# Patient Record
Sex: Female | Born: 1940 | Race: Black or African American | Hispanic: No | Smoking: Never smoker
Health system: Southern US, Community
[De-identification: ages and names within clinical notes are randomized; demographics above are authoritative.]

## PROBLEM LIST (undated history)

## (undated) DIAGNOSIS — E785 Hyperlipidemia, unspecified: Secondary | ICD-10-CM

## (undated) DIAGNOSIS — E049 Nontoxic goiter, unspecified: Secondary | ICD-10-CM

## (undated) DIAGNOSIS — M25562 Pain in left knee: Secondary | ICD-10-CM

## (undated) DIAGNOSIS — G7100 Muscular dystrophy, unspecified: Secondary | ICD-10-CM

## (undated) DIAGNOSIS — G35 Multiple sclerosis: Secondary | ICD-10-CM

## (undated) DIAGNOSIS — F419 Anxiety disorder, unspecified: Secondary | ICD-10-CM

## (undated) DIAGNOSIS — M25561 Pain in right knee: Secondary | ICD-10-CM

## (undated) DIAGNOSIS — I639 Cerebral infarction, unspecified: Secondary | ICD-10-CM

## (undated) DIAGNOSIS — G8929 Other chronic pain: Secondary | ICD-10-CM

## (undated) DIAGNOSIS — I10 Essential (primary) hypertension: Secondary | ICD-10-CM

## (undated) DIAGNOSIS — R55 Syncope and collapse: Secondary | ICD-10-CM

## (undated) DIAGNOSIS — I219 Acute myocardial infarction, unspecified: Secondary | ICD-10-CM

## (undated) HISTORY — DX: Syncope and collapse: R55

## (undated) HISTORY — PX: ABDOMINAL HYSTERECTOMY: SHX81

---

## 2001-07-10 ENCOUNTER — Emergency Department (HOSPITAL_COMMUNITY): Admission: EM | Admit: 2001-07-10 | Discharge: 2001-07-10 | Payer: Self-pay | Admitting: Emergency Medicine

## 2001-07-10 ENCOUNTER — Encounter: Payer: Self-pay | Admitting: Emergency Medicine

## 2002-07-12 ENCOUNTER — Emergency Department (HOSPITAL_COMMUNITY): Admission: EM | Admit: 2002-07-12 | Discharge: 2002-07-12 | Payer: Self-pay | Admitting: Emergency Medicine

## 2002-12-25 ENCOUNTER — Emergency Department (HOSPITAL_COMMUNITY): Admission: EM | Admit: 2002-12-25 | Discharge: 2002-12-26 | Payer: Self-pay | Admitting: Emergency Medicine

## 2002-12-26 ENCOUNTER — Encounter: Payer: Self-pay | Admitting: Emergency Medicine

## 2003-02-26 ENCOUNTER — Encounter: Admission: RE | Admit: 2003-02-26 | Discharge: 2003-05-27 | Payer: Self-pay | Admitting: Internal Medicine

## 2003-03-21 ENCOUNTER — Emergency Department (HOSPITAL_COMMUNITY): Admission: EM | Admit: 2003-03-21 | Discharge: 2003-03-21 | Payer: Self-pay | Admitting: Emergency Medicine

## 2003-07-24 ENCOUNTER — Other Ambulatory Visit: Admission: RE | Admit: 2003-07-24 | Discharge: 2003-07-24 | Payer: Self-pay | Admitting: Family Medicine

## 2003-07-30 ENCOUNTER — Encounter: Admission: RE | Admit: 2003-07-30 | Discharge: 2003-07-30 | Payer: Self-pay | Admitting: Internal Medicine

## 2003-09-18 ENCOUNTER — Emergency Department (HOSPITAL_COMMUNITY): Admission: AD | Admit: 2003-09-18 | Discharge: 2003-09-18 | Payer: Self-pay | Admitting: Family Medicine

## 2003-09-30 ENCOUNTER — Encounter: Admission: RE | Admit: 2003-09-30 | Discharge: 2003-09-30 | Payer: Self-pay | Admitting: Family Medicine

## 2003-10-22 ENCOUNTER — Encounter: Admission: RE | Admit: 2003-10-22 | Discharge: 2003-10-22 | Payer: Self-pay | Admitting: Family Medicine

## 2003-11-06 ENCOUNTER — Emergency Department (HOSPITAL_COMMUNITY): Admission: EM | Admit: 2003-11-06 | Discharge: 2003-11-06 | Payer: Self-pay | Admitting: Family Medicine

## 2003-11-24 ENCOUNTER — Emergency Department (HOSPITAL_COMMUNITY): Admission: EM | Admit: 2003-11-24 | Discharge: 2003-11-25 | Payer: Self-pay | Admitting: Emergency Medicine

## 2003-11-26 ENCOUNTER — Ambulatory Visit (HOSPITAL_COMMUNITY): Admission: RE | Admit: 2003-11-26 | Discharge: 2003-11-26 | Payer: Self-pay | Admitting: Family Medicine

## 2003-11-26 ENCOUNTER — Encounter: Admission: RE | Admit: 2003-11-26 | Discharge: 2003-11-26 | Payer: Self-pay | Admitting: Family Medicine

## 2003-12-02 ENCOUNTER — Ambulatory Visit (HOSPITAL_BASED_OUTPATIENT_CLINIC_OR_DEPARTMENT_OTHER): Admission: RE | Admit: 2003-12-02 | Discharge: 2003-12-02 | Payer: Self-pay | Admitting: General Surgery

## 2003-12-12 ENCOUNTER — Ambulatory Visit (HOSPITAL_COMMUNITY): Admission: RE | Admit: 2003-12-12 | Discharge: 2003-12-12 | Payer: Self-pay | Admitting: Cardiology

## 2003-12-24 ENCOUNTER — Encounter: Admission: RE | Admit: 2003-12-24 | Discharge: 2003-12-24 | Payer: Self-pay | Admitting: Family Medicine

## 2004-01-29 ENCOUNTER — Encounter: Admission: RE | Admit: 2004-01-29 | Discharge: 2004-01-29 | Payer: Self-pay | Admitting: Sports Medicine

## 2004-02-05 ENCOUNTER — Ambulatory Visit (HOSPITAL_COMMUNITY): Admission: RE | Admit: 2004-02-05 | Discharge: 2004-02-05 | Payer: Self-pay | Admitting: Sports Medicine

## 2004-02-05 ENCOUNTER — Encounter: Admission: RE | Admit: 2004-02-05 | Discharge: 2004-05-05 | Payer: Self-pay | Admitting: Family Medicine

## 2004-02-11 ENCOUNTER — Encounter: Admission: RE | Admit: 2004-02-11 | Discharge: 2004-02-11 | Payer: Self-pay | Admitting: Family Medicine

## 2004-07-15 ENCOUNTER — Emergency Department (HOSPITAL_COMMUNITY): Admission: EM | Admit: 2004-07-15 | Discharge: 2004-07-16 | Payer: Self-pay | Admitting: Emergency Medicine

## 2004-10-15 ENCOUNTER — Encounter: Admission: RE | Admit: 2004-10-15 | Discharge: 2004-10-15 | Payer: Self-pay | Admitting: General Surgery

## 2004-10-18 ENCOUNTER — Ambulatory Visit (HOSPITAL_COMMUNITY): Admission: RE | Admit: 2004-10-18 | Discharge: 2004-10-18 | Payer: Self-pay | Admitting: General Surgery

## 2004-10-18 ENCOUNTER — Ambulatory Visit (HOSPITAL_BASED_OUTPATIENT_CLINIC_OR_DEPARTMENT_OTHER): Admission: RE | Admit: 2004-10-18 | Discharge: 2004-10-18 | Payer: Self-pay | Admitting: General Surgery

## 2005-04-14 ENCOUNTER — Emergency Department (HOSPITAL_COMMUNITY): Admission: EM | Admit: 2005-04-14 | Discharge: 2005-04-14 | Payer: Self-pay | Admitting: Family Medicine

## 2005-05-05 ENCOUNTER — Emergency Department (HOSPITAL_COMMUNITY): Admission: EM | Admit: 2005-05-05 | Discharge: 2005-05-05 | Payer: Self-pay | Admitting: Family Medicine

## 2005-05-06 ENCOUNTER — Encounter: Admission: RE | Admit: 2005-05-06 | Discharge: 2005-05-06 | Payer: Self-pay | Admitting: Family Medicine

## 2006-03-13 ENCOUNTER — Emergency Department (HOSPITAL_COMMUNITY): Admission: EM | Admit: 2006-03-13 | Discharge: 2006-03-13 | Payer: Self-pay | Admitting: Emergency Medicine

## 2006-04-10 ENCOUNTER — Emergency Department (HOSPITAL_COMMUNITY): Admission: EM | Admit: 2006-04-10 | Discharge: 2006-04-11 | Payer: Self-pay | Admitting: Emergency Medicine

## 2006-09-19 ENCOUNTER — Emergency Department (HOSPITAL_COMMUNITY): Admission: EM | Admit: 2006-09-19 | Discharge: 2006-09-19 | Payer: Self-pay | Admitting: Emergency Medicine

## 2006-11-09 DIAGNOSIS — E7800 Pure hypercholesterolemia, unspecified: Secondary | ICD-10-CM | POA: Insufficient documentation

## 2006-11-09 DIAGNOSIS — I10 Essential (primary) hypertension: Secondary | ICD-10-CM

## 2006-11-09 DIAGNOSIS — M129 Arthropathy, unspecified: Secondary | ICD-10-CM | POA: Insufficient documentation

## 2007-05-27 ENCOUNTER — Emergency Department (HOSPITAL_COMMUNITY): Admission: EM | Admit: 2007-05-27 | Discharge: 2007-05-27 | Payer: Self-pay | Admitting: Emergency Medicine

## 2011-06-24 LAB — URINALYSIS, ROUTINE W REFLEX MICROSCOPIC
Hgb urine dipstick: NEGATIVE
Nitrite: NEGATIVE
Protein, ur: NEGATIVE
Specific Gravity, Urine: 1.009
Urobilinogen, UA: 0.2

## 2011-06-24 LAB — CBC
HCT: 38.3
Platelets: 159
RDW: 15.3 — ABNORMAL HIGH

## 2011-06-24 LAB — I-STAT 8, (EC8 V) (CONVERTED LAB)
BUN: 9
Bicarbonate: 29.5 — ABNORMAL HIGH
Chloride: 104
Glucose, Bld: 227 — ABNORMAL HIGH
HCT: 42
Hemoglobin: 14.3
Operator id: 257131
Potassium: 3 — ABNORMAL LOW
Sodium: 140

## 2011-06-24 LAB — DIFFERENTIAL
Basophils Absolute: 0
Lymphocytes Relative: 40
Lymphs Abs: 2.4
Monocytes Absolute: 0.3
Neutro Abs: 3.2

## 2011-06-24 LAB — POCT I-STAT CREATININE: Creatinine, Ser: 0.7

## 2012-02-15 ENCOUNTER — Encounter (HOSPITAL_COMMUNITY): Payer: Self-pay | Admitting: *Deleted

## 2012-02-15 ENCOUNTER — Emergency Department (HOSPITAL_COMMUNITY): Payer: Medicare Other

## 2012-02-15 ENCOUNTER — Inpatient Hospital Stay (HOSPITAL_COMMUNITY)
Admission: EM | Admit: 2012-02-15 | Discharge: 2012-02-20 | DRG: 065 | Disposition: A | Discharge status: Skilled Nursing Facility | Payer: Medicare Other | Attending: Internal Medicine | Admitting: Internal Medicine

## 2012-02-15 DIAGNOSIS — R9431 Abnormal electrocardiogram [ECG] [EKG]: Secondary | ICD-10-CM | POA: Diagnosis present

## 2012-02-15 DIAGNOSIS — E079 Disorder of thyroid, unspecified: Secondary | ICD-10-CM | POA: Diagnosis present

## 2012-02-15 DIAGNOSIS — R531 Weakness: Secondary | ICD-10-CM | POA: Diagnosis present

## 2012-02-15 DIAGNOSIS — R5381 Other malaise: Secondary | ICD-10-CM

## 2012-02-15 DIAGNOSIS — I10 Essential (primary) hypertension: Secondary | ICD-10-CM

## 2012-02-15 DIAGNOSIS — I5032 Chronic diastolic (congestive) heart failure: Secondary | ICD-10-CM | POA: Diagnosis present

## 2012-02-15 DIAGNOSIS — I6350 Cerebral infarction due to unspecified occlusion or stenosis of unspecified cerebral artery: Principal | ICD-10-CM | POA: Diagnosis present

## 2012-02-15 DIAGNOSIS — E119 Type 2 diabetes mellitus without complications: Secondary | ICD-10-CM | POA: Diagnosis present

## 2012-02-15 DIAGNOSIS — G35 Multiple sclerosis: Secondary | ICD-10-CM | POA: Diagnosis present

## 2012-02-15 DIAGNOSIS — I509 Heart failure, unspecified: Secondary | ICD-10-CM | POA: Diagnosis present

## 2012-02-15 DIAGNOSIS — E7800 Pure hypercholesterolemia, unspecified: Secondary | ICD-10-CM | POA: Diagnosis present

## 2012-02-15 DIAGNOSIS — I161 Hypertensive emergency: Secondary | ICD-10-CM | POA: Diagnosis present

## 2012-02-15 DIAGNOSIS — E785 Hyperlipidemia, unspecified: Secondary | ICD-10-CM | POA: Diagnosis present

## 2012-02-15 DIAGNOSIS — E1165 Type 2 diabetes mellitus with hyperglycemia: Secondary | ICD-10-CM

## 2012-02-15 DIAGNOSIS — E875 Hyperkalemia: Secondary | ICD-10-CM | POA: Diagnosis present

## 2012-02-15 DIAGNOSIS — R5383 Other fatigue: Secondary | ICD-10-CM

## 2012-02-15 DIAGNOSIS — M129 Arthropathy, unspecified: Secondary | ICD-10-CM

## 2012-02-15 DIAGNOSIS — R600 Localized edema: Secondary | ICD-10-CM

## 2012-02-15 HISTORY — DX: Nontoxic goiter, unspecified: E04.9

## 2012-02-15 HISTORY — DX: Multiple sclerosis: G35

## 2012-02-15 HISTORY — DX: Essential (primary) hypertension: I10

## 2012-02-15 HISTORY — DX: Hyperlipidemia, unspecified: E78.5

## 2012-02-15 LAB — URINALYSIS, ROUTINE W REFLEX MICROSCOPIC
Bilirubin Urine: NEGATIVE
Glucose, UA: 500 mg/dL — AB
Ketones, ur: NEGATIVE mg/dL
Leukocytes, UA: NEGATIVE
Specific Gravity, Urine: 1.019 (ref 1.005–1.030)
pH: 8 (ref 5.0–8.0)

## 2012-02-15 LAB — POCT I-STAT TROPONIN I: Troponin i, poc: 0 ng/mL (ref 0.00–0.08)

## 2012-02-15 LAB — COMPREHENSIVE METABOLIC PANEL
ALT: 9 U/L (ref 0–35)
Alkaline Phosphatase: 60 U/L (ref 39–117)
CO2: 32 mEq/L (ref 19–32)
Calcium: 9.5 mg/dL (ref 8.4–10.5)
GFR calc Af Amer: >90 mL/min (ref >90)
GFR calc non Af Amer: 89 mL/min — ABNORMAL LOW (ref >90)
Glucose, Bld: 184 mg/dL — ABNORMAL HIGH (ref 70–99)
Potassium: 3.3 mEq/L — ABNORMAL LOW (ref 3.5–5.1)
Sodium: 140 mEq/L (ref 135–145)
Total Protein: 7.5 g/dL (ref 6.0–8.3)

## 2012-02-15 LAB — CBC
HCT: 40.1 % (ref 36.0–46.0)
Hemoglobin: 13.7 g/dL (ref 12.0–15.0)
MCHC: 34.2 g/dL (ref 30.0–36.0)
MCV: 84.4 fL (ref 78.0–100.0)
RBC: 4.75 MIL/uL (ref 3.87–5.11)
RDW: 13.6 % (ref 11.5–15.5)

## 2012-02-15 LAB — DIFFERENTIAL
Eosinophils Relative: 1 % (ref 0–5)
Lymphocytes Relative: 33 % (ref 12–46)
Lymphs Abs: 2.1 10*3/uL (ref 0.7–4.0)
Monocytes Absolute: 0.3 10*3/uL (ref 0.1–1.0)

## 2012-02-15 MED ORDER — SODIUM CHLORIDE 0.9 % IV BOLUS (SEPSIS)
500.0000 mL | Freq: Once | INTRAVENOUS | Status: AC
  Administered 2012-02-15: 500 mL via INTRAVENOUS

## 2012-02-15 MED ORDER — FUROSEMIDE 10 MG/ML IJ SOLN
40.0000 mg | Freq: Once | INTRAMUSCULAR | Status: AC
  Administered 2012-02-15: 40 mg via INTRAVENOUS
  Filled 2012-02-15: qty 4

## 2012-02-15 MED ORDER — HYDRALAZINE HCL 20 MG/ML IJ SOLN
10.0000 mg | Freq: Once | INTRAMUSCULAR | Status: AC
  Administered 2012-02-15: 10 mg via INTRAVENOUS
  Filled 2012-02-15: qty 0.5

## 2012-02-15 MED ORDER — NITROGLYCERIN 2 % TD OINT
0.5000 [in_us] | TOPICAL_OINTMENT | Freq: Once | TRANSDERMAL | Status: AC
  Administered 2012-02-15: 0.5 [in_us] via TOPICAL
  Filled 2012-02-15: qty 30

## 2012-02-15 NOTE — ED Notes (Signed)
Per ems: pt called from home. Pt is having right shoulder. ems states pt's right shoulder pain has been ongoing for 3 years. Pt states she was using her right arm and started to increase with pain. Pt does ambulate with walker. ems states pt was in a motorize wheelchair when they arrive. Alert and oriented x4

## 2012-02-15 NOTE — H&P (Addendum)
Chief Complaint:  Generalized weakness 1 day  HPI: 71 year old elderly female with a history multiple sclerosis and hypertension and diabetes who presents emergency Department with one-day history of generalized weakness. She normally walks pretty well but started getting weak last night and woke up this morning and didn't have the strength to walk. She checked her blood pressure yesterday and was over 190 which she says is high for her. She denies any headache. She denies any slurred speech. She denies any difficulty in moving her arms or legs. She denies any numbness or tingling anywhere. She denies any fevers, cough, chest pain, dysuria or any recent illnesses including nausea vomiting or diarrhea. She denies any vision changes. She denies any lower extremity edema.  Review of Systems:  Otherwise negative  Past Medical History: Past Medical History  Diagnosis Date  . Hypertension   . MS (multiple sclerosis)   . Diabetes mellitus    History reviewed. No pertinent past surgical history.  Medications: Prior to Admission medications   Medication Sig Start Date End Date Taking? Authorizing Provider  nebivolol (BYSTOLIC) 10 MG tablet Take 10 mg by mouth daily.   Yes Historical Provider, MD  potassium chloride (MICRO-K) 10 MEQ CR capsule Take 10 mEq by mouth 2 (two) times daily.   Yes Historical Provider, MD    Allergies:   Allergies  Allergen Reactions  . Sulfa Drugs Cross Reactors     dizziness    Social History:  reports that she has never smoked. She does not have any smokeless tobacco history on file. She reports that she does not drink alcohol or use illicit drugs.  Family History: History reviewed. No pertinent family history.  Physical Exam: Filed Vitals:   02/15/12 2033 02/15/12 2229 02/15/12 2231 02/15/12 2251  BP: 162/74 227/92 223/102 195/126  Pulse: 60 60 62 84  Temp:      TempSrc:      Resp: 16     SpO2: 97%      General appearance: alert, cooperative and  no distress Lungs: clear to auscultation bilaterally Heart: regular rate and rhythm, S1, S2 normal, no murmur, click, rub or gallop Abdomen: soft, non-tender; bowel sounds normal; no masses,  no organomegaly Extremities: extremities normal, atraumatic, no cyanosis or edema Pulses: 2+ and symmetric Skin: Skin color, texture, turgor normal. No rashes or lesions Neurologic: Grossly normal    Labs on Admission:   [facility] 02/15/12 2145  NA 140  K 3.3*  CL 100  CO2 32  GLUCOSE 184*  BUN 8  CREATININE 0.61  CALCIUM 9.5  MG --  PHOS --    Basename 02/15/12 2145  AST 16  ALT 9  ALKPHOS 60  BILITOT 0.3  PROT 7.5  ALBUMIN 3.5    Basename 02/15/12 2145  WBC 6.3  NEUTROABS 3.8  HGB 13.7  HCT 40.1  MCV 84.4  PLT 185    Radiological Exams on Admission: Dg Chest 2 View  02/15/2012  *RADIOLOGY REPORT*  Clinical Data: Weakness.  CHEST - 2 VIEW  Comparison: 04/10/2006.  Findings: The heart is mildly enlarged but stable.  There is tortuosity of the thoracic aorta.  The lungs are clear of acute process.  Mild chronic bronchitic changes.  No pleural effusion or pneumothorax.  The bony thorax is intact.  IMPRESSION: Mild cardiac enlargement, stable. Chronic bronchitic type changes but no acute pulmonary findings.  Original Report Authenticated By: P. Loralie Champagne, M.D.   Dg Shoulder Right  02/15/2012  *RADIOLOGY REPORT*  Clinical Data:  Right shoulder pain.  RIGHT SHOULDER - 2+ VIEW  Comparison: None  Findings: The joint spaces are maintained.  Mild glenohumeral and AC joint degenerative changes.  No acute fracture or abnormal soft tissue calcifications.  The right lung apex is clear.  The right upper ribs are intact.  IMPRESSION: Mild degenerative changes but no fracture or dislocation.  Original Report Authenticated By: P. Loralie Champagne, M.D.    Assessment/Plan Present on Admission:  71 year old female with generalized weakness with systolic blood pressures in the 240 range    .DIABETES MELLITUS II, UNCOMPLICATED .HYPERTENSION, BENIGN SYSTEMIC .Weakness generalized .Hypertensive emergency .HYPERCHOLESTEROLEMIA .Multiple sclerosis  She is being given a dose of Lasix, nitroglycerin paste, and hydralazine in emergency department in her systolic blood pressures are still over 220. Avoid increasing nitroglycerin paste 1 inch and also increase her when necessary hydralazine. I'm in a peripheral metoprolol 50 mg twice a day which can be titrated up. Will check a 2-D echo of her heart and serial her cardiac enzymes. Place on telemetry. EKG shows no acute changes. CT of her head is pending however she has no focal neurological deficits. We'll also obtain a physical therapy consultation and evaluation. She likely has some diastolic dysfunction and has been given a dose of Lasix we'll not continue Lasix as patient clinically is not in congestive heart failure at this time.   Lejla Moeser A [phone] 02/15/2012, 10:58 PM   Pt in ED after receiving 1/2# ntg paste, hydralazine 10 mg iv and lasix had a brief sudden drop in BP to 60-70 range with brief vasovagal response syncopal episode with quick increase of BP to over 150 range per ED RN update.  My plan initially was going to increase bp meds due to persistent sbp over 200 and dbp over 115.  Now with this sudden drop in bp will stop all BP meds ntg paste has been taken off chest wall in ED.  Apparently labile BP, will have to institute meds at lower dose and slow step wise fashion.  Ct head is still pending.

## 2012-02-15 NOTE — ED Notes (Signed)
Bed:WA17<BR> Expected date:<BR> Expected time:<BR> Means of arrival:<BR> Comments:<BR> EMS

## 2012-02-15 NOTE — ED Provider Notes (Addendum)
History     CSN: [id number]  Arrival date & time 02/15/12  Hannah Lamb   First MD Initiated Contact with Patient 02/15/12 1957      Chief Complaint  Patient presents with  . Shoulder Pain    (Consider location/radiation/quality/duration/timing/severity/associated sxs/prior treatment) HPI Pt presents with 1 day history of generalized weakness. States she began feeling unwell last night and this morning was weak. No fever, chills, CP, SOB, cough, abd pain, numbness, focal weakness, visual changes. + urinary frequency. + R shoulder pain which is unchanged for several years.  Past Medical History  Diagnosis Date  . Hypertension   . MS (multiple sclerosis)   . Diabetes mellitus     History reviewed. No pertinent past surgical history.  History reviewed. No pertinent family history.  History  Substance Use Topics  . Smoking status: Never Smoker   . Smokeless tobacco: Not on file  . Alcohol Use: No    OB History    Grav Para Term Preterm Abortions TAB SAB Ect Mult Living                  Review of Systems  Constitutional: Positive for fatigue. Negative for fever, chills and diaphoresis.  HENT: Negative for sore throat, neck pain and neck stiffness.   Eyes: Negative for pain and visual disturbance.  Respiratory: Negative for cough, shortness of breath and wheezing.   Cardiovascular: Positive for leg swelling. Negative for chest pain and palpitations.  Gastrointestinal: Negative for nausea, vomiting, abdominal pain, diarrhea and constipation.  Genitourinary: Positive for frequency. Negative for dysuria and flank pain.  Neurological: Positive for weakness. Negative for dizziness, light-headedness, numbness and headaches.    Allergies  Sulfa drugs cross reactors  Home Medications   Current Outpatient Rx  Name Route Sig Dispense Refill  . NEBIVOLOL HCL 10 MG PO TABS Oral Take 10 mg by mouth daily.    Marland Kitchen POTASSIUM CHLORIDE ER 10 MEQ PO CPCR Oral Take 10 mEq by mouth 2 (two)  times daily.      BP 195/126  Pulse 84  Temp(Src) 99.4 F (37.4 C) (Oral)  Resp 16  SpO2 97%  Physical Exam  Nursing note and vitals reviewed. Constitutional: She is oriented to person, place, and time. She appears well-developed and well-nourished. No distress.  HENT:  Head: Normocephalic and atraumatic.  Mouth/Throat: Oropharynx is clear and moist.  Eyes: EOM are normal. Pupils are equal, round, and reactive to light.  Neck: Normal range of motion. Neck supple.  Cardiovascular: Normal rate and regular rhythm.   Pulmonary/Chest: Effort normal and breath sounds normal. No respiratory distress. She has no wheezes. She has no rales. She exhibits no tenderness.  Abdominal: Soft. Bowel sounds are normal. There is no tenderness. There is no rebound and no guarding.  Musculoskeletal: Normal range of motion. She exhibits edema (1+ bl pitting edema).       Tenderness over shoulder joint and with ROM. Normal strength. No deformity or injury  Neurological: She is alert and oriented to person, place, and time.       5/5 motor in all ext, sensation intact.   Skin: Skin is warm and dry. No rash noted. No erythema.  Psychiatric: She has a normal mood and affect. Her behavior is normal.    ED Course  Procedures (including critical care time)  Labs Reviewed  COMPREHENSIVE METABOLIC PANEL - Abnormal; Notable for the following:    Potassium 3.3 (*)    Glucose, Bld 184 (*)  GFR calc non Af Amer 89 (*)    All other components within normal limits  URINALYSIS, ROUTINE W REFLEX MICROSCOPIC - Abnormal; Notable for the following:    Glucose, UA 500 (*)    All other components within normal limits  PRO B NATRIURETIC PEPTIDE - Abnormal; Notable for the following:    Pro B Natriuretic peptide (BNP) 1818.0 (*)    All other components within normal limits  CBC  DIFFERENTIAL  POCT I-STAT TROPONIN I   Dg Chest 2 View  02/15/2012  *RADIOLOGY REPORT*  Clinical Data: Weakness.  CHEST - 2 VIEW   Comparison: 04/10/2006.  Findings: The heart is mildly enlarged but stable.  There is tortuosity of the thoracic aorta.  The lungs are clear of acute process.  Mild chronic bronchitic changes.  No pleural effusion or pneumothorax.  The bony thorax is intact.  IMPRESSION: Mild cardiac enlargement, stable. Chronic bronchitic type changes but no acute pulmonary findings.  Original Report Authenticated By: P. Loralie Champagne, M.D.   Dg Shoulder Right  02/15/2012  *RADIOLOGY REPORT*  Clinical Data: Right shoulder pain.  RIGHT SHOULDER - 2+ VIEW  Comparison: None  Findings: The joint spaces are maintained.  Mild glenohumeral and AC joint degenerative changes.  No acute fracture or abnormal soft tissue calcifications.  The right lung apex is clear.  The right upper ribs are intact.  IMPRESSION: Mild degenerative changes but no fracture or dislocation.  Original Report Authenticated By: P. Loralie Champagne, M.D.     1. Uncontrolled hypertension   2. Pedal edema   3. Generalized weakness      Date: 02/15/2012  Rate: 61  Rhythm: normal sinus rhythm  QRS Axis: normal  Intervals: normal  ST/T Wave abnormalities: nonspecific T wave changes  Conduction Disutrbances:none  Narrative Interpretation:   Old EKG Reviewed: changes noted Poor quality EKG. +artifact   MDM    Discussed with [facility]. Would like to get stat CT head in ED and will admit to telemetry bed   Loren Racer, MD 02/15/12 2257   11:49 PM Pt with vagal episode in ED. BP dropped into 60's and HR into 50's. Pt has diuresed significant amount > 1 liter which may be the cause of the vagal episode. SBp back to 110 and pt mentating well. NTG removed from chest  Loren Racer, MD 02/15/12 [phone]

## 2012-02-16 ENCOUNTER — Inpatient Hospital Stay (HOSPITAL_COMMUNITY): Payer: Medicare Other

## 2012-02-16 ENCOUNTER — Encounter (HOSPITAL_COMMUNITY): Payer: Self-pay | Admitting: Cardiology

## 2012-02-16 DIAGNOSIS — R5383 Other fatigue: Secondary | ICD-10-CM

## 2012-02-16 DIAGNOSIS — R9431 Abnormal electrocardiogram [ECG] [EKG]: Secondary | ICD-10-CM

## 2012-02-16 DIAGNOSIS — E1165 Type 2 diabetes mellitus with hyperglycemia: Secondary | ICD-10-CM

## 2012-02-16 DIAGNOSIS — I10 Essential (primary) hypertension: Secondary | ICD-10-CM

## 2012-02-16 DIAGNOSIS — G35 Multiple sclerosis: Secondary | ICD-10-CM

## 2012-02-16 DIAGNOSIS — R55 Syncope and collapse: Secondary | ICD-10-CM

## 2012-02-16 DIAGNOSIS — R5381 Other malaise: Secondary | ICD-10-CM

## 2012-02-16 LAB — CBC
HCT: 42 % (ref 36.0–46.0)
MCV: 83.7 fL (ref 78.0–100.0)
RBC: 5.02 MIL/uL (ref 3.87–5.11)
WBC: 8.5 10*3/uL (ref 4.0–10.5)

## 2012-02-16 LAB — CARDIAC PANEL(CRET KIN+CKTOT+MB+TROPI)
CK, MB: 2.7 ng/mL (ref 0.3–4.0)
CK, MB: 2.4 ng/mL (ref 0.3–4.0)
Relative Index: 2.1 (ref 0.0–2.5)
Relative Index: 1.5 (ref 0.0–2.5)
Relative Index: 1.1 (ref 0.0–2.5)
Total CK: 130 U/L (ref 7–177)
Total CK: 165 U/L (ref 7–177)
Troponin I: <0.3 ng/mL
Troponin I: <0.3 ng/mL

## 2012-02-16 LAB — GLUCOSE, CAPILLARY
Glucose-Capillary: 254 mg/dL — ABNORMAL HIGH (ref 70–99)
Glucose-Capillary: 67 mg/dL — ABNORMAL LOW (ref 70–99)
Glucose-Capillary: 243 mg/dL — ABNORMAL HIGH (ref 70–99)
Glucose-Capillary: 251 mg/dL — ABNORMAL HIGH (ref 70–99)

## 2012-02-16 LAB — BASIC METABOLIC PANEL
CO2: 30 mEq/L (ref 19–32)
Chloride: 98 mEq/L (ref 96–112)
Sodium: 139 mEq/L (ref 135–145)

## 2012-02-16 LAB — POTASSIUM: Potassium: 3.5 mEq/L (ref 3.5–5.1)

## 2012-02-16 MED ORDER — INSULIN ASPART 100 UNIT/ML ~~LOC~~ SOLN
0.0000 [IU] | Freq: Three times a day (TID) | SUBCUTANEOUS | Status: DC
  Administered 2012-02-16: 7 [IU] via SUBCUTANEOUS
  Administered 2012-02-16: 5 [IU] via SUBCUTANEOUS

## 2012-02-16 MED ORDER — ASPIRIN 81 MG PO CHEW
81.0000 mg | CHEWABLE_TABLET | Freq: Once | ORAL | Status: DC
  Filled 2012-02-16: qty 1

## 2012-02-16 MED ORDER — GADOBENATE DIMEGLUMINE 529 MG/ML IV SOLN
17.0000 mL | Freq: Once | INTRAVENOUS | Status: AC | PRN
  Administered 2012-02-16: 17 mL via INTRAVENOUS

## 2012-02-16 MED ORDER — POTASSIUM CHLORIDE CRYS ER 20 MEQ PO TBCR
40.0000 meq | EXTENDED_RELEASE_TABLET | Freq: Once | ORAL | Status: AC
  Administered 2012-02-16: 40 meq via ORAL
  Filled 2012-02-16: qty 2

## 2012-02-16 MED ORDER — SODIUM CHLORIDE 0.9 % IJ SOLN: 3.0000 mL | Freq: Two times a day (BID) | INTRAMUSCULAR | Status: DC

## 2012-02-16 MED ORDER — SODIUM CHLORIDE 0.9 % IJ SOLN: 3.0000 mL | INTRAMUSCULAR | Status: DC | PRN

## 2012-02-16 MED ORDER — SODIUM CHLORIDE 0.9 % IV SOLN
250.0000 mL | INTRAVENOUS | Status: DC | PRN
  Administered 2012-02-16: 250 mL via INTRAVENOUS

## 2012-02-16 MED ORDER — NEBIVOLOL HCL 10 MG PO TABS
10.0000 mg | ORAL_TABLET | Freq: Every day | ORAL | Status: DC
  Administered 2012-02-16 – 2012-02-20 (×5): 10 mg via ORAL
  Filled 2012-02-16 (×5): qty 1

## 2012-02-16 MED ORDER — INSULIN ASPART PROT & ASPART (70-30 MIX) 100 UNIT/ML ~~LOC~~ SUSP
30.0000 [IU] | Freq: Two times a day (BID) | SUBCUTANEOUS | Status: DC
  Administered 2012-02-16: 30 [IU] via SUBCUTANEOUS
  Filled 2012-02-16: qty 10

## 2012-02-16 MED ORDER — INSULIN ASPART 100 UNIT/ML ~~LOC~~ SOLN
4.0000 [IU] | Freq: Once | SUBCUTANEOUS | Status: AC
  Administered 2012-02-17: 4 [IU] via SUBCUTANEOUS

## 2012-02-16 MED ORDER — POTASSIUM CHLORIDE CRYS ER 20 MEQ PO TBCR
20.0000 meq | EXTENDED_RELEASE_TABLET | Freq: Every day | ORAL | Status: DC
  Administered 2012-02-17 – 2012-02-20 (×4): 20 meq via ORAL
  Filled 2012-02-16 (×4): qty 1

## 2012-02-16 MED ORDER — ASPIRIN 81 MG PO CHEW
243.0000 mg | CHEWABLE_TABLET | Freq: Once | ORAL | Status: AC
  Administered 2012-02-16: 243 mg via ORAL
  Filled 2012-02-16: qty 3

## 2012-02-16 MED ORDER — SODIUM CHLORIDE 0.9 % IJ SOLN
3.0000 mL | Freq: Two times a day (BID) | INTRAMUSCULAR | Status: DC
  Administered 2012-02-16 – 2012-02-19 (×8): 3 mL via INTRAVENOUS

## 2012-02-16 MED ORDER — ASPIRIN EC 81 MG PO TBEC
81.0000 mg | DELAYED_RELEASE_TABLET | Freq: Every day | ORAL | Status: DC
  Administered 2012-02-16 – 2012-02-17 (×2): 81 mg via ORAL
  Filled 2012-02-16 (×2): qty 1

## 2012-02-16 MED ORDER — POTASSIUM CHLORIDE 10 MEQ/100ML IV SOLN
10.0000 meq | INTRAVENOUS | Status: AC
  Administered 2012-02-16 (×4): 10 meq via INTRAVENOUS
  Filled 2012-02-16 (×4): qty 100

## 2012-02-16 NOTE — Progress Notes (Signed)
Nutrition Note:    Pt triggered on the nutrition risk report due to difficulty chewing and swallowing.    Spoke with pt and daughters.  Pt usually ate healthy prior to admit.  Eats a lot of fruits and veges, usually does not use much salt, limits sugar/sweets.  Chewing problems per pt due to poor dentition.  Swallowing problems at times where food gets "hung up".    Eating well here.  Enjoying meals here with good po per pt.  Diet:  Heart Healthy  Chart reviewed.  Pt with hx MS, HTN, DM.  BMI 29.8  Daughter reported noted recent fluid retention and had questions regarding this.  Educated pt and daughters on heart healthy diabetic healthy eating.  Written info with Rd name and number provided.  No further questions at this time.    Continue current diet.  Encouraged ordering soft foods.  Monitor as needed throughout stay.  Oran Rein, RD [phone]

## 2012-02-16 NOTE — Consult Note (Signed)
Reason for Consult:Progressive generalized weakness Referring Physician: Thedore Mins  CC: Unable to do for herself  HPI: Hannah Lamb is an 71 y.o. female who reports that even as a youngster has always had some functional limitations.  Was unable to jump rope and stand for long periods of time.  Has had long standing difficulties with her right arm.  In 2008 was hospitalized and a work done at [facility] that included an LP led to a diagnosis of MS.  She was doing fairly well and due to age was not started on any disease modifying therapy.  In December had a syncopal episode that she describes as a prolonged apneic spell requiring CPR by her daughter.  After that event had worsening trouble with her RUE but did not seek medical attention.  She slowly improved and was able to ambulate wit ha walker and handle all of her ADL's including some light cooking.  In the 24 hours prior to presentation the patient became progressively weaker to the point that she was unable to ambulate or attend to her own self care.  Patient was brought in for evaluation at that time.  BP was elevated on initial presentation but has improved since that time.    Past Medical History  Diagnosis Date  . Hypertension   . MS (multiple sclerosis)   . Diabetes mellitus     History reviewed. No pertinent past surgical history.  History reviewed. No pertinent family history.  Social History:  reports that she has never smoked. She does not have any smokeless tobacco history on file. She reports that she does not drink alcohol or use illicit drugs.  Allergies  Allergen Reactions  . Sulfa Drugs Cross Reactors     dizziness    Medications:  I have reviewed the patient's current medications. Prior to Admission:  Prescriptions prior to admission  Medication Sig Dispense Refill  . insulin NPH-insulin regular (NOVOLIN 70/30) (70-30) 100 UNIT/ML injection Inject 45 Units into the skin daily with breakfast.      . insulin NPH-insulin  regular (NOVOLIN 70/30) (70-30) 100 UNIT/ML injection Inject 30 Units into the skin daily with supper.      . nebivolol (BYSTOLIC) 10 MG tablet Take 10 mg by mouth daily.      . potassium chloride (MICRO-K) 10 MEQ CR capsule Take 10 mEq by mouth 2 (two) times daily.       Scheduled:   . aspirin EC  81 mg Oral Daily  . furosemide  40 mg Intravenous Once  . hydrALAZINE  10 mg Intravenous Once  . insulin aspart  0-9 Units Subcutaneous TID WC  . insulin aspart protamine-insulin aspart  30 Units Subcutaneous BID WC  . nebivolol  10 mg Oral Daily  . nitroGLYCERIN  0.5 inch Topical Once  . potassium chloride  10 mEq Intravenous Q1 Hr x 4  . potassium chloride  20 mEq Oral Daily  . potassium chloride  40 mEq Oral Once  . sodium chloride  500 mL Intravenous Once  . sodium chloride  3 mL Intravenous Q12H  . DISCONTD: sodium chloride  3 mL Intravenous Q12H    ROS: History obtained from the patient  General ROS: negative for - chills, fatigue, fever, night sweats, weight gain or weight loss Psychological ROS: negative for - behavioral disorder, hallucinations, memory difficulties, mood swings or suicidal ideation Ophthalmic ROS: negative for - blurry vision, double vision, eye pain or loss of vision ENT ROS: negative for - epistaxis, nasal discharge,  oral lesions, sore throat, tinnitus or vertigo Allergy and Immunology ROS: negative for - hives or itchy/watery eyes Hematological and Lymphatic ROS: negative for - bleeding problems, bruising or swollen lymph nodes Endocrine ROS: negative for - galactorrhea, hair pattern changes, polydipsia/polyuria or temperature intolerance Respiratory ROS: negative for - cough, hemoptysis, shortness of breath or wheezing Cardiovascular ROS: negative for - chest pain, dyspnea on exertion, edema or irregular heartbeat Gastrointestinal ROS: negative for - abdominal pain, diarrhea, hematemesis, nausea/vomiting or stool incontinence Genito-Urinary ROS: negative for  - dysuria, hematuria, incontinence or urinary frequency/urgency Musculoskeletal ROS: negative for - joint swelling or muscular weakness Neurological ROS: as noted in HPI Dermatological ROS: negative for rash and skin lesion changes  Physical Examination: Blood pressure 145/75, pulse 71, temperature 98.5 F (36.9 C), temperature source Oral, resp. rate 20, height 5\' 7"  (1.702 m), weight 86.1 kg (189 lb 13.1 oz), SpO2 98.00%.  Neurologic Examination Mental Status: Alert, oriented, tangential.  Speech fluent without evidence of aphasia.  Able to follow 3 step commands without difficulty. Cranial Nerves: II: visual fields grossly normal, pupils equal, round, reactive to light and accommodation III,IV, VI: ptosis not present, extra-ocular motions intact bilaterally V,VII: smile symmetric, facial light touch sensation normal bilaterally VIII: hearing normal bilaterally IX,X: gag reflex present XI: trapezius strength/neck flexion strength normal bilaterally XII: tongue strength normal  Motor: Right : Upper extremity  Decreased ROM at shoulder w/3-4/5 hand grip   Left:     Upper extremity   5/5  Lower extremity   5-/5          Lower extremity   5-/5 Tone and bulk:normal tone throughout; no atrophy noted Sensory: Pinprick and light touch intact throughout, bilaterally Deep Tendon Reflexes: 2+ in the upper extremities, trace at the knees and absent at the ankles Plantars: Right: equivocal   Left: downgoing Cerebellar: normal finger-to-nose and normal heel-to-shin test  Results for orders placed during the hospital encounter of 02/15/12 (from the past 48 hour(s))  CBC     Status: Normal   Collection Time   02/15/12  9:45 PM      Component Value Range Comment   WBC 6.3  4.0 - 10.5 (K/uL)    RBC 4.75  3.87 - 5.11 (MIL/uL)    Hemoglobin 13.7  12.0 - 15.0 (g/dL)    HCT 36.6  44.0 - 34.7 (%)    MCV 84.4  78.0 - 100.0 (fL)    MCH 28.8  26.0 - 34.0 (pg)    MCHC 34.2  30.0 - 36.0 (g/dL)    RDW  42.5  95.6 - 38.7 (%)    Platelets 185  150 - 400 (K/uL)   DIFFERENTIAL     Status: Normal   Collection Time   02/15/12  9:45 PM      Component Value Range Comment   Neutrophils Relative 61  43 - 77 (%)    Neutro Abs 3.8  1.7 - 7.7 (K/uL)    Lymphocytes Relative 33  12 - 46 (%)    Lymphs Abs 2.1  0.7 - 4.0 (K/uL)    Monocytes Relative 5  3 - 12 (%)    Monocytes Absolute 0.3  0.1 - 1.0 (K/uL)    Eosinophils Relative 1  0 - 5 (%)    Eosinophils Absolute 0.1  0.0 - 0.7 (K/uL)    Basophils Relative 0  0 - 1 (%)    Basophils Absolute 0.0  0.0 - 0.1 (K/uL)   COMPREHENSIVE METABOLIC PANEL  Status: Abnormal   Collection Time   02/15/12  9:45 PM      Component Value Range Comment   Sodium 140  135 - 145 (mEq/L)    Potassium 3.3 (*) 3.5 - 5.1 (mEq/L)    Chloride 100  96 - 112 (mEq/L)    CO2 32  19 - 32 (mEq/L)    Glucose, Bld 184 (*) 70 - 99 (mg/dL)    BUN 8  6 - 23 (mg/dL)    Creatinine, Ser 0.86  0.50 - 1.10 (mg/dL)    Calcium 9.5  8.4 - 10.5 (mg/dL)    Total Protein 7.5  6.0 - 8.3 (g/dL)    Albumin 3.5  3.5 - 5.2 (g/dL)    AST 16  0 - 37 (U/L)    ALT 9  0 - 35 (U/L)    Alkaline Phosphatase 60  39 - 117 (U/L)    Total Bilirubin 0.3  0.3 - 1.2 (mg/dL)    GFR calc non Af Amer 89 (*) >90 (mL/min)    GFR calc Af Amer >90  >90 (mL/min)   PRO B NATRIURETIC PEPTIDE     Status: Abnormal   Collection Time   02/15/12  9:45 PM      Component Value Range Comment   Pro B Natriuretic peptide (BNP) 1818.0 (*) 0 - 125 (pg/mL)   URINALYSIS, ROUTINE W REFLEX MICROSCOPIC     Status: Abnormal   Collection Time   02/15/12  9:54 PM      Component Value Range Comment   Color, Urine YELLOW  YELLOW     APPearance CLEAR  CLEAR     Specific Gravity, Urine 1.019  1.005 - 1.030     pH 8.0  5.0 - 8.0     Glucose, UA 500 (*) NEGATIVE (mg/dL)    Hgb urine dipstick NEGATIVE  NEGATIVE     Bilirubin Urine NEGATIVE  NEGATIVE     Ketones, ur NEGATIVE  NEGATIVE (mg/dL)    Protein, ur NEGATIVE  NEGATIVE (mg/dL)     Urobilinogen, UA 0.2  0.0 - 1.0 (mg/dL)    Nitrite NEGATIVE  NEGATIVE     Leukocytes, UA NEGATIVE  NEGATIVE  MICROSCOPIC NOT DONE ON URINES WITH NEGATIVE PROTEIN, BLOOD, LEUKOCYTES, NITRITE, OR GLUCOSE <1000 mg/dL.  POCT I-STAT TROPONIN I     Status: Normal   Collection Time   02/15/12  9:55 PM      Component Value Range Comment   Troponin i, poc 0.00  0.00 - 0.08 (ng/mL)    Comment 3            CARDIAC PANEL(CRET KIN+CKTOT+MB+TROPI)     Status: Normal   Collection Time   02/16/12  2:00 AM      Component Value Range Comment   Total CK 130  7 - 177 (U/L)    CK, MB 2.7  0.3 - 4.0 (ng/mL)    Troponin I <0.30  <0.30 (ng/mL)    Relative Index 2.1  0.0 - 2.5    BASIC METABOLIC PANEL     Status: Abnormal   Collection Time   02/16/12  2:00 AM      Component Value Range Comment   Sodium 139  135 - 145 (mEq/L)    Potassium 2.6 (*) 3.5 - 5.1 (mEq/L)    Chloride 98  96 - 112 (mEq/L)    CO2 30  19 - 32 (mEq/L)    Glucose, Bld 215 (*) 70 - 99 (mg/dL)  BUN 7  6 - 23 (mg/dL)    Creatinine, Ser 6.96  0.50 - 1.10 (mg/dL)    Calcium 9.0  8.4 - 10.5 (mg/dL)    GFR calc non Af Amer 84 (*) >90 (mL/min)    GFR calc Af Amer >90  >90 (mL/min)   CBC     Status: Normal   Collection Time   02/16/12  2:00 AM      Component Value Range Comment   WBC 8.5  4.0 - 10.5 (K/uL)    RBC 5.02  3.87 - 5.11 (MIL/uL)    Hemoglobin 14.4  12.0 - 15.0 (g/dL)    HCT 29.5  28.4 - 13.2 (%)    MCV 83.7  78.0 - 100.0 (fL)    MCH 28.7  26.0 - 34.0 (pg)    MCHC 34.3  30.0 - 36.0 (g/dL)    RDW 44.0  10.2 - 72.5 (%)    Platelets 174  150 - 400 (K/uL)   MAGNESIUM     Status: Normal   Collection Time   02/16/12  2:00 AM      Component Value Range Comment   Magnesium 1.5  1.5 - 2.5 (mg/dL)   GLUCOSE, CAPILLARY     Status: Abnormal   Collection Time   02/16/12  7:48 AM      Component Value Range Comment   Glucose-Capillary 341 (*) 70 - 99 (mg/dL)   CARDIAC PANEL(CRET KIN+CKTOT+MB+TROPI)     Status: Normal   Collection Time    02/16/12  9:59 AM      Component Value Range Comment   Total CK 165  7 - 177 (U/L)    CK, MB 2.4  0.3 - 4.0 (ng/mL)    Troponin I <0.30  <0.30 (ng/mL)    Relative Index 1.5  0.0 - 2.5      No results found for this or any previous visit (from the past 240 hour(s)).  Dg Chest 2 View  02/15/2012  *RADIOLOGY REPORT*  Clinical Data: Weakness.  CHEST - 2 VIEW  Comparison: 04/10/2006.  Findings: The heart is mildly enlarged but stable.  There is tortuosity of the thoracic aorta.  The lungs are clear of acute process.  Mild chronic bronchitic changes.  No pleural effusion or pneumothorax.  The bony thorax is intact.  IMPRESSION: Mild cardiac enlargement, stable. Chronic bronchitic type changes but no acute pulmonary findings.  Original Report Authenticated By: P. Loralie Champagne, M.D.   Dg Shoulder Right  02/15/2012  *RADIOLOGY REPORT*  Clinical Data: Right shoulder pain.  RIGHT SHOULDER - 2+ VIEW  Comparison: None  Findings: The joint spaces are maintained.  Mild glenohumeral and AC joint degenerative changes.  No acute fracture or abnormal soft tissue calcifications.  The right lung apex is clear.  The right upper ribs are intact.  IMPRESSION: Mild degenerative changes but no fracture or dislocation.  Original Report Authenticated By: P. Loralie Champagne, M.D.   Ct Head Wo Contrast  02/16/2012  *RADIOLOGY REPORT*  Clinical Data: Weakness.  CT HEAD WITHOUT CONTRAST  Technique:  Contiguous axial images were obtained from the base of the skull through the vertex without contrast.  Comparison: Head CT 05/27/2007.  Brain MRI 05/27/2007.  Findings: The there are multiple well defined foci of low attenuation throughout the basal ganglia bilaterally, similar to the prior study from 05/27/2007, compatible with old lacunar infarctions.  Mild cerebral and cerebellar atrophy is noted.  Old cerebellar infarctions in the right cerebellar hemisphere are also noted.  There are extensive patchy and confluent areas of decreased  attenuation throughout the deep and periventricular white matter of the cerebral hemispheres bilaterally, consistent with chronic microvascular ischemic disease.  No definite acute intracranial abnormalities.  Specifically, no overt signs of acute/subacute cerebral ischemia, no acute intracranial hemorrhage, no focal mass, mass effect or hydrocephalous.  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  No acute displaced skull fractures are identified.  Visualized paranasal sinuses and mastoids are well pneumatized.  IMPRESSION: 1.  No acute intracranial abnormalities. 2.  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  In addition, there is extensive chronic microvascular ischemic disease of the white matter. 3.  Mild cerebral and cerebellar atrophy.  Original Report Authenticated By: Florencia Reasons, M.D.     Assessment/Plan:  Patient Active Hospital Problem List:  Weakness generalized (02/15/2012)   Assessment: Unclear etiology for weakness.  Patient feels it is more pronounced on the right although this is not particularly apparent on examination.  Further work up indicated.     Plan:  1.  MRI of the brain and cervical spine with and without contrast. Multiple sclerosis (02/15/2012)   Assessment: Unclear if progression at this time.  Based on results of above imaging will determine need for steroids.     Plan: F/u results   Thana Farr, MD [facility] [phone] 02/16/2012, 11:49 AM

## 2012-02-16 NOTE — Progress Notes (Signed)
Inpatient Diabetes Program Recommendations  AACE/ADA: New Consensus Statement on Inpatient Glycemic Control (2009)  Target Ranges:  Prepandial:   less than 140 mg/dL      Peak postprandial:   less than 180 mg/dL (1-2 hours)      Critically ill patients:  140 - 180 mg/dL   Reason for Visit: Elevated CBG's and referral.  Saw patient and daughters.  According to RN they were concerned that patient was on too much insulin prior to admit.  Patient's A1C=10.5% indicating sub-optimal control prior to admission. Shared this information with patient and family.  Radiology came to take patient to MRI.  Daughters would like coordinator to come by tomorrow.  Briefly was able to discuss 70/30 insulin regimen and the importance of eating consistently.  RN also stated that patient is weary of bing on Novolog insulin b/c of "tingling in fingers" she experienced in past.  She is not having these effects today.  Will follow.  CBG down to 254 mg/dL at lunch.

## 2012-02-16 NOTE — Progress Notes (Signed)
I have directly reviewed the clinical findings, lab, imaging studies and management of this patient in detail. I have interviewed and examined the patient and agree with the documentation,  as recorded by the Physician extender.  Acute on chronic generalized weakness worse in lower extremities, could be MS flareup, PT OT and neurology requested to see the patient.  Hypokalemia replace aggressively recheck potassium and magnesium later today.  EKG changes upon admission, ruled out for MI, and a new beta blocker and aspirin obtain echo and cardiology input.  Diabetes mellitus resume home dose insulin along with sliding scale.  Episode of syncope in the ER due to rapid reduction of blood pressure tended to multiple medications, PT symptom-free now blood pressure medications have been adjusted blood pressure is stable.   Leroy Sea M.D on 02/16/2012 at 1:16 PM  [facility] Office  [phone]

## 2012-02-16 NOTE — Progress Notes (Signed)
CBG: 67 at 1811  Treatment: Dinner tray given to pt at this time  Symptoms: none  Follow-up CBG: Time:1856 CBG Result 183  Possible Reasons for Event: Insulin Regimen; pt on SSI TID with meals and Novolog 70/30 BID  Comments/MD notified: Dr. Thedore Mins notified and orders given to d/c SSI and to hold evening dose of 70/30 and to check CBG after pt eats dinner and to check CBG Q2hour x4.      Arta [facility]

## 2012-02-16 NOTE — Evaluation (Signed)
Physical Therapy Evaluation Patient Details Name: Hannah Lamb MRN: [id number] DOB: [birth year] Today's Date: 02/16/2012 Time: 3086-5784 PT Time Calculation (min): 49 min  PT Assessment / Plan / Recommendation Clinical Impression  71 yo female admitted with Hypertensive crisis. Pt also has hx of MS with possibiltiy of acute MS exacerbation. Pt demonstrates general weakness in addition to balance, perceptual awareness deficits. Pt demonstrates high risk for falls due to weakness and inability to self-correct posture, righting. Discussed D/c options with family. Feel pt could benefit greatly from CIR consult and continued therapy. Family does not want to place pt in skilled nursing facility. If CIR not an option, then family wishes to take pt home.     PT Assessment  Patient needs continued PT services    Follow Up Recommendations  Inpatient Rehab    Barriers to Discharge        lEquipment Recommendations   (to be determined)    Recommendations for Other Services OT consult   Frequency Min 3X/week    Precautions / Restrictions Precautions Precautions: Fall Precaution Comments: R shoulder deficits PTA. Family present and requested limited pulling up on R UE when mobilizing pt Restrictions Weight Bearing Restrictions: No   Pertinent Vitals/Pain       Mobility  Bed Mobility Bed Mobility: Supine to Sit Supine to Sit: 1: +2 Total assist;HOB flat Supine to Sit: Patient Percentage: 40% Details for Bed Mobility Assistance: Assist for bil LEs off bed and trunk to upright. Increased time. Repeated cueing for initiation and completion of task. Pt easily distracted at times.  Transfers Transfers: Sit to Stand;Stand to Sit Sit to Stand: 1: +2 Total assist;From bed;From elevated surface Sit to Stand: Patient Percentage: 50% Stand to Sit: 1: +2 Total assist;To chair/3-in-1;With upper extremity assist Stand to Sit: Patient Percentage: 50% Details for Transfer Assistance: VCs safety,  technique, hand placement. Assist to rise, stabilize, control dsecent. Sat EOB for 2-3 minutes before able to attempt transfer due to pt leaning moderately posteriorly and requiring assist and Max cues for correct posture Ambulation/Gait Ambulation/Gait Assistance: 1: +2 Total assist Ambulation/Gait: Patient Percentage: 50% Ambulation Distance (Feet): 6 Feet Assistive device: Rolling walker Ambulation/Gait Assistance Details: Multimodal cues for safety, technique, posture, sequencing. Assist to stabilize and keep pt in correct posture/alignment. Pt leaning moderately to L side with head tilted to L as well. Pt with difficulty advancing L LE. Fatgiues easily. Towards end of walk, when advancing R LE, pt appeared to overshoot step. Gait Pattern: Step-to pattern;Lateral trunk lean to left;Ataxic;Decreased stride length    Exercises     PT Diagnosis: Difficulty walking;Abnormality of gait;Generalized weakness  PT Problem List: Decreased strength;Decreased activity tolerance;Decreased balance;Decreased mobility;Decreased knowledge of use of DME;Decreased coordination PT Treatment Interventions: DME instruction;Gait training;Functional mobility training;Therapeutic activities;Therapeutic exercise;Balance training;Patient/family education   PT Goals Acute Rehab PT Goals PT Goal Formulation: With patient/family Time For Goal Achievement: 03/01/12 Potential to Achieve Goals: Good Pt will go Supine/Side to Sit: with min assist PT Goal: Supine/Side to Sit - Progress: Goal set today Pt will go Sit to Supine/Side: with min assist PT Goal: Sit to Supine/Side - Progress: Goal set today Pt will go Sit to Stand: with min assist PT Goal: Sit to Stand - Progress: Goal set today Pt will Transfer Bed to Chair/Chair to Bed: with min assist PT Transfer Goal: Bed to Chair/Chair to Bed - Progress: Goal set today Pt will Ambulate: 51 - 150 feet;with min assist;with least restrictive assistive device PT Goal:  Ambulate - Progress:  Goal set today  Visit Information  Last PT Received On: 02/16/12 Assistance Needed: +2    Subjective Data  Subjective: "i was doing everything for myself" Patient Stated Goal: Get better. Home   Prior Functioning  Home Living Lives With: Daughter Available Help at Discharge: Family Home Adaptive Equipment: Walker - rolling;Shower chair with back Academic librarian) Prior Function Level of Independence: Independent with assistive device(s) Comments: using RW at baseline without assistance Communication Communication: No difficulties    Cognition  Overall Cognitive Status: Impaired Area of Impairment: Attention;Problem solving;Awareness of errors Arousal/Alertness: Awake/alert Behavior During Session: [facility] for tasks performed Current Attention Level: Alternating Awareness of Errors: Assistance required to correct errors made;Assistance required to identify errors made Cognition - Other Comments: Increased time for processing    Extremity/Trunk Assessment Right Lower Extremity Assessment RLE ROM/Strength/Tone: Deficits RLE ROM/Strength/Tone Deficits: Strength at least 4/5 throughout RLE Sensation: WFL - Light Touch RLE Coordination: Deficits Left Lower Extremity Assessment LLE ROM/Strength/Tone: Deficits LLE ROM/Strength/Tone Deficits: Strength at least 3+/5 throughout LLE Sensation: WFL - Light Touch LLE Coordination: Deficits Trunk Assessment Trunk Assessment: Other exceptions Trunk Exceptions: Head tilted laterally in sitting/standing. Multimodal cues for correction-pt did not appear to be able to correct on her own.    Balance Balance Balance Assessed: Yes Static Sitting Balance Static Sitting - Balance Support: Bilateral upper extremity supported Static Sitting - Level of Assistance: 3: Mod assist;4: Min assist Static Sitting - Comment/# of Minutes: Sat EOB 2-3 minutes working on correction of head/trunk posturing. Pt leaning posteriorly and laterally  to L. Had pt work on anterior weightshifting by sliding bil hands down legs then returning to full upright. Pt unable to gauge correct posture-retuned to posterior leaning each time. Therapist positioned her body in front of pt to give visual cue for right alignment.  Static Standing Balance Static Standing - Balance Support: Bilateral upper extremity supported Static Standing - Level of Assistance: 1: +2 Total assist Static Standing - Comment/# of Minutes: Pt=50%. Stood statically for 1-2 minutes, again working on alignment. Pt stated she felt she was falling backwards, even through she was not. Poor perceptual awareness of body in space.   End of Session PT - End of Session Equipment Utilized During Treatment: Gait belt Activity Tolerance: Patient tolerated treatment well Patient left: in chair;with call bell/phone within reach;with family/visitor present   Rebeca Alert [facility] 02/16/2012, 4:01 PM [phone]

## 2012-02-16 NOTE — Progress Notes (Signed)
Subjective: Awake alert. Reports feeling better than yesterday. Denied pain/discomfort. Family at bedside.   Objective: Vital signs Filed Vitals:   02/15/12 2330 02/15/12 2345 02/16/12 0102 02/16/12 0501  BP: 199/97 103/58 184/82 145/75  Pulse:  66 64 71  Temp:   98.2 F (36.8 C) 98.5 F (36.9 C)  TempSrc:   Oral Oral  Resp:  21 20 20   Height:   5\' 7"  (1.702 m)   Weight:   86.1 kg (189 lb 13.1 oz)   SpO2:  96% 98% 98%   Weight change:  Last BM Date: 02/16/12  Intake/Output from previous day: 06/05 0701 - 06/06 0700 In: 344 [P.O.:240; IV Piggyback:104] Out: 2276 [Urine:2275; Stool:1] Total I/O In: 360 [P.O.:360] Out: -    Physical Exam: General: Alert, awake, oriented x3, in no acute distress. Well nourished HEENT: No bruits, no goiter. Normocephalic, PERRL, mucus membranes mouth moist/pink.  Heart: Regular rate and rhythm, without murmurs, rubs, gallops. Trace LEE PPP Lungs:  Normal effort. Breath sounds clear to auscultation bilaterally. No wheeze, rhonchi Abdomen: Soft, nontender, nondistended, positive bowel sounds. Extremities: No clubbing cyanosis with positive pedal pulses. Neuro: Grossly intact, nonfocal. Speech slow but clear. Cranial nerve II-XII intact.     Lab Results: Basic Metabolic Panel:  Basename 02/16/12 0200 02/15/12 2145  NA 139 140  K 2.6* 3.3*  CL 98 100  CO2 30 32  GLUCOSE 215* 184*  BUN 7 8  CREATININE 0.73 0.61  CALCIUM 9.0 9.5  MG 1.5 --  PHOS -- --   Liver Function Tests:  [facility] 02/15/12 2145  AST 16  ALT 9  ALKPHOS 60  BILITOT 0.3  PROT 7.5  ALBUMIN 3.5   No results found for this basename: LIPASE:2,AMYLASE:2 in the last 72 hours No results found for this basename: AMMONIA:2 in the last 72 hours CBC:  Basename 02/16/12 0200 02/15/12 2145  WBC 8.5 6.3  NEUTROABS -- 3.8  HGB 14.4 13.7  HCT 42.0 40.1  MCV 83.7 84.4  PLT 174 185   Cardiac Enzymes:  Basename 02/16/12 0959 02/16/12 0200  CKTOTAL 165 130  CKMB  2.4 2.7  CKMBINDEX -- --  TROPONINI <0.30 <0.30   BNP:  Basename 02/15/12 2145  PROBNP 1818.0*   D-Dimer: No results found for this basename: DDIMER:2 in the last 72 hours CBG:  Basename 02/16/12 1126 02/16/12 0748  GLUCAP 254* 341*   Hemoglobin A1C: No results found for this basename: HGBA1C in the last 72 hours Fasting Lipid Panel: No results found for this basename: CHOL,HDL,LDLCALC,TRIG,CHOLHDL,LDLDIRECT in the last 72 hours Thyroid Function Tests: No results found for this basename: TSH,T4TOTAL,FREET4,T3FREE,THYROIDAB in the last 72 hours Anemia Panel: No results found for this basename: VITAMINB12,FOLATE,FERRITIN,TIBC,IRON,RETICCTPCT in the last 72 hours Coagulation: No results found for this basename: LABPROT:2,INR:2 in the last 72 hours Urine Drug Screen: Drugs of Abuse  No results found for this basename: labopia, cocainscrnur, labbenz, amphetmu, thcu, labbarb    Alcohol Level: No results found for this basename: ETH:2 in the last 72 hours Urinalysis:  Basename 02/15/12 2154  COLORURINE YELLOW  LABSPEC 1.019  PHURINE 8.0  GLUCOSEU 500*  HGBUR NEGATIVE  BILIRUBINUR NEGATIVE  KETONESUR NEGATIVE  PROTEINUR NEGATIVE  UROBILINOGEN 0.2  NITRITE NEGATIVE  LEUKOCYTESUR NEGATIVE   Misc. Labs:  No results found for this or any previous visit (from the past 240 hour(s)).  Studies/Results: Dg Chest 2 View  02/15/2012  *RADIOLOGY REPORT*  Clinical Data: Weakness.  CHEST - 2 VIEW  Comparison: 04/10/2006.  Findings:  The heart is mildly enlarged but stable.  There is tortuosity of the thoracic aorta.  The lungs are clear of acute process.  Mild chronic bronchitic changes.  No pleural effusion or pneumothorax.  The bony thorax is intact.  IMPRESSION: Mild cardiac enlargement, stable. Chronic bronchitic type changes but no acute pulmonary findings.  Original Report Authenticated By: P. Loralie Champagne, M.D.   Dg Shoulder Right  02/15/2012  *RADIOLOGY REPORT*  Clinical  Data: Right shoulder pain.  RIGHT SHOULDER - 2+ VIEW  Comparison: None  Findings: The joint spaces are maintained.  Mild glenohumeral and AC joint degenerative changes.  No acute fracture or abnormal soft tissue calcifications.  The right lung apex is clear.  The right upper ribs are intact.  IMPRESSION: Mild degenerative changes but no fracture or dislocation.  Original Report Authenticated By: P. Loralie Champagne, M.D.   Ct Head Wo Contrast  02/16/2012  *RADIOLOGY REPORT*  Clinical Data: Weakness.  CT HEAD WITHOUT CONTRAST  Technique:  Contiguous axial images were obtained from the base of the skull through the vertex without contrast.  Comparison: Head CT 05/27/2007.  Brain MRI 05/27/2007.  Findings: The there are multiple well defined foci of low attenuation throughout the basal ganglia bilaterally, similar to the prior study from 05/27/2007, compatible with old lacunar infarctions.  Mild cerebral and cerebellar atrophy is noted.  Old cerebellar infarctions in the right cerebellar hemisphere are also noted.  There are extensive patchy and confluent areas of decreased attenuation throughout the deep and periventricular white matter of the cerebral hemispheres bilaterally, consistent with chronic microvascular ischemic disease.  No definite acute intracranial abnormalities.  Specifically, no overt signs of acute/subacute cerebral ischemia, no acute intracranial hemorrhage, no focal mass, mass effect or hydrocephalous.  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  No acute displaced skull fractures are identified.  Visualized paranasal sinuses and mastoids are well pneumatized.  IMPRESSION: 1.  No acute intracranial abnormalities. 2.  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  In addition, there is extensive chronic microvascular ischemic disease of the white matter. 3.  Mild cerebral and cerebellar atrophy.  Original Report  Authenticated By: Florencia Reasons, M.D.    Medications: Scheduled Meds:   . aspirin EC  81 mg Oral Daily  . furosemide  40 mg Intravenous Once  . hydrALAZINE  10 mg Intravenous Once  . insulin aspart  0-9 Units Subcutaneous TID WC  . insulin aspart protamine-insulin aspart  30 Units Subcutaneous BID WC  . nebivolol  10 mg Oral Daily  . nitroGLYCERIN  0.5 inch Topical Once  . potassium chloride  10 mEq Intravenous Q1 Hr x 4  . potassium chloride  20 mEq Oral Daily  . potassium chloride  40 mEq Oral Once  . sodium chloride  500 mL Intravenous Once  . sodium chloride  3 mL Intravenous Q12H  . DISCONTD: sodium chloride  3 mL Intravenous Q12H   Continuous Infusions:  PRN Meds:.sodium chloride, sodium chloride  Assessment/Plan:  Principal Problem:  *Hypertensive emergency: etiology unclear but likely related to MS exacerbation. Much improved since lasix and NTG and hydralazine given in ED. Home bystolic resumed. 2 decho results pending. CE negative. Volume status neg 1.5L. Will monitor closely.  Active Problems:  DIABETES MELLITUS II, UNCOMPLICATED: will check HgA1c. CBG 341-254. Continue home 70/30 at lower dose. Request diabetic coordinator consult as pt verbalizes confusion regarding management.  Hypokalemia: likely related to lasix received in ED. Will  replete and recheck. Will check mag level as well.   HYPERCHOLESTEROLEMIA  HYPERTENSION, BENIGN SYSTEMIC: see #1  Weakness generalized: likely related to #1 and decreased po intake prior to admission. Will request PT/OT eval.   Multiple sclerosis: stable.  Dispo: lives at home with daughter. Hopefully home 24-48 hours.     LOS: 1 day   [facility] M 02/16/2012, 12:46 PM

## 2012-02-16 NOTE — Consult Note (Signed)
CARDIOLOGY CONSULT NOTE  Patient ID: Hannah Lamb MRN: [id number] DOB/AGE: [birth year] 72 y.o.  Admit date: 02/15/2012 Primary Physician  Dr. August Luz Primary Cardiologist None Chief Complaint  Abnormal EKG  HPI: The patient has a history of hypertension.  She presented to the ER with weakness.  At the time her BP was recorded as 195/126.  She was treated with IV lasix and NTG paste and hydralazine. Also, he had a significant diuresis followed by an episode of hypotension (SBP 60) and bradycardia (rate 50s).  Because of her weakness neurology was consulted.  She does have a history of multiple sclerosis.  We are called because of syncope and an abnormal EKG.    The she has had a progressive weakness.  She usually gets around with a walker or scooter but she has had more difficulty doing this recently.  She did not realize there her BP was elevated.  She denies chest pain, neck or arm pain.  She does have some right shoulder pain but this has been chronic for months.  She denies acute SOB, PND or orthopnea.  She has had no fevers or chills.  She has had no weight gain or edema.  There was a reported episode of apnea and LOC last last year.  Her daughter said that she wasn't breathing and the daughter called 911.  She started CPR but her mother had a spontaneous return of respiration and EMS did not suggest transport.  She has had no similar events before or since.   Past Medical History  Diagnosis Date  . Hypertension   . MS (multiple sclerosis)   . Diabetes mellitus   . Goiter   . Hyperlipidemia     Past Surgical History  Procedure Date  . Abdominal hysterectomy     Allergies  Allergen Reactions  . Sulfa Drugs Cross Reactors     dizziness   Prescriptions prior to admission  Medication Sig Dispense Refill  . insulin NPH-insulin regular (NOVOLIN 70/30) (70-30) 100 UNIT/ML injection Inject 45 Units into the skin daily with breakfast.      . insulin NPH-insulin regular (NOVOLIN 70/30)  (70-30) 100 UNIT/ML injection Inject 30 Units into the skin daily with supper.      . nebivolol (BYSTOLIC) 10 MG tablet Take 10 mg by mouth daily.      . potassium chloride (MICRO-K) 10 MEQ CR capsule Take 10 mEq by mouth 2 (two) times daily.       Family History  Problem Relation Age of Onset  . Diabetes Father     History   Social History  . Marital Status: Widowed    Spouse Name: N/A    Number of Children: N/A  . Years of Education: N/A   Occupational History  . Not on file.   Social History Main Topics  . Smoking status: Never Smoker   . Smokeless tobacco: Not on file  . Alcohol Use: No  . Drug Use: No  . Sexually Active: No   Other Topics Concern  . Not on file   Social History Narrative   Lives with daughter.     ROS:  As stated in the HPI and negative for all other systems.  Physical Exam: Blood pressure 155/78, pulse 70, temperature 98.2 F (36.8 C), temperature source Oral, resp. rate 20, height 5\' 7"  (1.702 m), weight 189 lb 13.1 oz (86.1 kg), SpO2 98.00%.  GENERAL:  Well appearing HEENT:  Pupils equal round and reactive, fundi not visualized, oral mucosa  unremarkable, edentulous NECK:  No jugular venous distention, waveform within normal limits, carotid upstroke brisk and symmetric, no bruits, no thyromegaly LYMPHATICS:  No cervical, inguinal adenopathy LUNGS:  Clear to auscultation bilaterally BACK:  No CVA tenderness CHEST:  Unremarkable HEART:  PMI not displaced or sustained,S1 and S2 within normal limits, no S3, no S4, no clicks, no rubs, apical and axillary mid to early systolic murmur ABD:  Flat, positive bowel sounds normal in frequency in pitch, no bruits, no rebound, no guarding, no midline pulsatile mass, no hepatomegaly, no splenomegaly EXT:  2 plus pulses upper, diminished DP/PT, no edema, no cyanosis no clubbing SKIN:  No rashes no nodules NEURO:  Cranial nerves II through XII grossly intact, motor weak lower greater than upper PSYCH:   Cognitively intact, oriented to person place and time  Labs: Lab Results  Component Value Date   BUN 7 02/16/2012   Lab Results  Component Value Date   CREATININE 0.73 02/16/2012   Lab Results  Component Value Date   NA 139 02/16/2012   K 3.5 02/16/2012   CL 98 02/16/2012   CO2 30 02/16/2012   Lab Results  Component Value Date   CKTOTAL 165 02/16/2012   CKMB 2.4 02/16/2012   TROPONINI <0.30 02/16/2012   Lab Results  Component Value Date   WBC 8.5 02/16/2012   HGB 14.4 02/16/2012   HCT 42.0 02/16/2012   MCV 83.7 02/16/2012   PLT 174 02/16/2012    Lab Results  Component Value Date   ALT 9 02/15/2012   AST 16 02/15/2012   ALKPHOS 60 02/15/2012   BILITOT 0.3 02/15/2012     Radiology:      CXR:  Mild cardiac enlargement, stable.  Chronic bronchitic type changes but no acute pulmonary findings.  Head CT:  1. No acute intracranial abnormalities.  2. 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 In addition, there is  extensive chronic microvascular ischemic disease of the white  matter.  3. Mild cerebral and cerebellar atrophy.    EKG:  NSR rate 51 QTc prolonged, T wave inversion inferolateral leads. LAD.  Slight worsening of T wave inversion and prolongation of QT from 02/15/12 to 02/16/12.  No distant EKG.    ASSESSMENT AND PLAN:   1)  Abnormal EKG:  The patient has no history consistent with ischemia.  These EKG findings certainly can indicate ischemia but also can be nonspecific and related to electrolyte or even acute neurologic disease.  I agree with checking an echocardiogram and correcting electrolytes as you are doing.  It would be helpful to get an old EKG.  I doubt that an ischemia work up will be indicated.  2)  Elevated proBNP:  Below 10,000 this lab is nonspecific for heart failure.  This remains a clinical diagnosis.  Check echo.  By exam she appears to be euvolemic.    3)  HTN:  Per primary team.    4)  Syncope:  AMS in the ER  was related to medications.  She did have an episode of apparent apnea with question syncope late last year.  However, the description of this is vague and no acute treatment was sought.  No further work up is indicated other than above.  5) Murmur:  Suspect mild AS.  This will be evaluated with the echocardiogram.  Signed: Rollene Rotunda 02/16/2012, 3:43 PM

## 2012-02-16 NOTE — Progress Notes (Signed)
UR complete 

## 2012-02-16 NOTE — ED Notes (Signed)
Pt stated to family that she had a headache, felt nauseated, and her right leg felt numb. Pt's family then came out of the room requesting staff to help and stated "something is wrong she's not responding to [place]". Dr. Ranae Palms was notified immediately and came to bedside to assess pt. At this time pt had eyes open but speech was slow. Pt's BP was reading 68/48. Nitro was removed from chest at this time. Pt's BP began to rise and Pt returned to baseline without any interventions. Pt is now alert and oriented x4. Admitting MD notified of incident.

## 2012-02-16 NOTE — Progress Notes (Signed)
CRITICAL VALUE ALERT  Critical value received:  Potassium =2.6  Date of notification:  02/16/12  Time of notification:  0248  Critical value read back:yes  Nurse who received alert:  Michaelle Copas  MD notified (1st page):  Donnamarie Poag  Time of first page:  0249  MD notified (2nd page):  Time of second page:  Responding MD:  Orders entered  Time MD responded:

## 2012-02-17 ENCOUNTER — Inpatient Hospital Stay (HOSPITAL_COMMUNITY): Payer: Medicare Other

## 2012-02-17 DIAGNOSIS — G35 Multiple sclerosis: Secondary | ICD-10-CM

## 2012-02-17 DIAGNOSIS — I10 Essential (primary) hypertension: Secondary | ICD-10-CM

## 2012-02-17 DIAGNOSIS — I6340 Cerebral infarction due to embolism of unspecified cerebral artery: Secondary | ICD-10-CM

## 2012-02-17 DIAGNOSIS — E1165 Type 2 diabetes mellitus with hyperglycemia: Secondary | ICD-10-CM

## 2012-02-17 DIAGNOSIS — I369 Nonrheumatic tricuspid valve disorder, unspecified: Secondary | ICD-10-CM

## 2012-02-17 DIAGNOSIS — R5383 Other fatigue: Secondary | ICD-10-CM

## 2012-02-17 DIAGNOSIS — R5381 Other malaise: Secondary | ICD-10-CM

## 2012-02-17 LAB — GLUCOSE, CAPILLARY
Glucose-Capillary: 195 mg/dL — ABNORMAL HIGH (ref 70–99)
Glucose-Capillary: 183 mg/dL — ABNORMAL HIGH (ref 70–99)
Glucose-Capillary: 254 mg/dL — ABNORMAL HIGH (ref 70–99)

## 2012-02-17 LAB — LIPID PANEL
HDL: 47 mg/dL (ref >39)
LDL Cholesterol: 132 mg/dL — ABNORMAL HIGH (ref 0–99)

## 2012-02-17 LAB — BASIC METABOLIC PANEL
Chloride: 99 mEq/L (ref 96–112)
Creatinine, Ser: 0.98 mg/dL (ref 0.50–1.10)
GFR calc Af Amer: 66 mL/min — ABNORMAL LOW (ref >90)
Potassium: 3.9 mEq/L (ref 3.5–5.1)

## 2012-02-17 LAB — CBC
Platelets: 150 10*3/uL (ref 150–400)
RDW: 14.3 % (ref 11.5–15.5)
WBC: 6.5 10*3/uL (ref 4.0–10.5)

## 2012-02-17 LAB — MAGNESIUM: Magnesium: 1.5 mg/dL (ref 1.5–2.5)

## 2012-02-17 LAB — TSH: TSH: 0.538 u[IU]/mL (ref 0.350–4.500)

## 2012-02-17 MED ORDER — HYDRALAZINE HCL 20 MG/ML IJ SOLN
5.0000 mg | Freq: Four times a day (QID) | INTRAMUSCULAR | Status: DC | PRN
  Administered 2012-02-17: 5 mg via INTRAVENOUS

## 2012-02-17 MED ORDER — INSULIN ASPART 100 UNIT/ML ~~LOC~~ SOLN
0.0000 [IU] | Freq: Three times a day (TID) | SUBCUTANEOUS | Status: DC
  Administered 2012-02-17: 1 [IU] via SUBCUTANEOUS
  Administered 2012-02-17: 2 [IU] via SUBCUTANEOUS
  Administered 2012-02-18: 6 [IU] via SUBCUTANEOUS
  Administered 2012-02-18: 1 [IU] via SUBCUTANEOUS
  Administered 2012-02-18 – 2012-02-19 (×3): 2 [IU] via SUBCUTANEOUS

## 2012-02-17 MED ORDER — INSULIN ASPART PROT & ASPART (70-30 MIX) 100 UNIT/ML ~~LOC~~ SUSP
20.0000 [IU] | Freq: Every day | SUBCUTANEOUS | Status: DC
  Administered 2012-02-17: 20 [IU] via SUBCUTANEOUS
  Filled 2012-02-17: qty 10

## 2012-02-17 MED ORDER — CLOPIDOGREL BISULFATE 75 MG PO TABS
75.0000 mg | ORAL_TABLET | Freq: Every day | ORAL | Status: DC
  Administered 2012-02-18 – 2012-02-20 (×3): 75 mg via ORAL
  Filled 2012-02-17 (×4): qty 1

## 2012-02-17 MED ORDER — ATORVASTATIN CALCIUM 10 MG PO TABS
10.0000 mg | ORAL_TABLET | Freq: Every day | ORAL | Status: DC
  Administered 2012-02-17: 10 mg via ORAL
  Filled 2012-02-17 (×2): qty 1

## 2012-02-17 MED ORDER — LISINOPRIL 5 MG PO TABS
5.0000 mg | ORAL_TABLET | Freq: Every day | ORAL | Status: DC
  Administered 2012-02-17: 5 mg via ORAL
  Filled 2012-02-17: qty 1

## 2012-02-17 MED ORDER — INSULIN ASPART 100 UNIT/ML ~~LOC~~ SOLN: 4.0000 [IU] | Freq: Once | SUBCUTANEOUS | Status: DC

## 2012-02-17 MED ORDER — HYDRALAZINE HCL 20 MG/ML IJ SOLN
INTRAMUSCULAR | Status: AC
  Filled 2012-02-17: qty 1

## 2012-02-17 NOTE — Progress Notes (Signed)
Occupational Therapy Note Chart reviewed. Attempted to see pt earlier. Pt was getting ready to go for a test and nsg requests OT try back this afternoon. Will try back as schedule allows. Judithann Sauger OTR/L [phone] 02/17/2012

## 2012-02-17 NOTE — Progress Notes (Signed)
  Echocardiogram 2D Echocardiogram has been performed.  Hannah Lamb A 02/17/2012, 10:38 AM

## 2012-02-17 NOTE — Consult Note (Signed)
Physical Medicine and Rehabilitation Consult Reason for Consult: Multiple sclerosis exacerbation Referring Physician: [facility]   HPI: Hannah Lamb is a 71 y.o. right-handed female with history of multiple sclerosis diagnosed 2008 and hypertension admitted 02/15/2012 with generalized weakness to the point she was unable to ambulate. MRI of the brain revealed acute infarct involving the right caudate head and anterior aspect of the right lenticular nucleus with smaller scattered infarcts throughout the right frontal lobe. There are also remote small infarcts of the basal ganglia, thalami and cerebellum bilaterally with prominent white matter type changes fairly confluent in the periventricular region that have progressed since prior examination. MRI cervical spine with no focal cord signal abnormalities. Neurology services consulted with question plan to begin steroid therapy as workup continues. Patient currently has been placed on aspirin therapy. Cardiology services has been consulted with noted history of syncope with carotid Dopplers and echocardiogram pending. Physical therapy evaluation completed 02/16/2012 occupational therapy pending. Recommendations have been made for physical medicine rehabilitation consult to consider inpatient rehabilitation services   Review of Systems  Gastrointestinal: Positive for constipation.  Musculoskeletal: Positive for myalgias and falls.  Neurological: Positive for weakness.  All other systems reviewed and are negative.   Past Medical History  Diagnosis Date  . Hypertension   . MS (multiple sclerosis)   . Diabetes mellitus   . Goiter   . Hyperlipidemia    Past Surgical History  Procedure Date  . Abdominal hysterectomy    Family History  Problem Relation Age of Onset  . Diabetes Father    Social History:  reports that she has never smoked. She does not have any smokeless tobacco history on file. She reports that she does not drink alcohol or use  illicit drugs. Allergies:  Allergies  Allergen Reactions  . Sulfa Drugs Cross Reactors     dizziness   Medications Prior to Admission  Medication Sig Dispense Refill  . insulin NPH-insulin regular (NOVOLIN 70/30) (70-30) 100 UNIT/ML injection Inject 45 Units into the skin daily with breakfast.      . insulin NPH-insulin regular (NOVOLIN 70/30) (70-30) 100 UNIT/ML injection Inject 30 Units into the skin daily with supper.      . nebivolol (BYSTOLIC) 10 MG tablet Take 10 mg by mouth daily.      . potassium chloride (MICRO-K) 10 MEQ CR capsule Take 10 mEq by mouth 2 (two) times daily.        Home: Home Living Lives With: Daughter Available Help at Discharge: Family Home Adaptive Equipment: Walker - rolling;Shower chair with back Academic librarian)  Functional History: Prior Function Comments: using RW at baseline without assistance Functional Status:  Mobility: Bed Mobility Bed Mobility: Supine to Sit Supine to Sit: 1: +2 Total assist;HOB flat Supine to Sit: Patient Percentage: 40% Transfers Transfers: Sit to Stand;Stand to Sit Sit to Stand: 1: +2 Total assist;From bed;From elevated surface Sit to Stand: Patient Percentage: 50% Stand to Sit: 1: +2 Total assist;To chair/3-in-1;With upper extremity assist Stand to Sit: Patient Percentage: 50% Ambulation/Gait Ambulation/Gait Assistance: 1: +2 Total assist Ambulation/Gait: Patient Percentage: 50% Ambulation Distance (Feet): 6 Feet Assistive device: Rolling walker Ambulation/Gait Assistance Details: Multimodal cues for safety, technique, posture, sequencing. Assist to stabilize and keep pt in correct posture/alignment. Pt leaning moderately to L side with head tilted to L as well. Pt with difficulty advancing L LE. Fatgiues easily. Towards end of walk, when advancing R LE, pt appeared to overshoot step. Gait Pattern: Step-to pattern;Lateral trunk lean to left;Ataxic;Decreased stride length  ADL:     Cognition: Cognition Arousal/Alertness: Awake/alert Orientation Level: Oriented X4 Cognition Overall Cognitive Status: Impaired Area of Impairment: Attention;Problem solving;Awareness of errors Arousal/Alertness: Awake/alert Behavior During Session: [facility] for tasks performed Current Attention Level: Alternating Awareness of Errors: Assistance required to correct errors made;Assistance required to identify errors made Cognition - Other Comments: Increased time for processing  Blood pressure 158/77, pulse 49, temperature 97.9 F (36.6 C), temperature source Axillary, resp. rate 20, height 5\' 7"  (1.702 m), weight 86.1 kg (189 lb 13.1 oz), SpO2 98.00%. Physical Exam  Constitutional: She is oriented to person, place, and time. She appears well-developed and well-nourished.  HENT:  Head: Normocephalic and atraumatic.  Right Ear: External ear normal.  Left Ear: External ear normal.  Nose: Nose normal.  Mouth/Throat: Oropharyngeal exudate present.  Eyes: Conjunctivae are normal. Pupils are equal, round, and reactive to light. Right eye exhibits no discharge.  Neck: Neck supple. No thyromegaly present.  Cardiovascular: Normal rate and regular rhythm.   Pulmonary/Chest: Effort normal and breath sounds normal. She has no wheezes.  Abdominal: Bowel sounds are normal. She exhibits no distension. There is no tenderness.  Musculoskeletal: She exhibits no edema.  Neurological: She is alert and oriented to person, place, and time.       Follows three-step commands and appropriate during exam. Slower with left upper extremity movement as well as left leg. Perhaps a little apraxic. Strength grossly 2-3 proximally to 4/5 distally. No gross sensory findings. Smile asymmetrical but not consistent with stroke location. No focal CN otherwise.  Skin: Skin is warm and dry.  Psychiatric: She has a normal mood and affect. Her speech is normal and behavior is normal. Cognition and memory are not impaired. She  exhibits normal recent memory and normal remote memory.    Results for orders placed during the hospital encounter of 02/15/12 (from the past 24 hour(s))  CARDIAC PANEL(CRET KIN+CKTOT+MB+TROPI)     Status: Normal   Collection Time   02/16/12  9:59 AM      Component Value Range   Total CK 165  7 - 177 (U/L)   CK, MB 2.4  0.3 - 4.0 (ng/mL)   Troponin I <0.30  <0.30 (ng/mL)   Relative Index 1.5  0.0 - 2.5   GLUCOSE, CAPILLARY     Status: Abnormal   Collection Time   02/16/12 11:26 AM      Component Value Range   Glucose-Capillary 254 (*) 70 - 99 (mg/dL)  POTASSIUM     Status: Normal   Collection Time   02/16/12  1:29 PM      Component Value Range   Potassium 3.5  3.5 - 5.1 (mEq/L)  GLUCOSE, CAPILLARY     Status: Abnormal   Collection Time   02/16/12  6:11 PM      Component Value Range   Glucose-Capillary 67 (*) 70 - 99 (mg/dL)  CARDIAC PANEL(CRET KIN+CKTOT+MB+TROPI)     Status: Abnormal   Collection Time   02/16/12  6:25 PM      Component Value Range   Total CK 217 (*) 7 - 177 (U/L)   CK, MB 2.4  0.3 - 4.0 (ng/mL)   Troponin I <0.30  <0.30 (ng/mL)   Relative Index 1.1  0.0 - 2.5   GLUCOSE, CAPILLARY     Status: Abnormal   Collection Time   02/16/12  6:57 PM      Component Value Range   Glucose-Capillary 183 (*) 70 - 99 (mg/dL)  GLUCOSE, CAPILLARY  Status: Abnormal   Collection Time   02/16/12  9:11 PM      Component Value Range   Glucose-Capillary 243 (*) 70 - 99 (mg/dL)   Comment 1 Documented in Chart     Comment 2 Notify RN    GLUCOSE, CAPILLARY     Status: Abnormal   Collection Time   02/16/12 11:03 PM      Component Value Range   Glucose-Capillary 251 (*) 70 - 99 (mg/dL)   Comment 1 Documented in Chart     Comment 2 Notify RN    GLUCOSE, CAPILLARY     Status: Abnormal   Collection Time   02/17/12  1:10 AM      Component Value Range   Glucose-Capillary 209 (*) 70 - 99 (mg/dL)  GLUCOSE, CAPILLARY     Status: Abnormal   Collection Time   02/17/12  3:01 AM      Component  Value Range   Glucose-Capillary 203 (*) 70 - 99 (mg/dL)  CBC     Status: Abnormal   Collection Time   02/17/12  4:54 AM      Component Value Range   WBC 6.5  4.0 - 10.5 (K/uL)   RBC 4.22  3.87 - 5.11 (MIL/uL)   Hemoglobin 11.9 (*) 12.0 - 15.0 (g/dL)   HCT 16.1 (*) 09.6 - 46.0 (%)   MCV 85.1  78.0 - 100.0 (fL)   MCH 28.2  26.0 - 34.0 (pg)   MCHC 33.1  30.0 - 36.0 (g/dL)   RDW 04.5  40.9 - 81.1 (%)   Platelets 150  150 - 400 (K/uL)  BASIC METABOLIC PANEL     Status: Abnormal   Collection Time   02/17/12  4:54 AM      Component Value Range   Sodium 137  135 - 145 (mEq/L)   Potassium 3.9  3.5 - 5.1 (mEq/L)   Chloride 99  96 - 112 (mEq/L)   CO2 28  19 - 32 (mEq/L)   Glucose, Bld 207 (*) 70 - 99 (mg/dL)   BUN 16  6 - 23 (mg/dL)   Creatinine, Ser 9.14  0.50 - 1.10 (mg/dL)   Calcium 9.6  8.4 - 78.2 (mg/dL)   GFR calc non Af Amer 57 (*) >90 (mL/min)   GFR calc Af Amer 66 (*) >90 (mL/min)  LIPID PANEL     Status: Abnormal   Collection Time   02/17/12  4:54 AM      Component Value Range   Cholesterol 194  0 - 200 (mg/dL)   Triglycerides 76  <956 (mg/dL)   HDL 47  >21 (mg/dL)   Total CHOL/HDL Ratio 4.1     VLDL 15  0 - 40 (mg/dL)   LDL Cholesterol 308 (*) 0 - 99 (mg/dL)  GLUCOSE, CAPILLARY     Status: Abnormal   Collection Time   02/17/12  5:31 AM      Component Value Range   Glucose-Capillary 195 (*) 70 - 99 (mg/dL)  GLUCOSE, CAPILLARY     Status: Abnormal   Collection Time   02/17/12  7:45 AM      Component Value Range   Glucose-Capillary 183 (*) 70 - 99 (mg/dL)   Comment 1 Notify RN     Dg Chest 2 View  02/15/2012  *RADIOLOGY REPORT*  Clinical Data: Weakness.  CHEST - 2 VIEW  Comparison: 04/10/2006.  Findings: The heart is mildly enlarged but stable.  There is tortuosity of the thoracic aorta.  The  lungs are clear of acute process.  Mild chronic bronchitic changes.  No pleural effusion or pneumothorax.  The bony thorax is intact.  IMPRESSION: Mild cardiac enlargement, stable. Chronic  bronchitic type changes but no acute pulmonary findings.  Original Report Authenticated By: P. Loralie Champagne, M.D.   Dg Shoulder Right  02/15/2012  *RADIOLOGY REPORT*  Clinical Data: Right shoulder pain.  RIGHT SHOULDER - 2+ VIEW  Comparison: None  Findings: The joint spaces are maintained.  Mild glenohumeral and AC joint degenerative changes.  No acute fracture or abnormal soft tissue calcifications.  The right lung apex is clear.  The right upper ribs are intact.  IMPRESSION: Mild degenerative changes but no fracture or dislocation.  Original Report Authenticated By: P. Loralie Champagne, M.D.   Ct Head Wo Contrast  02/16/2012  *RADIOLOGY REPORT*  Clinical Data: Weakness.  CT HEAD WITHOUT CONTRAST  Technique:  Contiguous axial images were obtained from the base of the skull through the vertex without contrast.  Comparison: Head CT 05/27/2007.  Brain MRI 05/27/2007.  Findings: The there are multiple well defined foci of low attenuation throughout the basal ganglia bilaterally, similar to the prior study from 05/27/2007, compatible with old lacunar infarctions.  Mild cerebral and cerebellar atrophy is noted.  Old cerebellar infarctions in the right cerebellar hemisphere are also noted.  There are extensive patchy and confluent areas of decreased attenuation throughout the deep and periventricular white matter of the cerebral hemispheres bilaterally, consistent with chronic microvascular ischemic disease.  No definite acute intracranial abnormalities.  Specifically, no overt signs of acute/subacute cerebral ischemia, no acute intracranial hemorrhage, no focal mass, mass effect or hydrocephalous.  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  No acute displaced skull fractures are identified.  Visualized paranasal sinuses and mastoids are well pneumatized.  IMPRESSION: 1.  No acute intracranial abnormalities. 2.  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  In addition, there is extensive chronic microvascular ischemic disease of the white matter. 3.  Mild cerebral and cerebellar atrophy.  Original Report Authenticated By: Florencia Reasons, M.D.   Mr Laqueta Jean Wo Contrast  02/16/2012  *RADIOLOGY REPORT*  Clinical Data: Diabetic hypertensive with a history of multiple sclerosis presenting with weakness and difficulty ambulating.  MRI HEAD WITHOUT AND WITH CONTRAST  Technique:  Multiplanar, multiecho pulse sequences of the brain and surrounding structures were obtained according to standard protocol without and with intravenous contrast  Contrast: 17mL MULTIHANCE GADOBENATE DIMEGLUMINE 529 MG/ML IV SOLN  Comparison: 02/16/2012 CT. 05/27/2007 MR.  Findings: Areas of restricted motion within the right frontal lobe more consistent with acute infarcts than restricted motion related to active MS plaques.  This includes infarct involving the right caudate head and the anterior aspect of the right lenticular nucleus with smaller scattered infarcts throughout the right frontal lobe.  Remote small infarcts of the basal ganglia, thalami and cerebellum bilaterally.  Prominent white matter type changes fairly confluent in the periventricular region having progressed since prior examination. This may represent combination of result of multiple sclerosis and / or small vessel disease.  Global atrophy.  Ventricular prominence felt to be related to atrophy rather hydrocephalus.  No intracranial mass.  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  Ectatic vertebral arteries and basilar artery. Basilar artery causes impression upon the undersurface of the hypothalamus. Ectatic patent carotid arteries.  Mild exophthalmos.  Minimal paranasal sinus mucosal thickening.  IMPRESSION:  Acute infarct involving the right caudate head and the anterior aspect of the right lenticular nucleus with smaller scattered infarcts  throughout the right frontal lobe.  Remote small infarcts of the basal ganglia, thalami and cerebellum bilaterally.  Prominent white matter type changes fairly confluent in the periventricular region having progressed since prior examination. This may represent combination of result of multiple sclerosis and / or small vessel disease.  Global atrophy.  This has been made a PRA call report utilizing dashboard call feature.  Original Report Authenticated By: Fuller Canada, M.D.   Mr Cervical Spine W Wo Contrast  02/16/2012  *RADIOLOGY REPORT*  Clinical Data: Diabetic hypertensive with multiple sclerosis and difficulty walking.  MRI CERVICAL SPINE WITHOUT AND WITH CONTRAST  Technique:  Multiplanar and multiecho pulse sequences of the cervical spine, to include the craniocervical junction and cervicothoracic junction, were obtained according to standard protocol without and with intravenous contrast.  Contrast:  17 ml MultiHance.  Comparison: MR brain performed the same date.  No comparison cervical spine MR.  Findings: Markedly enlarged heterogeneous thyroid gland with dominant mass on the left displacing surrounding airway and vascular structures.  Thyroid ultrasound recommended for further delineation.  No focal cervical cord signal abnormality or enhancement detected on this motion degraded exam.  C2-3:  Minimal bulge.  C3-4:  Mild bulge.  Minimal left uncinate bony overgrowth and foraminal narrowing.  C4-5:  Moderate broad-based protrusion.  Moderate spinal stenosis and mild cord flattening.  Uncinate bony overgrowth.  Moderate foraminal narrowing greater on the right.  C5-6:  Small to moderate broad-based right posterior lateral disc protrusion and osteophyte with mild compression of the right aspect of the cord.  Uncinate bony overgrowth.  Moderate to marked right- sided and mild left-sided foraminal narrowing.  C6-7:  Minimal bulge.  C7-T1:  Mild facet joint degenerative changes.  Minimal bulge.  T1-2:  Mild  facet joint degenerative changes.  Minimal bulge.  T2-3:  Suggestion of posterior lateral tiny protrusion greater on the right.  Axial images not obtained.  IMPRESSION: No focal cord signal abnormality or abnormal enhancement noted on this motion degraded exam.  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  Markedly enlarged thyroid gland with mass greater on the left compressing surrounding air column and vascular structures. Thyroid ultrasound recommended for further delineation.  This has been made a PRA call report utilizing dashboard call feature.  Original Report Authenticated By: Fuller Canada, M.D.    Assessment/Plan: Diagnosis:  ight caudate head and anterior aspect of the right lenticular nucleus with smaller scattered infarcts throughout the right frontal lobe./ MS 1. Does the need for close, 24 hr/day medical supervision in concert with the patient's rehab needs make it unreasonable for this patient to be served in a less intensive setting? Yes and Potentially 2. Co-Morbidities requiring supervision/potential complications: htn, dm 3. Due to bladder management, bowel management, safety, skin/wound care, disease management, medication administration, pain management and patient education, does the patient require 24 hr/day rehab nursing? Yes 4. Does the patient require coordinated care of a physician, rehab nurse, PT (1-2 hrs/day, 5 days/week), OT (1-2 hrs/day, 5 days/week) and SLP (1-2 hrs/day, 5 days/week) to address physical and functional deficits in the context of the above medical diagnosis(es)? Yes Addressing deficits in the following areas: balance, endurance, locomotion, strength, transferring, bowel/bladder control, bathing, dressing, feeding, grooming, toileting, cognition, speech, language and swallowing 5. Can the patient actively participate in an intensive therapy program of at least 3  hrs of therapy per day at least 5 days per week? Yes 6. The  potential for patient to make measurable gains while on inpatient rehab is good 7. Anticipated functional outcomes upon discharge from inpatient rehab are min assist to supervision with PT, min assist with OT, min assist + with SLP. 8. Estimated rehab length of stay to reach the above functional goals is: 2-3 weeks 9. Does the patient have adequate social supports to accommodate these discharge functional goals? Yes 10. Anticipated D/C setting: Home 11. Anticipated post D/C treatments: HH therapy 12. Overall Rehab/Functional Prognosis: excellent  RECOMMENDATIONS: This patient's condition is appropriate for continued rehabilitative care in the following setting: CIR Patient has agreed to participate in recommended program. Yes Note that insurance prior authorization may be required for reimbursement for recommended care.  Comment: I spoke at length with the patient and her family. They are willing to provide the projected care needed after a CIR admit. Rehab RN to follow up.    Ivory Broad, MD 02/17/2012
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
## 2012-02-17 NOTE — Evaluation (Signed)
Occupational Therapy Evaluation Patient Details Name: Hannah Lamb MRN: [id number] DOB: 07-07-1941 Today's Date: 02/17/2012 Time: 7829-5621 OT Time Calculation (min): 30 min  OT Assessment / Plan / Recommendation Clinical Impression  This 71 year old female was admitted with hypertensive crisis.  She has a h/o MS and MRI reveals acute infarct.  She was mod I with ADLs prior to admission and now needs A x 2 for LB ADLs and mobility.  She is appropriate for skilled OT in acute to address strength, activity tolerance and balance to improve function.      OT Assessment  Patient needs continued OT Services    Follow Up Recommendations  Inpatient Rehab    Barriers to Discharge      Equipment Recommendations  Defer to next venue    Recommendations for Other Services Rehab consult  Frequency  Min 2X/week    Precautions / Restrictions Precautions Precautions: Fall Precaution Comments: R shoulder deficits PTA. Family present and requested limited pulling up on R UE when mobilizing pt Restrictions Weight Bearing Restrictions: No       ADL  Eating/Feeding: Simulated;Set up Where Assessed - Eating/Feeding: Chair Grooming: Simulated;Set up Where Assessed - Grooming: Supported sitting Upper Body Bathing: Simulated;Minimal assistance Where Assessed - Upper Body Bathing: Supported sitting Lower Body Bathing: Simulated;+2 Total assistance Lower Body Bathing: Patient Percentage: 60% Where Assessed - Lower Body Bathing: Supported sit to stand Upper Body Dressing: Simulated;Moderate assistance Where Assessed - Upper Body Dressing: Supported sitting Lower Body Dressing: Simulated;+2 Total assistance Lower Body Dressing: Patient Percentage: 10% Where Assessed - Lower Body Dressing: Sopported sit to stand Toilet Transfer: Simulated;+2 Total assistance Toilet Transfer: Patient Percentage: 50% Toilet Transfer Method: Stand pivot (pt with strong posterior lean; did better on 2nd try) Toileting  - Clothing Manipulation and Hygiene: Simulated;+2 Total assistance Toileting - Clothing Manipulation and Hygiene: Patient Percentage: 20% Equipment Used: Gait belt ADL Comments: Pt able to reach to straighten socks.  Sitting EOB, pt leaning posteriorly.  When in chair, able to work on forward weight shifts.  Fearful of falling but very motivated    OT Diagnosis: Generalized weakness  OT Problem List: Decreased strength;Decreased activity tolerance;Impaired balance (sitting and/or standing);Decreased safety awareness;Decreased knowledge of use of DME or AE OT Treatment Interventions: Self-care/ADL training;Therapeutic activities;DME and/or AE instruction;Balance training;Patient/family education;Energy conservation;Neuromuscular education   OT Goals Acute Rehab OT Goals OT Goal Formulation: With patient Time For Goal Achievement: 03/02/12 Potential to Achieve Goals: Good ADL Goals Pt Will Transfer to Toilet: with 2+ total assist;3-in-1;Stand pivot transfer (Pt 75%) ADL Goal: Toilet Transfer - Progress: Goal set today Miscellaneous OT Goals Miscellaneous OT Goal #1: Pt will go from sit to stand with A X 2, pt 75% and maintain for 2 minutes with min A for ADLS OT Goal: Miscellaneous Goal #1 - Progress: Goal set today Miscellaneous OT Goal #2: Pt will use AE for adls with set up/supervision, in supported sitting OT Goal: Miscellaneous Goal #2 - Progress: Goal set today Miscellaneous OT Goal #3: Pt will maintain unsupported sitting at EOB x 5 minutes with occasional min A for ADLs OT Goal: Miscellaneous Goal #3 - Progress: Goal set today  Visit Information  Last OT Received On: 02/17/12 Assistance Needed: +2    Subjective Data  Subjective: "i'd like to get OOB and sit in the chair" Patient Stated Goal: Get back to where she was; be less fearful of standing   Prior Functioning  Home Living Lives With: Daughter Available Help at Discharge: Family  Bathroom Shower/Tub: Architectural technologist: Standard (3:1) Home Adaptive Equipment: Walker - rolling;Shower chair with back;Bedside commode/3-in-1 Prior Function Level of Independence: Independent with assistive device(s) Communication Communication: No difficulties Dominant Hand: Left    Cognition  Overall Cognitive Status: Appears within functional limits for tasks assessed/performed Arousal/Alertness: Awake/alert Behavior During Session: [facility] for tasks performed    Extremity/Trunk Assessment Right Upper Extremity Assessment RUE ROM/Strength/Tone: Deficits RUE ROM/Strength/Tone Deficits: scapula upwardly rotated.  Lifts shoulder approximately 70 degrees with compensation of trunk; AAROM 110 with scapula supported.  Movement ataxic, decreased intrinsics.  Able to grasp Left Upper Extremity Assessment LUE ROM/Strength/Tone: [facility] for tasks assessed (Able to reach to 90, reach behind head and back) Trunk Assessment Trunk Assessment: Other exceptions (strong posterior lean)   Mobility Bed Mobility Bed Mobility: Supine to Sit Supine to Sit: 1: +2 Total assist;HOB flat Supine to Sit: Patient Percentage: 40% Transfers Sit to Stand: 1: +2 Total assist;From bed;From elevated surface Sit to Stand: Patient Percentage: 50% (40% first trial; 50% second) Details for Transfer Assistance: VCs safety, technique, hand placement. Assist to rise, stabilize, control dsecent. Sat EOB for 2-3 minutes before able to attempt transfer due to pt leaning moderately posteriorly and requiring assist and Max cues for correct posture   Exercise    Balance Static Sitting Balance Static Sitting - Balance Support: Bilateral upper extremity supported Static Sitting - Level of Assistance: 3: Mod assist  End of Session OT - End of Session Equipment Utilized During Treatment: Gait belt;Other (comment) (rolling walker) Activity Tolerance: Patient tolerated treatment well Patient left: in chair;with chair alarm set;with call bell/phone  within reach [facility] pad underneath)   Nike Southers 02/17/2012, 3:45 PM Marica Otter, OTR/L [phone] 02/17/2012

## 2012-02-17 NOTE — Progress Notes (Signed)
Per rehab MD consult today, patient met criteria for CIR. Patient having multiple tests today. Will f/u on Monday regarding CIR and medical readiness. Thanks-Herald Vallin,RN adm coordinator. [phone]

## 2012-02-17 NOTE — Progress Notes (Signed)
    Subjective:  No chest pain, dyspnea, or palps.  Objective:  Vital Signs in the last 24 hours: Temp:  [97.6 F (36.4 C)-98.4 F (36.9 C)] 97.9 F (36.6 C) (06/07 0621) Pulse Rate:  [49-70] 49  (06/07 0621) Resp:  [19-20] 20  (06/07 0621) BP: (149-158)/(77-82) 158/77 mmHg (06/07 0621) SpO2:  [97 %-98 %] 98 % (06/07 0621)  Intake/Output from previous day: 06/06 0701 - 06/07 0700 In: 720 [P.O.:720] Out: 600 [Urine:600]  Physical Exam: Pt is alert and oriented, pleasant elderly woman in NAD HEENT: normal Neck: JVP - normal, carotids 2+= with soft bilateral bruits Lungs: CTA bilaterally CV: RRR with 2/6 systolic ejection murmur at the LUSB Abd: soft, NT, Positive BS, no hepatomegaly Ext: no C/C/E Skin: warm/dry no rash  Lab Results:  Basename 02/17/12 0454 02/16/12 0200  WBC 6.5 8.5  HGB 11.9* 14.4  PLT 150 174    Basename 02/17/12 0454 02/16/12 1329 02/16/12 0200  NA 137 -- 139  K 3.9 3.5 --  CL 99 -- 98  CO2 28 -- 30  GLUCOSE 207* -- 215*  BUN 16 -- 7  CREATININE 0.98 -- 0.73    Basename 02/16/12 1825 02/16/12 0959  TROPONINI <0.30 <0.30    Cardiac Studies: 2D Echo pending  Tele: sinus rhythm, no significant arrhythmia  Assessment/Plan:  1. Syncope - vague by history and complicated by marked generalized weakness. Not sure she had frank syncope. 2D Echo pending. Carotid doppler pending. Will follow-up after echo done.  2. Abnormal EKG - if echo shows normal LV function, I would not pursue an ischemic workup.  3. Increase pBNP - agree with Dr Antoine Poche pt is not clinically volume overloaded. Continue current management.  4. Dispo - await 2D echo and carotid results.   Tonny Bollman, M.D. 02/17/2012, 8:33 AM

## 2012-02-17 NOTE — Progress Notes (Signed)
Received referral on this patient.  Patient having questions regarding her diabetes management in hospital.  Spoke with patient at length about her insulin regimen at home.  Patient told me she was diagnosed with diabetes when she was in her late 65's.  Has taken insulin ever since her initial diagnosis.  From our conversation, it sounds as if patient has been taking 70/30 insulin for many years now.    Patient told me she checks her CBGs 3-4 times per day but has not been recording these readings as of late.  She takes her 70/30 bid with breakfast and supper.  Reminded patient about the importance of eating three meals day while she is taking 70/30 insulin.  Explained the 2 different types of insulin in 70/30 and how they both work.  Patient told me she does try to eat consistently 3 times per day.  I also encouraged patient to record her CBGs on a notepad so she can take them to her primary MD, Dr. Lonia Blood.  Patient told me she had tried to see a diabetes specialist (not sure who) here in [place] about a year ago, however she missed her appt and did not reschedule.  I encouraged this patient to please try and reschedule with that diabetes specialist after she goes home.  Patient asked me if I thought she could stop taking insulin and try "the pills".  I explained to patient that I don't think she makes any insulin anymore, and that she has to have insulin injections to keep her healthy.  I explained that "the pills" were not insulin, but did different jobs for patients with Type 2 diabetes.  Patient said she understood and said she appreciated all my questions and help.  Noted patient may go to Inpatient Rehab as early as next week.  Noted patient had one mild episode of hypoglycemia last evening at supper (CBG 67 mg/dl).  70/30 insulin held as a result.  Patient woke up this morning with elevated CBG of 183 mg/dl.  Patient likely needs a supper time dose of 70/30 to better control CBGs.  Recommend  adding 70/30 15 units Qsupper with assessment of her CBG tomorrow AM.  Noted SSI modified to start coverage at 151 mg/dl.  Agree.  Will follow and assist as needed.  Ambrose Finland RN, MSN, CDE Diabetes Coordinator Inpatient Diabetes Program [phone]

## 2012-02-17 NOTE — Consult Note (Signed)
Reason for Consult: Thyroid mass  Referring Physician: Gwenyth Bender, NP  HPI:  Hannah Lamb is an 71 y.o. female who was admitted on 02/15/12 for evaluation of her generalized weakness.  She has a h/o HTN, MS, and DM.  She was noted to have acute cerebral infarction.  In addition, her cervical MRI shows markedly enlarged thyroid gland with mass greater on the left compressing surrounding air column and vascular structures. Her neck US shows enlarged thyroid with multiple nodules and masses. The radiologist suspects the left lobe is completely replaced by a single mass.  The pt c/o that she has been experiencing compressive symptoms for many years.  She complains of dysphagia with food and pills frequently get stuck in her throat.  She denies any dyspnea or significant hoarseness. No previous h/o thyroid surgery or biopsy.   Past Medical History  Diagnosis Date  . Hypertension   . MS (multiple sclerosis)   . Diabetes mellitus   . Goiter   . Hyperlipidemia     Past Surgical History  Procedure Date  . Abdominal hysterectomy     Family History  Problem Relation Age of Onset  . Diabetes Father     Social History:  reports that she has never smoked. She does not have any smokeless tobacco history on file. She reports that she does not drink alcohol or use illicit drugs.  Allergies:  Allergies  Allergen Reactions  . Sulfa Drugs Cross Reactors     dizziness    Medications:  I have reviewed the patient's current medications. Scheduled:   . aspirin  243 mg Oral Once  . atorvastatin  10 mg Oral q1800  . clopidogrel  75 mg Oral Q breakfast  . insulin aspart  0-8 Units Subcutaneous TID WC  . insulin aspart  4 Units Subcutaneous Once  . insulin aspart protamine-insulin aspart  20 Units Subcutaneous Q breakfast  . nebivolol  10 mg Oral Daily  . potassium chloride  20 mEq Oral Daily  . sodium chloride  3 mL Intravenous Q12H  . DISCONTD: aspirin  81 mg Oral Once  . DISCONTD: aspirin  EC  81 mg Oral Daily  . DISCONTD: insulin aspart  0-9 Units Subcutaneous TID WC  . DISCONTD: insulin aspart protamine-insulin aspart  30 Units Subcutaneous BID WC  . DISCONTD: lisinopril  5 mg Oral Daily    Results for orders placed during the hospital encounter of 02/15/12 (from the past 48 hour(s))  CBC     Status: Normal   Collection Time   02/15/12  9:45 PM      Component Value Range Comment   WBC 6.3  4.0 - 10.5 (K/uL)    RBC 4.75  3.87 - 5.11 (MIL/uL)    Hemoglobin 13.7  12.0 - 15.0 (g/dL)    HCT 16.1  09.6 - 04.5 (%)    MCV 84.4  78.0 - 100.0 (fL)    MCH 28.8  26.0 - 34.0 (pg)    MCHC 34.2  30.0 - 36.0 (g/dL)    RDW 40.9  81.1 - 91.4 (%)    Platelets 185  150 - 400 (K/uL)   DIFFERENTIAL     Status: Normal   Collection Time   02/15/12  9:45 PM      Component Value Range Comment   Neutrophils Relative 61  43 - 77 (%)    Neutro Abs 3.8  1.7 - 7.7 (K/uL)    Lymphocytes Relative 33  12 - 46 (%)  Lymphs Abs 2.1  0.7 - 4.0 (K/uL)    Monocytes Relative 5  3 - 12 (%)    Monocytes Absolute 0.3  0.1 - 1.0 (K/uL)    Eosinophils Relative 1  0 - 5 (%)    Eosinophils Absolute 0.1  0.0 - 0.7 (K/uL)    Basophils Relative 0  0 - 1 (%)    Basophils Absolute 0.0  0.0 - 0.1 (K/uL)   COMPREHENSIVE METABOLIC PANEL     Status: Abnormal   Collection Time   02/15/12  9:45 PM      Component Value Range Comment   Sodium 140  135 - 145 (mEq/L)    Potassium 3.3 (*) 3.5 - 5.1 (mEq/L)    Chloride 100  96 - 112 (mEq/L)    CO2 32  19 - 32 (mEq/L)    Glucose, Bld 184 (*) 70 - 99 (mg/dL)    BUN 8  6 - 23 (mg/dL)    Creatinine, Ser 4.09  0.50 - 1.10 (mg/dL)    Calcium 9.5  8.4 - 10.5 (mg/dL)    Total Protein 7.5  6.0 - 8.3 (g/dL)    Albumin 3.5  3.5 - 5.2 (g/dL)    AST 16  0 - 37 (U/L)    ALT 9  0 - 35 (U/L)    Alkaline Phosphatase 60  39 - 117 (U/L)    Total Bilirubin 0.3  0.3 - 1.2 (mg/dL)    GFR calc non Af Amer 89 (*) >90 (mL/min)    GFR calc Af Amer >90  >90 (mL/min)   PRO B NATRIURETIC  PEPTIDE     Status: Abnormal   Collection Time   02/15/12  9:45 PM      Component Value Range Comment   Pro B Natriuretic peptide (BNP) 1818.0 (*) 0 - 125 (pg/mL)   URINALYSIS, ROUTINE W REFLEX MICROSCOPIC     Status: Abnormal   Collection Time   02/15/12  9:54 PM      Component Value Range Comment   Color, Urine YELLOW  YELLOW     APPearance CLEAR  CLEAR     Specific Gravity, Urine 1.019  1.005 - 1.030     pH 8.0  5.0 - 8.0     Glucose, UA 500 (*) NEGATIVE (mg/dL)    Hgb urine dipstick NEGATIVE  NEGATIVE     Bilirubin Urine NEGATIVE  NEGATIVE     Ketones, ur NEGATIVE  NEGATIVE (mg/dL)    Protein, ur NEGATIVE  NEGATIVE (mg/dL)    Urobilinogen, UA 0.2  0.0 - 1.0 (mg/dL)    Nitrite NEGATIVE  NEGATIVE     Leukocytes, UA NEGATIVE  NEGATIVE  MICROSCOPIC NOT DONE ON URINES WITH NEGATIVE PROTEIN, BLOOD, LEUKOCYTES, NITRITE, OR GLUCOSE <1000 mg/dL.  POCT I-STAT TROPONIN I     Status: Normal   Collection Time   02/15/12  9:55 PM      Component Value Range Comment   Troponin i, poc 0.00  0.00 - 0.08 (ng/mL)    Comment 3            HEMOGLOBIN A1C     Status: Abnormal   Collection Time   02/16/12  2:00 AM      Component Value Range Comment   Hemoglobin A1C 10.5 (*) <5.7 (%)    Mean Plasma Glucose 255 (*) <117 (mg/dL)   CARDIAC PANEL(CRET KIN+CKTOT+MB+TROPI)     Status: Normal   Collection Time   02/16/12  2:00 AM  Component Value Range Comment   Total CK 130  7 - 177 (U/L)    CK, MB 2.7  0.3 - 4.0 (ng/mL)    Troponin I <0.30  <0.30 (ng/mL)    Relative Index 2.1  0.0 - 2.5    BASIC METABOLIC PANEL     Status: Abnormal   Collection Time   02/16/12  2:00 AM      Component Value Range Comment   Sodium 139  135 - 145 (mEq/L)    Potassium 2.6 (*) 3.5 - 5.1 (mEq/L)    Chloride 98  96 - 112 (mEq/L)    CO2 30  19 - 32 (mEq/L)    Glucose, Bld 215 (*) 70 - 99 (mg/dL)    BUN 7  6 - 23 (mg/dL)    Creatinine, Ser 1.61  0.50 - 1.10 (mg/dL)    Calcium 9.0  8.4 - 10.5 (mg/dL)    GFR calc non Af Amer  84 (*) >90 (mL/min)    GFR calc Af Amer >90  >90 (mL/min)   CBC     Status: Normal   Collection Time   02/16/12  2:00 AM      Component Value Range Comment   WBC 8.5  4.0 - 10.5 (K/uL)    RBC 5.02  3.87 - 5.11 (MIL/uL)    Hemoglobin 14.4  12.0 - 15.0 (g/dL)    HCT 09.6  04.5 - 40.9 (%)    MCV 83.7  78.0 - 100.0 (fL)    MCH 28.7  26.0 - 34.0 (pg)    MCHC 34.3  30.0 - 36.0 (g/dL)    RDW 81.1  91.4 - 78.2 (%)    Platelets 174  150 - 400 (K/uL)   MAGNESIUM     Status: Normal   Collection Time   02/16/12  2:00 AM      Component Value Range Comment   Magnesium 1.5  1.5 - 2.5 (mg/dL)   GLUCOSE, CAPILLARY     Status: Abnormal   Collection Time   02/16/12  7:48 AM      Component Value Range Comment   Glucose-Capillary 341 (*) 70 - 99 (mg/dL)   CARDIAC PANEL(CRET KIN+CKTOT+MB+TROPI)     Status: Normal   Collection Time   02/16/12  9:59 AM      Component Value Range Comment   Total CK 165  7 - 177 (U/L)    CK, MB 2.4  0.3 - 4.0 (ng/mL)    Troponin I <0.30  <0.30 (ng/mL)    Relative Index 1.5  0.0 - 2.5    GLUCOSE, CAPILLARY     Status: Abnormal   Collection Time   02/16/12 11:26 AM      Component Value Range Comment   Glucose-Capillary 254 (*) 70 - 99 (mg/dL)   POTASSIUM     Status: Normal   Collection Time   02/16/12  1:29 PM      Component Value Range Comment   Potassium 3.5  3.5 - 5.1 (mEq/L)   GLUCOSE, CAPILLARY     Status: Abnormal   Collection Time   02/16/12  6:11 PM      Component Value Range Comment   Glucose-Capillary 67 (*) 70 - 99 (mg/dL)   CARDIAC PANEL(CRET KIN+CKTOT+MB+TROPI)     Status: Abnormal   Collection Time   02/16/12  6:25 PM      Component Value Range Comment   Total CK 217 (*) 7 - 177 (U/L)    CK, MB  2.4  0.3 - 4.0 (ng/mL)    Troponin I <0.30  <0.30 (ng/mL)    Relative Index 1.1  0.0 - 2.5    GLUCOSE, CAPILLARY     Status: Abnormal   Collection Time   02/16/12  6:57 PM      Component Value Range Comment   Glucose-Capillary 183 (*) 70 - 99 (mg/dL)   GLUCOSE,  CAPILLARY     Status: Abnormal   Collection Time   02/16/12  9:11 PM      Component Value Range Comment   Glucose-Capillary 243 (*) 70 - 99 (mg/dL)    Comment 1 Documented in Chart      Comment 2 Notify RN     GLUCOSE, CAPILLARY     Status: Abnormal   Collection Time   02/16/12 11:03 PM      Component Value Range Comment   Glucose-Capillary 251 (*) 70 - 99 (mg/dL)    Comment 1 Documented in Chart      Comment 2 Notify RN     GLUCOSE, CAPILLARY     Status: Abnormal   Collection Time   02/17/12  1:10 AM      Component Value Range Comment   Glucose-Capillary 209 (*) 70 - 99 (mg/dL)   GLUCOSE, CAPILLARY     Status: Abnormal   Collection Time   02/17/12  3:01 AM      Component Value Range Comment   Glucose-Capillary 203 (*) 70 - 99 (mg/dL)   CBC     Status: Abnormal   Collection Time   02/17/12  4:54 AM      Component Value Range Comment   WBC 6.5  4.0 - 10.5 (K/uL)    RBC 4.22  3.87 - 5.11 (MIL/uL)    Hemoglobin 11.9 (*) 12.0 - 15.0 (g/dL)    HCT 45.4 (*) 09.8 - 46.0 (%)    MCV 85.1  78.0 - 100.0 (fL)    MCH 28.2  26.0 - 34.0 (pg)    MCHC 33.1  30.0 - 36.0 (g/dL)    RDW 11.9  14.7 - 82.9 (%)    Platelets 150  150 - 400 (K/uL)   BASIC METABOLIC PANEL     Status: Abnormal   Collection Time   02/17/12  4:54 AM      Component Value Range Comment   Sodium 137  135 - 145 (mEq/L)    Potassium 3.9  3.5 - 5.1 (mEq/L)    Chloride 99  96 - 112 (mEq/L)    CO2 28  19 - 32 (mEq/L)    Glucose, Bld 207 (*) 70 - 99 (mg/dL)    BUN 16  6 - 23 (mg/dL)    Creatinine, Ser 5.62  0.50 - 1.10 (mg/dL)    Calcium 9.6  8.4 - 10.5 (mg/dL)    GFR calc non Af Amer 57 (*) >90 (mL/min)    GFR calc Af Amer 66 (*) >90 (mL/min)   LIPID PANEL     Status: Abnormal   Collection Time   02/17/12  4:54 AM      Component Value Range Comment   Cholesterol 194  0 - 200 (mg/dL)    Triglycerides 76  <130 (mg/dL)    HDL 47  >86 (mg/dL)    Total CHOL/HDL Ratio 4.1      VLDL 15  0 - 40 (mg/dL)    LDL Cholesterol 578 (*) 0 -  99 (mg/dL)   GLUCOSE, CAPILLARY     Status: Abnormal  Collection Time   02/17/12  5:31 AM      Component Value Range Comment   Glucose-Capillary 195 (*) 70 - 99 (mg/dL)   GLUCOSE, CAPILLARY     Status: Abnormal   Collection Time   02/17/12  7:45 AM      Component Value Range Comment   Glucose-Capillary 183 (*) 70 - 99 (mg/dL)    Comment 1 Notify RN     GLUCOSE, CAPILLARY     Status: Abnormal   Collection Time   02/17/12  9:09 AM      Component Value Range Comment   Glucose-Capillary 254 (*) 70 - 99 (mg/dL)    Comment 1 Notify RN     GLUCOSE, CAPILLARY     Status: Abnormal   Collection Time   02/17/12 11:43 AM      Component Value Range Comment   Glucose-Capillary 239 (*) 70 - 99 (mg/dL)    Comment 1 Notify RN     MAGNESIUM     Status: Normal   Collection Time   02/17/12 12:55 PM      Component Value Range Comment   Magnesium 1.5  1.5 - 2.5 (mg/dL)     Dg Chest 2 View  09/17/1094  *RADIOLOGY REPORT*  Clinical Data: Weakness.  CHEST - 2 VIEW  Comparison: 04/10/2006.  Findings: The heart is mildly enlarged but stable.  There is tortuosity of the thoracic aorta.  The lungs are clear of acute process.  Mild chronic bronchitic changes.  No pleural effusion or pneumothorax.  The bony thorax is intact.  IMPRESSION: Mild cardiac enlargement, stable. Chronic bronchitic type changes but no acute pulmonary findings.  Original Report Authenticated By: P. Loralie Champagne, M.D.   Dg Shoulder Right  02/15/2012  *RADIOLOGY REPORT*  Clinical Data: Right shoulder pain.  RIGHT SHOULDER - 2+ VIEW  Comparison: None  Findings: The joint spaces are maintained.  Mild glenohumeral and AC joint degenerative changes.  No acute fracture or abnormal soft tissue calcifications.  The right lung apex is clear.  The right upper ribs are intact.  IMPRESSION: Mild degenerative changes but no fracture or dislocation.  Original Report Authenticated By: P. Loralie Champagne, M.D.   Ct Head Wo Contrast  02/16/2012  *RADIOLOGY REPORT*   Clinical Data: Weakness.  CT HEAD WITHOUT CONTRAST  Technique:  Contiguous axial images were obtained from the base of the skull through the vertex without contrast.  Comparison: Head CT 05/27/2007.  Brain MRI 05/27/2007.  Findings: The there are multiple well defined foci of low attenuation throughout the basal ganglia bilaterally, similar to the prior study from 05/27/2007, compatible with old lacunar infarctions.  Mild cerebral and cerebellar atrophy is noted.  Old cerebellar infarctions in the right cerebellar hemisphere are also noted.  There are extensive patchy and confluent areas of decreased attenuation throughout the deep and periventricular white matter of the cerebral hemispheres bilaterally, consistent with chronic microvascular ischemic disease.  No definite acute intracranial abnormalities.  Specifically, no overt signs of acute/subacute cerebral ischemia, no acute intracranial hemorrhage, no focal mass, mass effect or hydrocephalous.  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  No acute displaced skull fractures are identified.  Visualized paranasal sinuses and mastoids are well pneumatized.  IMPRESSION: 1.  No acute intracranial abnormalities. 2.  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  In addition, there is extensive chronic microvascular ischemic disease of the white matter. 3.  Mild cerebral and cerebellar atrophy.  Original Report Authenticated By: Florencia Reasons, M.D.   Mr Laqueta Jean Wo Contrast  02/16/2012  *RADIOLOGY REPORT*  Clinical Data: Diabetic hypertensive with a history of multiple sclerosis presenting with weakness and difficulty ambulating.  MRI HEAD WITHOUT AND WITH CONTRAST  Technique:  Multiplanar, multiecho pulse sequences of the brain and surrounding structures were obtained according to standard protocol without and with intravenous contrast  Contrast: 17mL MULTIHANCE GADOBENATE  DIMEGLUMINE 529 MG/ML IV SOLN  Comparison: 02/16/2012 CT. 05/27/2007 MR.  Findings: Areas of restricted motion within the right frontal lobe more consistent with acute infarcts than restricted motion related to active MS plaques.  This includes infarct involving the right caudate head and the anterior aspect of the right lenticular nucleus with smaller scattered infarcts throughout the right frontal lobe.  Remote small infarcts of the basal ganglia, thalami and cerebellum bilaterally.  Prominent white matter type changes fairly confluent in the periventricular region having progressed since prior examination. This may represent combination of result of multiple sclerosis and / or small vessel disease.  Global atrophy.  Ventricular prominence felt to be related to atrophy rather hydrocephalus.  No intracranial mass.  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  Ectatic vertebral arteries and basilar artery. Basilar artery causes impression upon the undersurface of the hypothalamus. Ectatic patent carotid arteries.  Mild exophthalmos.  Minimal paranasal sinus mucosal thickening.  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  Remote small infarcts of the basal ganglia, thalami and cerebellum bilaterally.  Prominent white matter type changes fairly confluent in the periventricular region having progressed since prior examination. This may represent combination of result of multiple sclerosis and / or small vessel disease.  Global atrophy.  This has been made a PRA call report utilizing dashboard call feature.  Original Report Authenticated By: Fuller Canada, M.D.   Mr Cervical Spine W Wo Contrast  02/16/2012  *RADIOLOGY REPORT*  Clinical Data: Diabetic hypertensive with multiple sclerosis and difficulty walking.  MRI CERVICAL  SPINE WITHOUT AND WITH CONTRAST  Technique:  Multiplanar and multiecho pulse sequences of the cervical spine, to include the craniocervical junction and cervicothoracic junction, were obtained according to standard protocol without and with intravenous contrast.  Contrast:  17 ml MultiHance.  Comparison: MR brain performed the same date.  No comparison cervical spine MR.  Findings: Markedly enlarged heterogeneous thyroid gland with dominant mass on the left displacing surrounding airway and vascular structures.  Thyroid ultrasound recommended for further delineation.  No focal cervical cord signal abnormality or enhancement detected on this motion degraded exam.  C2-3:  Minimal bulge.  C3-4:  Mild bulge.  Minimal left uncinate bony overgrowth and foraminal narrowing.  C4-5:  Moderate broad-based protrusion.  Moderate spinal stenosis and mild cord flattening.  Uncinate bony overgrowth.  Moderate foraminal narrowing greater on the right.  C5-6:  Small to moderate broad-based right posterior lateral disc protrusion and osteophyte with mild compression of the right aspect of the cord.  Uncinate bony overgrowth.  Moderate to marked right- sided and mild left-sided foraminal narrowing.  C6-7:  Minimal bulge.  C7-T1:  Mild facet joint degenerative changes.  Minimal bulge.  T1-2:  Mild facet joint degenerative changes.  Minimal bulge.  T2-3:  Suggestion of posterior lateral tiny protrusion greater on the right.  Axial images not obtained.  IMPRESSION: No focal cord signal  abnormality or abnormal enhancement noted on this motion degraded exam.  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  Markedly enlarged thyroid gland with mass greater on the left compressing surrounding air column and vascular structures. Thyroid ultrasound recommended for further delineation.  This has been made a PRA call report utilizing dashboard call feature.  Original Report Authenticated By: Fuller Canada, M.D.    US Soft Tissue Head/neck  02/17/2012  *RADIOLOGY REPORT*  Clinical Data: Abnormal MRI.  Thyroid mass.  THYROID ULTRASOUND  Technique: Ultrasound examination of the thyroid gland and adjacent soft tissues was performed.  Comparison:  MRI of the cervical spine 02/16/2012  Findings:  Right thyroid lobe:  6.4 x 4.0 x 2.0 cm. Left thyroid lobe:  8.9 x 4.0 x 5.6 cm. Isthmus:  2.4 cm.  Focal nodules:  Multiple complex solid and cystic and solid heterogeneous nodules within the right thyroid lobe, including a 2.3 cm complex cystic and solid nodule in the upper pole, 2.6 cm solid nodule in the mid pole, and 2.4 cm complex lesion in the lower pole.  Much of the left lobe is likely replaced by a large heterogeneous solid mass, difficult to measure.  Lymphadenopathy:  None visualized.  IMPRESSION: Enlarged thyroid with multiple nodules and masses.  I suspect the left lobe is completely replaced by a single mass.  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  Recommend tissue sampling of the left thyroid lobe.  Original Report Authenticated By: Cyndie Chime, M.D.   Review of Systems  Gastrointestinal: Positive for constipation.  Musculoskeletal: Positive for myalgias and falls.  Neurological: Positive for weakness.  All other systems reviewed and are negative.  Blood pressure 175/88, pulse 68, temperature 98.4 F (36.9 C), temperature source Oral, resp. rate 20, height 5\' 7"  (1.702 m), weight 86.1 kg (189 lb 13.1 oz), SpO2 99.00%.  Physical Exam  Constitutional: She is oriented to person, place, and time. She appears well-developed and well-nourished.  HENT:  Head: Normocephalic and atraumatic.  Ears: Auricles and ear canals are normal. Nose: Nose normal.  Mouth/Throat: Mucosa intact. No lesion present.  Voice is strong. Eyes: Conjunctivae are normal. Pupils are equal, round, and reactive to light. No discharge.  Neck: Neck supple. Palpable enlarged thyroid lobes noted, larger on the left.  No other significant  LAD.   Cardiovascular: Normal rate and regular rhythm.  Musculoskeletal: She exhibits no edema.  Neurological: She is alert and oriented to person, place, and time.  CN 2-12 grossly intact. Skin: Skin is warm and dry.  Psychiatric: She has a normal mood and affect. Her speech is normal and behavior is normal.   Assessment/Plan: Enlarged thyroid with multiple nodules and masses.  The patient is currently symptomatic with dysphagia and globus sensation.  Plan US-guided FNA of the dominant left thyroid mass.  The patient will likely need to undergo total thyroidectomy once she is medically cleared. The risks, benefits, and details of the procedure are discussed with the patient.  Nakea Gouger,SUI W 02/17/2012, 4:13 PM

## 2012-02-17 NOTE — Progress Notes (Signed)
VASCULAR LAB PRELIMINARY  PRELIMINARY  PRELIMINARY  PRELIMINARY  Carotid duplex  completed.    Preliminary report:  Bilateral:  No evidence of hemodynamically significant internal carotid artery stenosis.   Vertebral artery flow is antegrade.      Terance Hart, RVT 02/17/2012, 9:38 AM

## 2012-02-17 NOTE — Progress Notes (Signed)
Subjective: Awake alert smiling. Reports feeling much better and pleased with the food here. NAD. No events during night.   Objective: Vital signs Filed Vitals:   02/16/12 1517 02/16/12 2119 02/17/12 0205 02/17/12 0621  BP: 155/78 156/82 149/80 158/77  Pulse: 70 66 55 49  Temp: 98.2 F (36.8 C) 98.4 F (36.9 C) 97.6 F (36.4 C) 97.9 F (36.6 C)  TempSrc: Oral Oral Axillary Axillary  Resp: 20 20 19 20   Height:      Weight:      SpO2: 98% 97% 98% 98%   Weight change:  Last BM Date: 02/16/12  Intake/Output from previous day: 06/06 0701 - 06/07 0700 In: 720 [P.O.:720] Out: 600 [Urine:600] Total I/O In: -  Out: 300 [Urine:300]   Physical Exam: General: Alert, awake, oriented x3, in no acute distress. HEENT: No bruits, no goiter. PERRL mucus membranes mouth moist/pink.  Heart: Regular rate and rhythm, without rubs, gallops. Trace LEE Lungs: Normal effort. Breath sounds clear to auscultation bilaterally. No wheeze Abdomen: Soft, nontender, nondistended, positive bowel sounds. Extremities: No clubbing cyanosis  with positive pedal pulses. Trace LEE Neuro: Grossly intact, nonfocal. Cranial nerve II-XII intact. Speech clear. Facial symmetry. Right grip 4/5 Left grip 5/5. Bilateral LE strength 5/5    Lab Results: Basic Metabolic Panel:  Basename 02/17/12 0454 02/16/12 1329 02/16/12 0200  NA 137 -- 139  K 3.9 3.5 --  CL 99 -- 98  CO2 28 -- 30  GLUCOSE 207* -- 215*  BUN 16 -- 7  CREATININE 0.98 -- 0.73  CALCIUM 9.6 -- 9.0  MG -- -- 1.5  PHOS -- -- --   Liver Function Tests:  [facility] 02/15/12 2145  AST 16  ALT 9  ALKPHOS 60  BILITOT 0.3  PROT 7.5  ALBUMIN 3.5   No results found for this basename: LIPASE:2,AMYLASE:2 in the last 72 hours No results found for this basename: AMMONIA:2 in the last 72 hours CBC:  Basename 02/17/12 0454 02/16/12 0200 02/15/12 2145  WBC 6.5 8.5 --  NEUTROABS -- -- 3.8  HGB 11.9* 14.4 --  HCT 35.9* 42.0 --  MCV 85.1 83.7 --    PLT 150 174 --   Cardiac Enzymes:  Basename 02/16/12 1825 02/16/12 0959 02/16/12 0200  CKTOTAL 217* 165 130  CKMB 2.4 2.4 2.7  CKMBINDEX -- -- --  TROPONINI <0.30 <0.30 <0.30   BNP:  [facility] 02/15/12 2145  PROBNP 1818.0*   D-Dimer: No results found for this basename: DDIMER:2 in the last 72 hours CBG:  Basename 02/17/12 0745 02/17/12 0531 02/17/12 0301 02/17/12 0110 02/16/12 2303 02/16/12 2111  GLUCAP 183* 195* 203* 209* 251* 243*   Hemoglobin A1C:  Basename 02/16/12 0200  HGBA1C 10.5*   Fasting Lipid Panel: No results found for this basename: CHOL,HDL,LDLCALC,TRIG,CHOLHDL,LDLDIRECT in the last 72 hours Thyroid Function Tests: No results found for this basename: TSH,T4TOTAL,FREET4,T3FREE,THYROIDAB in the last 72 hours Anemia Panel: No results found for this basename: VITAMINB12,FOLATE,FERRITIN,TIBC,IRON,RETICCTPCT in the last 72 hours Coagulation: No results found for this basename: LABPROT:2,INR:2 in the last 72 hours Urine Drug Screen: Drugs of Abuse  No results found for this basename: labopia, cocainscrnur, labbenz, amphetmu, thcu, labbarb    Alcohol Level: No results found for this basename: ETH:2 in the last 72 hours Urinalysis:  Basename 02/15/12 2154  COLORURINE YELLOW  LABSPEC 1.019  PHURINE 8.0  GLUCOSEU 500*  HGBUR NEGATIVE  BILIRUBINUR NEGATIVE  KETONESUR NEGATIVE  PROTEINUR NEGATIVE  UROBILINOGEN 0.2  NITRITE NEGATIVE  LEUKOCYTESUR NEGATIVE  Misc. Labs:  No results found for this or any previous visit (from the past 240 hour(s)).  Studies/Results: Dg Chest 2 View  02/15/2012  *RADIOLOGY REPORT*  Clinical Data: Weakness.  CHEST - 2 VIEW  Comparison: 04/10/2006.  Findings: The heart is mildly enlarged but stable.  There is tortuosity of the thoracic aorta.  The lungs are clear of acute process.  Mild chronic bronchitic changes.  No pleural effusion or pneumothorax.  The bony thorax is intact.  IMPRESSION: Mild cardiac enlargement, stable.  Chronic bronchitic type changes but no acute pulmonary findings.  Original Report Authenticated By: P. Loralie Champagne, M.D.   Dg Shoulder Right  02/15/2012  *RADIOLOGY REPORT*  Clinical Data: Right shoulder pain.  RIGHT SHOULDER - 2+ VIEW  Comparison: None  Findings: The joint spaces are maintained.  Mild glenohumeral and AC joint degenerative changes.  No acute fracture or abnormal soft tissue calcifications.  The right lung apex is clear.  The right upper ribs are intact.  IMPRESSION: Mild degenerative changes but no fracture or dislocation.  Original Report Authenticated By: P. Loralie Champagne, M.D.   Ct Head Wo Contrast  02/16/2012  *RADIOLOGY REPORT*  Clinical Data: Weakness.  CT HEAD WITHOUT CONTRAST  Technique:  Contiguous axial images were obtained from the base of the skull through the vertex without contrast.  Comparison: Head CT 05/27/2007.  Brain MRI 05/27/2007.  Findings: The there are multiple well defined foci of low attenuation throughout the basal ganglia bilaterally, similar to the prior study from 05/27/2007, compatible with old lacunar infarctions.  Mild cerebral and cerebellar atrophy is noted.  Old cerebellar infarctions in the right cerebellar hemisphere are also noted.  There are extensive patchy and confluent areas of decreased attenuation throughout the deep and periventricular white matter of the cerebral hemispheres bilaterally, consistent with chronic microvascular ischemic disease.  No definite acute intracranial abnormalities.  Specifically, no overt signs of acute/subacute cerebral ischemia, no acute intracranial hemorrhage, no focal mass, mass effect or hydrocephalous.  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  No acute displaced skull fractures are identified.  Visualized paranasal sinuses and mastoids are well pneumatized.  IMPRESSION: 1.  No acute intracranial abnormalities. 2.  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  In addition, there is extensive chronic microvascular ischemic disease of the white matter. 3.  Mild cerebral and cerebellar atrophy.  Original Report Authenticated By: Florencia Reasons, M.D.   Mr Laqueta Jean Wo Contrast  02/16/2012  *RADIOLOGY REPORT*  Clinical Data: Diabetic hypertensive with a history of multiple sclerosis presenting with weakness and difficulty ambulating.  MRI HEAD WITHOUT AND WITH CONTRAST  Technique:  Multiplanar, multiecho pulse sequences of the brain and surrounding structures were obtained according to standard protocol without and with intravenous contrast  Contrast: 17mL MULTIHANCE GADOBENATE DIMEGLUMINE 529 MG/ML IV SOLN  Comparison: 02/16/2012 CT. 05/27/2007 MR.  Findings: Areas of restricted motion within the right frontal lobe more consistent with acute infarcts than restricted motion related to active MS plaques.  This includes infarct involving the right caudate head and the anterior aspect of the right lenticular nucleus with smaller scattered infarcts throughout the right frontal lobe.  Remote small infarcts of the basal ganglia, thalami and cerebellum bilaterally.  Prominent white matter type changes fairly confluent in the periventricular region having progressed since prior examination. This may represent combination of result of multiple sclerosis and / or small vessel disease.  Global atrophy.  Ventricular prominence felt  to be related to atrophy rather hydrocephalus.  No intracranial mass.  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  Ectatic vertebral arteries and basilar artery. Basilar artery causes impression upon the undersurface of the hypothalamus. Ectatic patent carotid arteries.  Mild exophthalmos.  Minimal paranasal sinus mucosal thickening.  IMPRESSION: Acute infarct involving the right caudate head and the anterior aspect of the right lenticular nucleus with smaller scattered  infarcts throughout the right frontal lobe.  Remote small infarcts of the basal ganglia, thalami and cerebellum bilaterally.  Prominent white matter type changes fairly confluent in the periventricular region having progressed since prior examination. This may represent combination of result of multiple sclerosis and / or small vessel disease.  Global atrophy.  This has been made a PRA call report utilizing dashboard call feature.  Original Report Authenticated By: Fuller Canada, M.D.   Mr Cervical Spine W Wo Contrast  02/16/2012  *RADIOLOGY REPORT*  Clinical Data: Diabetic hypertensive with multiple sclerosis and difficulty walking.  MRI CERVICAL SPINE WITHOUT AND WITH CONTRAST  Technique:  Multiplanar and multiecho pulse sequences of the cervical spine, to include the craniocervical junction and cervicothoracic junction, were obtained according to standard protocol without and with intravenous contrast.  Contrast:  17 ml MultiHance.  Comparison: MR brain performed the same date.  No comparison cervical spine MR.  Findings: Markedly enlarged heterogeneous thyroid gland with dominant mass on the left displacing surrounding airway and vascular structures.  Thyroid ultrasound recommended for further delineation.  No focal cervical cord signal abnormality or enhancement detected on this motion degraded exam.  C2-3:  Minimal bulge.  C3-4:  Mild bulge.  Minimal left uncinate bony overgrowth and foraminal narrowing.  C4-5:  Moderate broad-based protrusion.  Moderate spinal stenosis and mild cord flattening.  Uncinate bony overgrowth.  Moderate foraminal narrowing greater on the right.  C5-6:  Small to moderate broad-based right posterior lateral disc protrusion and osteophyte with mild compression of the right aspect of the cord.  Uncinate bony overgrowth.  Moderate to marked right- sided and mild left-sided foraminal narrowing.  C6-7:  Minimal bulge.  C7-T1:  Mild facet joint degenerative changes.  Minimal bulge.   T1-2:  Mild facet joint degenerative changes.  Minimal bulge.  T2-3:  Suggestion of posterior lateral tiny protrusion greater on the right.  Axial images not obtained.  IMPRESSION: No focal cord signal abnormality or abnormal enhancement noted on this motion degraded exam.  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  Markedly enlarged thyroid gland with mass greater on the left compressing surrounding air column and vascular structures. Thyroid ultrasound recommended for further delineation.  This has been made a PRA call report utilizing dashboard call feature.  Original Report Authenticated By: Fuller Canada, M.D.    Medications: Scheduled Meds:   . aspirin  243 mg Oral Once  . aspirin EC  81 mg Oral Daily  . insulin aspart  4 Units Subcutaneous Once  . insulin aspart protamine-insulin aspart  30 Units Subcutaneous BID WC  . nebivolol  10 mg Oral Daily  . potassium chloride  10 mEq Intravenous Q1 Hr x 4  . potassium chloride  20 mEq Oral Daily  . potassium chloride  40 mEq Oral Once  . sodium chloride  3 mL Intravenous Q12H  . DISCONTD: aspirin  81 mg Oral Once  . DISCONTD: insulin aspart  0-9 Units Subcutaneous TID WC   Continuous Infusions:  PRN  Meds:.sodium chloride, gadobenate dimeglumine, sodium chloride  Assessment/Plan:  Principal Problem:  *Hypertensive emergency Active Problems:  DIABETES MELLITUS II, UNCOMPLICATED  HYPERCHOLESTEROLEMIA  HYPERTENSION, BENIGN SYSTEMIC  Weakness generalized  Multiple sclerosis  Abnormal EKG *Hypertensive emergency:  Resolved. Likely related to acute infarct involving the right caudate head and the anterior  aspect of the right lenticular nucleus with smaller scattered  infarcts throughout the right frontal lobe per MRI.  Appreciate neurology input. Results of  2 decho pending. Carotid dopplar pending.   CE negative. Volume status neg 2.1L. Will monitor closely.  Active Problems:   Acute infarct right  caudate head: per MRI. Neurology following. Neuro exam as above. Echo pending , carotid duplex pending. LDL 132 otherwise lipids profile WNL. Continue ASA  Will confirm with neuro recommended dose. Add statin  Abnormal EKG: appreciate cardiology assistance. Ischemia uncertain.  Echo pending. CE neg. No CP. Electrolytes WNL. Continue ASA   DIABETES MELLITUS II, UNCOMPLICATED: HgA1c 10.5. CBG 183-203. Continue home 70/30 at lower dose. Request diabetic coordinator consult as pt verbalizes confusion regarding management.   Hypokalemia: likely related to lasix received in ED. Resolved.   HYPERCHOLESTEROLEMIA  HYPERTENSION, BENIGN SYSTEMIC: see #1 Only fair control. Continue BB and add low dose ACE. Monitor  Weakness generalized: likely related to #2. See treatment for #2. Continue PT/OT. Appetite improved.    Multiple sclerosis: Neurology following. Appreciate assistance.   Thyroid mass: per MRI. Will get [place] of thyroid. Check TSH. Consider ENT consult. Dispo: lives at home with daughter. Hopefully home 24-48 hours.       LOS: 2 days   [facility] 02/17/2012, 8:23 AM

## 2012-02-17 NOTE — Progress Notes (Signed)
I have directly reviewed the clinical findings, lab, imaging studies and management of this patient in detail. I have interviewed and examined the patient and agree with the documentation,  as recorded by the Physician extender.   CVA noted on MRI, history of underlying MS, patient switched from aspirin to Plavix, carotid duplex unremarkable, echo is pending, continue PTOT speech eval. LDL is greater 132 he has been initiated on statin. Continue monitoring glycemic control patient on insulin.  Thyroid mass found on MRI and ultrasound, discussed with ENT physician personally, requested ultrasound guided biopsy if it can be done in presence of Plavix, patient will need definitive surgery once her acute medical issues are resolved few months down the line.   Leroy Sea M.D on 02/17/2012 at 1:39 PM  [facility] Office  [phone]

## 2012-02-18 DIAGNOSIS — I10 Essential (primary) hypertension: Secondary | ICD-10-CM

## 2012-02-18 DIAGNOSIS — R5381 Other malaise: Secondary | ICD-10-CM

## 2012-02-18 DIAGNOSIS — R5383 Other fatigue: Secondary | ICD-10-CM

## 2012-02-18 DIAGNOSIS — I6340 Cerebral infarction due to embolism of unspecified cerebral artery: Secondary | ICD-10-CM

## 2012-02-18 LAB — GLUCOSE, CAPILLARY
Glucose-Capillary: 349 mg/dL — ABNORMAL HIGH (ref 70–99)
Glucose-Capillary: 226 mg/dL — ABNORMAL HIGH (ref 70–99)
Glucose-Capillary: 185 mg/dL — ABNORMAL HIGH (ref 70–99)
Glucose-Capillary: 202 mg/dL — ABNORMAL HIGH (ref 70–99)
Glucose-Capillary: 133 mg/dL — ABNORMAL HIGH (ref 70–99)

## 2012-02-18 MED ORDER — INSULIN ASPART 100 UNIT/ML ~~LOC~~ SOLN
3.0000 [IU] | Freq: Three times a day (TID) | SUBCUTANEOUS | Status: DC
  Administered 2012-02-18 – 2012-02-19 (×4): 3 [IU] via SUBCUTANEOUS

## 2012-02-18 MED ORDER — ACETAMINOPHEN 325 MG PO TABS: 650.0000 mg | ORAL_TABLET | Freq: Four times a day (QID) | ORAL | Status: DC | PRN

## 2012-02-18 MED ORDER — INSULIN ASPART 100 UNIT/ML ~~LOC~~ SOLN: 3.0000 [IU] | Freq: Once | SUBCUTANEOUS | Status: DC

## 2012-02-18 MED ORDER — INSULIN ASPART 100 UNIT/ML ~~LOC~~ SOLN
3.0000 [IU] | Freq: Once | SUBCUTANEOUS | Status: AC
  Administered 2012-02-18: 3 [IU] via SUBCUTANEOUS

## 2012-02-18 MED ORDER — INSULIN ASPART PROT & ASPART (70-30 MIX) 100 UNIT/ML ~~LOC~~ SUSP
20.0000 [IU] | Freq: Two times a day (BID) | SUBCUTANEOUS | Status: DC
  Administered 2012-02-18 – 2012-02-19 (×2): 20 [IU] via SUBCUTANEOUS
  Filled 2012-02-18: qty 3

## 2012-02-18 NOTE — Progress Notes (Signed)
Subjective: Pt sitting comfortably in chair.  No new c/o. Still has slight dysphagia. Neck US yesterday shows enlarged thyroid with multiple nodules and masses. The radiologist suspects the left lobe is completely replaced by a single mass.    Objective: Vital signs in last 24 hours: Temp:  [98 F (36.7 C)-98.4 F (36.9 C)] 98.1 F (36.7 C) (06/08 0433) Pulse Rate:  [67-73] 67  (06/08 0433) Resp:  [20] 20  (06/07 2156) BP: (138-175)/(76-88) 138/76 mmHg (06/08 0433) SpO2:  [96 %-99 %] 96 % (06/08 0433) Weight:  [89.5 kg (197 lb 5 oz)] 89.5 kg (197 lb 5 oz) (06/08 0059)  Physical Exam  Constitutional: She is oriented to person, place, and time. She appears well-developed and well-nourished.  HENT:  Head: Normocephalic and atraumatic.  Ears: Auricles and ear canals are normal.  Nose: Nose normal.  Mouth/Throat: Mucosa intact. No lesion present. Voice is strong.  Eyes: Conjunctivae are normal. Pupils are equal, round, and reactive to light. No discharge.  Neck: Neck supple. Palpable enlarged thyroid lobes noted, larger on the left. No other significant LAD.  Cardiovascular: Normal rate and regular rhythm.  Musculoskeletal: She exhibits no edema.  Neurological: She is alert and oriented to person, place, and time. CN 2-12 grossly intact.  Skin: Skin is warm and dry.  Psychiatric: She has a normal mood and affect. Her speech is normal and behavior is normal.    Basename 02/17/12 0454 02/16/12 0200  WBC 6.5 8.5  HGB 11.9* 14.4  HCT 35.9* 42.0  PLT 150 174    Basename 02/17/12 0454 02/16/12 1329 02/16/12 0200  NA 137 -- 139  K 3.9 3.5 --  CL 99 -- 98  CO2 28 -- 30  GLUCOSE 207* -- 215*  BUN 16 -- 7  CREATININE 0.98 -- 0.73  CALCIUM 9.6 -- 9.0    Medications:  I have reviewed the patient's current medications. Scheduled:   . atorvastatin  10 mg Oral q1800  . clopidogrel  75 mg Oral Q breakfast  . hydrALAZINE      . insulin aspart  0-8 Units Subcutaneous TID WC  .  insulin aspart  3 Units Subcutaneous Once  . insulin aspart  4 Units Subcutaneous Once  . insulin aspart  4 Units Subcutaneous Once  . insulin aspart protamine-insulin aspart  20 Units Subcutaneous BID WC  . nebivolol  10 mg Oral Daily  . potassium chloride  20 mEq Oral Daily  . sodium chloride  3 mL Intravenous Q12H  . DISCONTD: insulin aspart protamine-insulin aspart  20 Units Subcutaneous Q breakfast   NWG:NFAOZH chloride, acetaminophen, hydrALAZINE, sodium chloride  Assessment/Plan: Enlarged thyroid with multiple nodules and masses. The patient is currently symptomatic with dysphagia and globus sensation. Awaiting US-guided FNA of the dominant left thyroid mass. The patient will likely need to undergo total thyroidectomy once she is medically cleared. Pt's daughters are in her room today. The risks, benefits, and details of the procedure are discussed with the patient and her daughters.    LOS: 3 days   Jaysie Benthall,SUI W 02/18/2012, 1:15 PM

## 2012-02-18 NOTE — Progress Notes (Addendum)
02-18-12  NSG:  A majority of my first 4 hours this shift has been spent answering this family's questions and on education re: HTN,  Uncontrolled Diabetes chronic and acute s/s 7 compoications, CVA,  labs, medication side effects vs adverse reaction, skin protection, turning, pulm toilet, and activity, PAS hose, written information given, verbal, TV video suggested, teach back done,  family does not really seem to be grasping much of this information,  Seem to be focusing on a time in the ER when her BP and HR dropped this admission after initial medications and a ER visit back in 2008.   Will require much reinforcement of education.  MD will you clarify if pt ever had fluid overload, in some places in the notes it mentions generalized and BLE  Then not in others, It is noted that she had significant diuresis in the Ed, BNP 1818 but cardiac note says below 10,000 non specific for CHF but that it remains a  Clinical dx, and one of her care plans,  cxr no acute findings, no jugular vein distention is noted but then family said she did have it.  Have given pt/family information about CHF b/c she would be high risk but please clarify if she did or did not have it upon admission.

## 2012-02-18 NOTE — Progress Notes (Signed)
Subjective: Patient has no new complaints.  Working with therapy.  Recommendation has been made for inpatient rehab.  Patient is discussing with family.  Echo and carotid dopplers have been reviewed and are unremarkable.    Objective: Current vital signs: BP 138/76  Pulse 67  Temp(Src) 98.1 F (36.7 C) (Oral)  Resp 20  Ht 5\' 7"  (1.702 m)  Wt 89.5 kg (197 lb 5 oz)  BMI 30.90 kg/m2  SpO2 96% Vital signs in last 24 hours: Temp:  [98 F (36.7 C)-98.4 F (36.9 C)] 98.1 F (36.7 C) (06/08 0433) Pulse Rate:  [67-73] 67  (06/08 0433) Resp:  [20] 20  (06/07 2156) BP: (138-175)/(76-88) 138/76 mmHg (06/08 0433) SpO2:  [96 %-99 %] 96 % (06/08 0433) Weight:  [89.5 kg (197 lb 5 oz)] 89.5 kg (197 lb 5 oz) (06/08 0059)  Intake/Output from previous day: 06/07 0701 - 06/08 0700 In: 620 [P.O.:620] Out: 1250 [Urine:1250] Intake/Output this shift: Total I/O In: 360 [P.O.:360] Out: -  Nutritional status: Cardiac  Neurologic Exam: Mental Status:  Alert, oriented, tangential. Speech fluent without evidence of aphasia. Able to follow 3 step commands without difficulty.  Cranial Nerves:  II: visual fields grossly normal, pupils equal, round, reactive to light and accommodation  III,IV, VI: ptosis not present, extra-ocular motions intact bilaterally  V,VII: smile symmetric, facial light touch sensation normal bilaterally  VIII: hearing normal bilaterally  IX,X: gag reflex present  XI: trapezius strength/neck flexion strength normal bilaterally  XII: tongue strength normal  Motor:  Right : Upper extremity Decreased ROM at shoulder w/3-4/5 hand grip   Left: Upper extremity 5/5             Lower extremity 5-/5                                                                                                Lower extremity 5-/5  Tone and bulk:normal tone throughout; no atrophy noted  Sensory: Pinprick and light touch intact throughout, bilaterally  Deep Tendon Reflexes: 2+ in the upper extremities,  trace at the knees and absent at the ankles  Plantars:  Right: equivocal    Left: downgoing  Cerebellar:  normal finger-to-nose and normal heel-to-shin test   Lab Results: Results for orders placed during the hospital encounter of 02/15/12 (from the past 48 hour(s))  GLUCOSE, CAPILLARY     Status: Abnormal   Collection Time   02/16/12  6:11 PM      Component Value Range Comment   Glucose-Capillary 67 (*) 70 - 99 (mg/dL)   CARDIAC PANEL(CRET KIN+CKTOT+MB+TROPI)     Status: Abnormal   Collection Time   02/16/12  6:25 PM      Component Value Range Comment   Total CK 217 (*) 7 - 177 (U/L)    CK, MB 2.4  0.3 - 4.0 (ng/mL)    Troponin I <0.30  <0.30 (ng/mL)    Relative Index 1.1  0.0 - 2.5    GLUCOSE, CAPILLARY     Status: Abnormal   Collection Time   02/16/12  6:57 PM      Component  Value Range Comment   Glucose-Capillary 183 (*) 70 - 99 (mg/dL)   GLUCOSE, CAPILLARY     Status: Abnormal   Collection Time   02/16/12  9:11 PM      Component Value Range Comment   Glucose-Capillary 243 (*) 70 - 99 (mg/dL)    Comment 1 Documented in Chart      Comment 2 Notify RN     GLUCOSE, CAPILLARY     Status: Abnormal   Collection Time   02/16/12 11:03 PM      Component Value Range Comment   Glucose-Capillary 251 (*) 70 - 99 (mg/dL)    Comment 1 Documented in Chart      Comment 2 Notify RN     GLUCOSE, CAPILLARY     Status: Abnormal   Collection Time   02/17/12  1:10 AM      Component Value Range Comment   Glucose-Capillary 209 (*) 70 - 99 (mg/dL)   GLUCOSE, CAPILLARY     Status: Abnormal   Collection Time   02/17/12  3:01 AM      Component Value Range Comment   Glucose-Capillary 203 (*) 70 - 99 (mg/dL)   CBC     Status: Abnormal   Collection Time   02/17/12  4:54 AM      Component Value Range Comment   WBC 6.5  4.0 - 10.5 (K/uL)    RBC 4.22  3.87 - 5.11 (MIL/uL)    Hemoglobin 11.9 (*) 12.0 - 15.0 (g/dL)    HCT 16.1 (*) 09.6 - 46.0 (%)    MCV 85.1  78.0 - 100.0 (fL)    MCH 28.2  26.0 - 34.0  (pg)    MCHC 33.1  30.0 - 36.0 (g/dL)    RDW 04.5  40.9 - 81.1 (%)    Platelets 150  150 - 400 (K/uL)   BASIC METABOLIC PANEL     Status: Abnormal   Collection Time   02/17/12  4:54 AM      Component Value Range Comment   Sodium 137  135 - 145 (mEq/L)    Potassium 3.9  3.5 - 5.1 (mEq/L)    Chloride 99  96 - 112 (mEq/L)    CO2 28  19 - 32 (mEq/L)    Glucose, Bld 207 (*) 70 - 99 (mg/dL)    BUN 16  6 - 23 (mg/dL)    Creatinine, Ser 9.14  0.50 - 1.10 (mg/dL)    Calcium 9.6  8.4 - 10.5 (mg/dL)    GFR calc non Af Amer 57 (*) >90 (mL/min)    GFR calc Af Amer 66 (*) >90 (mL/min)   LIPID PANEL     Status: Abnormal   Collection Time   02/17/12  4:54 AM      Component Value Range Comment   Cholesterol 194  0 - 200 (mg/dL)    Triglycerides 76  <782 (mg/dL)    HDL 47  >95 (mg/dL)    Total CHOL/HDL Ratio 4.1      VLDL 15  0 - 40 (mg/dL)    LDL Cholesterol 621 (*) 0 - 99 (mg/dL)   GLUCOSE, CAPILLARY     Status: Abnormal   Collection Time   02/17/12  5:31 AM      Component Value Range Comment   Glucose-Capillary 195 (*) 70 - 99 (mg/dL)   GLUCOSE, CAPILLARY     Status: Abnormal   Collection Time   02/17/12  7:45 AM  Component Value Range Comment   Glucose-Capillary 183 (*) 70 - 99 (mg/dL)    Comment 1 Notify RN     GLUCOSE, CAPILLARY     Status: Abnormal   Collection Time   02/17/12  9:09 AM      Component Value Range Comment   Glucose-Capillary 254 (*) 70 - 99 (mg/dL)    Comment 1 Notify RN     GLUCOSE, CAPILLARY     Status: Abnormal   Collection Time   02/17/12 11:43 AM      Component Value Range Comment   Glucose-Capillary 239 (*) 70 - 99 (mg/dL)    Comment 1 Notify RN     MAGNESIUM     Status: Normal   Collection Time   02/17/12 12:55 PM      Component Value Range Comment   Magnesium 1.5  1.5 - 2.5 (mg/dL)   TSH     Status: Normal   Collection Time   02/17/12 12:55 PM      Component Value Range Comment   TSH 0.538  0.350 - 4.500 (uIU/mL)   GLUCOSE, CAPILLARY     Status: Abnormal     Collection Time   02/17/12  5:37 PM      Component Value Range Comment   Glucose-Capillary 232 (*) 70 - 99 (mg/dL)   GLUCOSE, CAPILLARY     Status: Abnormal   Collection Time   02/17/12  9:44 PM      Component Value Range Comment   Glucose-Capillary 375 (*) 70 - 99 (mg/dL)   GLUCOSE, CAPILLARY     Status: Abnormal   Collection Time   02/17/12 11:22 PM      Component Value Range Comment   Glucose-Capillary 344 (*) 70 - 99 (mg/dL)   GLUCOSE, CAPILLARY     Status: Abnormal   Collection Time   02/18/12  7:27 AM      Component Value Range Comment   Glucose-Capillary 241 (*) 70 - 99 (mg/dL)   GLUCOSE, CAPILLARY     Status: Abnormal   Collection Time   02/18/12 11:47 AM      Component Value Range Comment   Glucose-Capillary 349 (*) 70 - 99 (mg/dL)     No results found for this or any previous visit (from the past 240 hour(s)).  Lipid Panel  Basename 02/17/12 0454  CHOL 194  TRIG 76  HDL 47  CHOLHDL 4.1  VLDL 15  LDLCALC 161*    Studies/Results: Mr Laqueta Jean Wo Contrast  02/16/2012  *RADIOLOGY REPORT*  Clinical Data: Diabetic hypertensive with a history of multiple sclerosis presenting with weakness and difficulty ambulating.  MRI HEAD WITHOUT AND WITH CONTRAST  Technique:  Multiplanar, multiecho pulse sequences of the brain and surrounding structures were obtained according to standard protocol without and with intravenous contrast  Contrast: 17mL MULTIHANCE GADOBENATE DIMEGLUMINE 529 MG/ML IV SOLN  Comparison: 02/16/2012 CT. 05/27/2007 MR.  Findings: Areas of restricted motion within the right frontal lobe more consistent with acute infarcts than restricted motion related to active MS plaques.  This includes infarct involving the right caudate head and the anterior aspect of the right lenticular nucleus with smaller scattered infarcts throughout the right frontal lobe.  Remote small infarcts of the basal ganglia, thalami and cerebellum bilaterally.  Prominent white matter type changes fairly  confluent in the periventricular region having progressed since prior examination. This may represent combination of result of multiple sclerosis and / or small vessel disease.  Global atrophy.  Ventricular prominence  felt to be related to atrophy rather hydrocephalus.  No intracranial mass.  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  Ectatic vertebral arteries and basilar artery. Basilar artery causes impression upon the undersurface of the hypothalamus. Ectatic patent carotid arteries.  Mild exophthalmos.  Minimal paranasal sinus mucosal thickening.  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  Remote small infarcts of the basal ganglia, thalami and cerebellum bilaterally.  Prominent white matter type changes fairly confluent in the periventricular region having progressed since prior examination. This may represent combination of result of multiple sclerosis and / or small vessel disease.  Global atrophy.  This has been made a PRA call report utilizing dashboard call feature.  Original Report Authenticated By: Fuller Canada, M.D.   Mr Cervical Spine W Wo Contrast  02/16/2012  *RADIOLOGY REPORT*  Clinical Data: Diabetic hypertensive with multiple sclerosis and difficulty walking.  MRI CERVICAL SPINE WITHOUT AND WITH CONTRAST  Technique:  Multiplanar and multiecho pulse sequences of the cervical spine, to include the craniocervical junction and cervicothoracic junction, were obtained according to standard protocol without and with intravenous contrast.  Contrast:  17 ml MultiHance.  Comparison: MR brain performed the same date.  No comparison cervical spine MR.  Findings: Markedly enlarged heterogeneous thyroid gland with dominant mass on the left displacing surrounding airway and vascular structures.  Thyroid ultrasound  recommended for further delineation.  No focal cervical cord signal abnormality or enhancement detected on this motion degraded exam.  C2-3:  Minimal bulge.  C3-4:  Mild bulge.  Minimal left uncinate bony overgrowth and foraminal narrowing.  C4-5:  Moderate broad-based protrusion.  Moderate spinal stenosis and mild cord flattening.  Uncinate bony overgrowth.  Moderate foraminal narrowing greater on the right.  C5-6:  Small to moderate broad-based right posterior lateral disc protrusion and osteophyte with mild compression of the right aspect of the cord.  Uncinate bony overgrowth.  Moderate to marked right- sided and mild left-sided foraminal narrowing.  C6-7:  Minimal bulge.  C7-T1:  Mild facet joint degenerative changes.  Minimal bulge.  T1-2:  Mild facet joint degenerative changes.  Minimal bulge.  T2-3:  Suggestion of posterior lateral tiny protrusion greater on the right.  Axial images not obtained.  IMPRESSION: No focal cord signal abnormality or abnormal enhancement noted on this motion degraded exam.  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  Markedly enlarged thyroid gland with mass greater on the left compressing surrounding air column and vascular structures. Thyroid ultrasound recommended for further delineation.  This has been made a PRA call report utilizing dashboard call feature.  Original Report Authenticated By: Fuller Canada, M.D.   US Soft Tissue Head/neck  02/17/2012  *RADIOLOGY REPORT*  Clinical Data: Abnormal MRI.  Thyroid mass.  THYROID ULTRASOUND  Technique: Ultrasound examination of the thyroid gland and adjacent soft tissues was performed.  Comparison:  MRI of the cervical spine 02/16/2012  Findings:  Right thyroid lobe:  6.4 x 4.0 x 2.0 cm. Left thyroid lobe:  8.9 x 4.0 x 5.6 cm. Isthmus:  2.4 cm.  Focal nodules:  Multiple complex solid and cystic and solid heterogeneous nodules within the right thyroid lobe, including a 2.3 cm complex cystic and  solid nodule in the upper pole, 2.6 cm solid nodule in the mid pole, and 2.4 cm complex lesion in  the lower pole.  Much of the left lobe is likely replaced by a large heterogeneous solid mass, difficult to measure.  Lymphadenopathy:  None visualized.  IMPRESSION: Enlarged thyroid with multiple nodules and masses.  I suspect the left lobe is completely replaced by a single mass.  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  Recommend tissue sampling of the left thyroid lobe.  Original Report Authenticated By: Cyndie Chime, M.D.    Medications:  I have reviewed the patient's current medications. Scheduled:   . atorvastatin  10 mg Oral q1800  . clopidogrel  75 mg Oral Q breakfast  . hydrALAZINE      . insulin aspart  0-8 Units Subcutaneous TID WC  . insulin aspart  3 Units Subcutaneous Once  . insulin aspart  4 Units Subcutaneous Once  . insulin aspart  4 Units Subcutaneous Once  . insulin aspart protamine-insulin aspart  20 Units Subcutaneous BID WC  . nebivolol  10 mg Oral Daily  . potassium chloride  20 mEq Oral Daily  . sodium chloride  3 mL Intravenous Q12H  . DISCONTD: insulin aspart protamine-insulin aspart  20 Units Subcutaneous Q breakfast    Assessment/Plan:  Patient Active Hospital Problem List: Acute Infarct   Assessment:  Work up only remarkable for elevated LDL.  Has been placed on Lipitor.     Plan:  Agree with therapy.  No further neurologic intervention is recommended at this time.  If further questions arise, please call or page at that time.  Thank you for allowing neurology to participate in the care of this patient.    LOS: 3 days   Thana Farr, MD [facility] [phone] 02/18/2012  1:30 PM

## 2012-02-18 NOTE — Progress Notes (Signed)
[facility]                                                                                 Patient Demographics  Hannah Lamb, is a 71 y.o. female  [id number]:[id number]  [id number]  DOB - February 23, 1941  Admit date - 02/15/2012  Admitting Physician Cristal Ford, MD  Outpatient Primary MD for the patient is No primary provider on file.  LOS - 3    Chief Complaint  Patient presents with  . Shoulder Pain        Subjective:   Roderic Palau today has, No headache, No chest pain, No abdominal pain - No Nausea, No new weakness tingling or numbness, No Cough - SOB.   Objective:   Filed Vitals:   02/17/12 2156 02/18/12 0045 02/18/12 0059 02/18/12 0433  BP: 174/76 153/79  138/76  Pulse: 70 73  67  Temp: 98 F (36.7 C)   98.1 F (36.7 C)  TempSrc: Oral   Oral  Resp: 20     Height:      Weight:   89.5 kg (197 lb 5 oz)   SpO2: 97%   96%    Wt Readings from Last 3 Encounters:  02/18/12 89.5 kg (197 lb 5 oz)     Intake/Output Summary (Last 24 hours) at 02/18/12 1109 Last data filed at 02/18/12 0837  Gross per 24 hour  Intake    720 ml  Output    950 ml  Net   -230 ml    Exam Awake Alert, Oriented *3, No new F.N deficits, Normal affect, generalized weakness lower extremities more than upper worse in the left lower leg strength 4 x 5 in lower extremity 5 x 5 in upper extremity. [place].AT,PERRAL Supple Neck,No JVD, No cervical lymphadenopathy appriciated.  Symmetrical Chest wall movement, Good air movement bilaterally, CTAB RRR,No Gallops,Rubs or new Murmurs, No Parasternal Heave +ve B.Sounds, Abd Soft, Non tender, No organomegaly appriciated, No rebound -guarding or rigidity. No Cyanosis, Clubbing or edema, No new Rash or bruise  Data Review  CBC  Lab 02/17/12 0454 02/16/12 0200 02/15/12 2145  WBC 6.5 8.5 6.3  HGB 11.9* 14.4 13.7  HCT 35.9* 42.0 40.1  PLT 150 174 185  MCV 85.1 83.7 84.4  MCH 28.2 28.7 28.8  MCHC 33.1 34.3 34.2  RDW 14.3 13.7 13.6    LYMPHSABS -- -- 2.1  MONOABS -- -- 0.3  EOSABS -- -- 0.1  BASOSABS -- -- 0.0  BANDABS -- -- --    Chemistries   Lab 02/17/12 1255 02/17/12 0454 02/16/12 1329 02/16/12 0200 02/15/12 2145  NA -- 137 -- 139 140  K -- 3.9 3.5 2.6* 3.3*  CL -- 99 -- 98 100  CO2 -- 28 -- 30 32  GLUCOSE -- 207* -- 215* 184*  BUN -- 16 -- 7 8  CREATININE -- 0.98 -- 0.73 0.61  CALCIUM -- 9.6 -- 9.0 9.5  MG 1.5 -- -- 1.5 --  AST -- -- -- -- 16  ALT -- -- -- -- 9  ALKPHOS -- -- -- -- 60  BILITOT -- -- -- -- 0.3   ------------------------------------------------------------------------------------------------------------------ estimated creatinine clearance is 60.5  ml/min (by C-G formula based on Cr of 0.98). ------------------------------------------------------------------------------------------------------------------  Basename 02/16/12 0200  HGBA1C 10.5*   ------------------------------------------------------------------------------------------------------------------  Basename 02/17/12 0454  CHOL 194  HDL 47  LDLCALC 132*  TRIG 76  CHOLHDL 4.1  LDLDIRECT --   ------------------------------------------------------------------------------------------------------------------  Basename 02/17/12 1255  TSH 0.538  T4TOTAL --  T3FREE --  THYROIDAB --   ------------------------------------------------------------------------------------------------------------------ No results found for this basename: VITAMINB12:2,FOLATE:2,FERRITIN:2,TIBC:2,IRON:2,RETICCTPCT:2 in the last 72 hours  Coagulation profile No results found for this basename: INR:5,PROTIME:5 in the last 168 hours  No results found for this basename: DDIMER:2 in the last 72 hours  Cardiac Enzymes  Lab 02/16/12 1825 02/16/12 0959 02/16/12 0200  CKMB 2.4 2.4 2.7  TROPONINI <0.30 <0.30 <0.30  MYOGLOBIN -- -- --    ------------------------------------------------------------------------------------------------------------------ No components found with this basename: POCBNP:3  Micro Results No results found for this or any previous visit (from the past 240 hour(s)).  Radiology Reports Dg Chest 2 View  02/15/2012  *RADIOLOGY REPORT*  Clinical Data: Weakness.  CHEST - 2 VIEW  Comparison: 04/10/2006.  Findings: The heart is mildly enlarged but stable.  There is tortuosity of the thoracic aorta.  The lungs are clear of acute process.  Mild chronic bronchitic changes.  No pleural effusion or pneumothorax.  The bony thorax is intact.  IMPRESSION: Mild cardiac enlargement, stable. Chronic bronchitic type changes but no acute pulmonary findings.  Original Report Authenticated By: P. Loralie Champagne, M.D.   Dg Shoulder Right  02/15/2012  *RADIOLOGY REPORT*  Clinical Data: Right shoulder pain.  RIGHT SHOULDER - 2+ VIEW  Comparison: None  Findings: The joint spaces are maintained.  Mild glenohumeral and AC joint degenerative changes.  No acute fracture or abnormal soft tissue calcifications.  The right lung apex is clear.  The right upper ribs are intact.  IMPRESSION: Mild degenerative changes but no fracture or dislocation.  Original Report Authenticated By: P. Loralie Champagne, M.D.   Ct Head Wo Contrast  02/16/2012  *RADIOLOGY REPORT*  Clinical Data: Weakness.  CT HEAD WITHOUT CONTRAST  Technique:  Contiguous axial images were obtained from the base of the skull through the vertex without contrast.  Comparison: Head CT 05/27/2007.  Brain MRI 05/27/2007.  Findings: The there are multiple well defined foci of low attenuation throughout the basal ganglia bilaterally, similar to the prior study from 05/27/2007, compatible with old lacunar infarctions.  Mild cerebral and cerebellar atrophy is noted.  Old cerebellar infarctions in the right cerebellar hemisphere are also noted.  There are extensive patchy and confluent areas of  decreased attenuation throughout the deep and periventricular white matter of the cerebral hemispheres bilaterally, consistent with chronic microvascular ischemic disease.  No definite acute intracranial abnormalities.  Specifically, no overt signs of acute/subacute cerebral ischemia, no acute intracranial hemorrhage, no focal mass, mass effect or hydrocephalous.  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  No acute displaced skull fractures are identified.  Visualized paranasal sinuses and mastoids are well pneumatized.  IMPRESSION: 1.  No acute intracranial abnormalities. 2.  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  In addition, there is extensive chronic microvascular ischemic disease of the white matter. 3.  Mild cerebral and cerebellar atrophy.  Original Report Authenticated By: Florencia Reasons, M.D.   Mr Laqueta Jean Wo Contrast  02/16/2012  *RADIOLOGY REPORT*  Clinical Data: Diabetic hypertensive with a history of multiple sclerosis presenting with weakness and difficulty ambulating.  MRI HEAD WITHOUT AND WITH CONTRAST  Technique:  Multiplanar, multiecho pulse sequences of the brain and surrounding structures were obtained according to standard protocol without and with intravenous contrast  Contrast: 17mL MULTIHANCE GADOBENATE DIMEGLUMINE 529 MG/ML IV SOLN  Comparison: 02/16/2012 CT. 05/27/2007 MR.  Findings: Areas of restricted motion within the right frontal lobe more consistent with acute infarcts than restricted motion related to active MS plaques.  This includes infarct involving the right caudate head and the anterior aspect of the right lenticular nucleus with smaller scattered infarcts throughout the right frontal lobe.  Remote small infarcts of the basal ganglia, thalami and cerebellum bilaterally.  Prominent white matter type changes fairly confluent in the periventricular region having progressed since prior  examination. This may represent combination of result of multiple sclerosis and / or small vessel disease.  Global atrophy.  Ventricular prominence felt to be related to atrophy rather hydrocephalus.  No intracranial mass.  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  Ectatic vertebral arteries and basilar artery. Basilar artery causes impression upon the undersurface of the hypothalamus. Ectatic patent carotid arteries.  Mild exophthalmos.  Minimal paranasal sinus mucosal thickening.  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  Remote small infarcts of the basal ganglia, thalami and cerebellum bilaterally.  Prominent white matter type changes fairly confluent in the periventricular region having progressed since prior examination. This may represent combination of result of multiple sclerosis and / or small vessel disease.  Global atrophy.  This has been made a PRA call report utilizing dashboard call feature.  Original Report Authenticated By: Fuller Canada, M.D.   Mr Cervical Spine W Wo Contrast  02/16/2012  *RADIOLOGY REPORT*  Clinical Data: Diabetic hypertensive with multiple sclerosis and difficulty walking.  MRI CERVICAL SPINE WITHOUT AND WITH CONTRAST  Technique:  Multiplanar and multiecho pulse sequences of the cervical spine, to include the craniocervical junction and cervicothoracic junction, were obtained according to standard protocol without and with intravenous contrast.  Contrast:  17 ml MultiHance.  Comparison: MR brain performed the same date.  No comparison cervical spine MR.  Findings: Markedly enlarged heterogeneous thyroid gland with dominant mass on the left displacing surrounding airway and vascular structures.  Thyroid ultrasound recommended for further delineation.  No focal cervical cord signal  abnormality or enhancement detected on this motion degraded exam.  C2-3:  Minimal bulge.  C3-4:  Mild bulge.  Minimal left uncinate bony overgrowth and foraminal narrowing.  C4-5:  Moderate broad-based protrusion.  Moderate spinal stenosis and mild cord flattening.  Uncinate bony overgrowth.  Moderate foraminal narrowing greater on the right.  C5-6:  Small to moderate broad-based right posterior lateral disc protrusion and osteophyte with mild compression of the right aspect of the cord.  Uncinate bony overgrowth.  Moderate to marked right- sided and mild left-sided foraminal narrowing.  C6-7:  Minimal bulge.  C7-T1:  Mild facet joint degenerative changes.  Minimal bulge.  T1-2:  Mild facet joint degenerative changes.  Minimal bulge.  T2-3:  Suggestion of posterior lateral tiny protrusion greater on the right.  Axial images not obtained.  IMPRESSION: No focal cord signal abnormality or abnormal enhancement noted on this motion degraded exam.  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  Markedly enlarged thyroid gland with mass greater on the left compressing surrounding air column and vascular structures. Thyroid ultrasound recommended for further  delineation.  This has been made a PRA call report utilizing dashboard call feature.  Original Report Authenticated By: Fuller Canada, M.D.   US Soft Tissue Head/neck  02/17/2012  *RADIOLOGY REPORT*  Clinical Data: Abnormal MRI.  Thyroid mass.  THYROID ULTRASOUND  Technique: Ultrasound examination of the thyroid gland and adjacent soft tissues was performed.  Comparison:  MRI of the cervical spine 02/16/2012  Findings:  Right thyroid lobe:  6.4 x 4.0 x 2.0 cm. Left thyroid lobe:  8.9 x 4.0 x 5.6 cm. Isthmus:  2.4 cm.  Focal nodules:  Multiple complex solid and cystic and solid heterogeneous nodules within the right thyroid lobe, including a 2.3 cm complex cystic and solid nodule in the upper pole, 2.6 cm solid nodule in the mid pole,  and 2.4 cm complex lesion in the lower pole.  Much of the left lobe is likely replaced by a large heterogeneous solid mass, difficult to measure.  Lymphadenopathy:  None visualized.  IMPRESSION: Enlarged thyroid with multiple nodules and masses.  I suspect the left lobe is completely replaced by a single mass.  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  Recommend tissue sampling of the left thyroid lobe.  Original Report Authenticated By: Cyndie Chime, M.D.    Carotid Duplex  No significant extracranial carotid artery stenosis demonstrated. Vertebrals are patent with antegrade flow.    Echo  Left ventricle: The cavity size was normal. Wall thickness was increased in a pattern of moderate LVH. Systolic function was vigorous. The estimated ejection fraction was in the range of 65% to 70%. Doppler parameters are consistent with abnormal left ventricular relaxation (grade 1 diastolic dysfunction).     Scheduled Meds:   . atorvastatin  10 mg Oral q1800  . clopidogrel  75 mg Oral Q breakfast  . hydrALAZINE      . insulin aspart  0-8 Units Subcutaneous TID WC  . insulin aspart  3 Units Subcutaneous Once  . insulin aspart  4 Units Subcutaneous Once  . insulin aspart  4 Units Subcutaneous Once  . insulin aspart protamine-insulin aspart  20 Units Subcutaneous BID WC  . nebivolol  10 mg Oral Daily  . potassium chloride  20 mEq Oral Daily  . sodium chloride  3 mL Intravenous Q12H  . DISCONTD: insulin aspart protamine-insulin aspart  20 Units Subcutaneous Q breakfast  . DISCONTD: lisinopril  5 mg Oral Daily   Continuous Infusions:  PRN Meds:.sodium chloride, acetaminophen, hydrALAZINE, sodium chloride  Assessment & Plan   1. 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 Continue  risk factor modification. Neurology following, echo and carotid duplex stable.    2. History of MS no signs of acute flare outpatient monitoring.    3. Hypertension stable continue present medications blood pressure was high upon admission secondary to CVA.   4. Diabetes mellitus type 2 sugars are very labile patient despite much than lower home dose NPH had episodes of hypoglycemia on day one, now sugars are high, we will gently increase NPH to twice a day dose but still lower than home dose, continue sliding scale insulin with meals, continue close monitoring of CBGs.   Lab Results  Component Value Date   HGBA1C 10.5* 02/16/2012    CBG (last 3)   Basename 02/18/12 9147 02/17/12 2322 02/17/12 2144  GLUCAP 241* 344* 375*     5. Dyslipidemia patient initiated on statin.    6. History of thyroid mass appreciated ENT input, TSH is stable, patient scheduled for ultrasound-guided biopsy then outpatient ENT followup.     7. Abnormal EKG. Patient is pain-free, ruled out for MI, on Plavix, statin, beta blocker, echo stable, appreciate cardiology input. No further inpatient testing suggested by cardiology.    DVT Prophylaxis   SCDs    Procedures CT brain, MRI brain and neck, echogram, carotid duplex, ultrasound-guided thyroid biopsy pending.    Consults neurology, ENT, PT OT speech   Leroy Sea M.D on 02/18/2012 at 11:09 AM  Between 7am to 7pm - Pager - [phone]  After 7pm go to [email or web address] - password [email or web address]  And look for the night coverage person covering for me after hours  [facility] Office  [phone]

## 2012-02-19 ENCOUNTER — Inpatient Hospital Stay (HOSPITAL_COMMUNITY): Payer: Medicare Other

## 2012-02-19 DIAGNOSIS — I10 Essential (primary) hypertension: Secondary | ICD-10-CM

## 2012-02-19 DIAGNOSIS — R5381 Other malaise: Secondary | ICD-10-CM

## 2012-02-19 DIAGNOSIS — E1165 Type 2 diabetes mellitus with hyperglycemia: Secondary | ICD-10-CM

## 2012-02-19 DIAGNOSIS — G35 Multiple sclerosis: Secondary | ICD-10-CM

## 2012-02-19 DIAGNOSIS — R5383 Other fatigue: Secondary | ICD-10-CM

## 2012-02-19 DIAGNOSIS — R609 Edema, unspecified: Secondary | ICD-10-CM

## 2012-02-19 LAB — GLUCOSE, CAPILLARY: Glucose-Capillary: 78 mg/dL (ref 70–99)

## 2012-02-19 LAB — CK: Total CK: 93 U/L (ref 7–177)

## 2012-02-19 MED ORDER — AMLODIPINE BESYLATE 10 MG PO TABS
10.0000 mg | ORAL_TABLET | Freq: Every day | ORAL | Status: DC
  Filled 2012-02-19 (×2): qty 1

## 2012-02-19 MED ORDER — SODIUM CHLORIDE 0.9 % IV SOLN: 250.0000 mL | INTRAVENOUS | Status: AC | PRN

## 2012-02-19 MED ORDER — AMLODIPINE BESYLATE 5 MG PO TABS
5.0000 mg | ORAL_TABLET | Freq: Every day | ORAL | Status: DC
  Filled 2012-02-19: qty 1

## 2012-02-19 MED ORDER — NIACIN 50 MG PO TABS
50.0000 mg | ORAL_TABLET | Freq: Two times a day (BID) | ORAL | Status: DC
  Filled 2012-02-19 (×6): qty 1

## 2012-02-19 MED ORDER — INSULIN ASPART PROT & ASPART (70-30 MIX) 100 UNIT/ML ~~LOC~~ SUSP
22.0000 [IU] | Freq: Two times a day (BID) | SUBCUTANEOUS | Status: DC
  Administered 2012-02-19 – 2012-02-20 (×2): 22 [IU] via SUBCUTANEOUS
  Filled 2012-02-19: qty 3

## 2012-02-19 NOTE — Progress Notes (Signed)
Dr. Thedore Mins paged for the family due to questions regarding CT angio.  Dr. Thedore Mins spoke with the pt's daughter and at this time the patient and family are refusing to have the CT angio to be done. Will continue to monitor pt.Maeola Harman

## 2012-02-19 NOTE — Progress Notes (Signed)
Pt's family refused for their mother to receive niacin and norvasc this morning. Hannah Lamb

## 2012-02-19 NOTE — Progress Notes (Signed)
Hannah Lamb  71 y.o.  female  Subjective: Patient reports weakness or discomfort in her right shoulder and inability to walk. Memory and mentation appear to be a bit slowed without formal testing. She denies dyspnea, chest discomfort or palpitations.  Allergy: Sulfa drugs cross reactors and Lipitor  Objective: Vital signs in last 24 hours: Temp:  [97.5 F (36.4 C)-98.2 F (36.8 C)] 97.5 F (36.4 C) (06/09 0528) Pulse Rate:  [62-68] 62  (06/09 0528) Resp:  [18-20] 18  (06/09 0528) BP: (138-176)/(77-88) 162/77 mmHg (06/09 0528) SpO2:  [97 %-98 %] 97 % (06/09 0528) Weight:  [87.9 kg (193 lb 12.6 oz)] 87.9 kg (193 lb 12.6 oz) (06/09 0528)  87.9 kg (193 lb 12.6 oz) Body mass index is 30.35 kg/(m^2).  Weight change: -1.6 kg (-3 lb 8.4 oz) Last BM Date: 02/16/12  Intake/Output from previous day: 06/08 0701 - 06/09 0700 In: 750 [P.O.:750] Out: 1000 [Urine:1000]  Total I&O since admission: -0.7 L  General- Well developed; no acute distress  Neck- No JVD, no carotid bruits Lungs- clear lung fields; normal I:E ratio Cardiovascular- normal PMI; normal S1 and S2; grade 1-2 systolic ejection murmur and fourth heart sound present Abdomen- normal bowel sounds; soft and non-tender without masses or organomegaly Skin- Warm, no significant lesions Extremities-  no edema  Lab Results: Cardiac Markers:   Basename 02/16/12 1825 02/16/12 0959  TROPONINI <0.30 <0.30   CBC:   Basename 02/17/12 0454  WBC 6.5  HGB 11.9*  HCT 35.9*  PLT 150   BMET:  Basename 02/17/12 0454 02/16/12 1329  NA 137 --  K 3.9 3.5  CL 99 --  CO2 28 --  GLUCOSE 207* --  BUN 16 --  CREATININE 0.98 --  CALCIUM 9.6 --     Basename 02/17/12 0454  CHOL 194  TRIG 76  HDL 47    Imaging Studies/Results: US Soft Tissue Head/neck  02/17/2012 .  IMPRESSION: Enlarged thyroid with multiple nodules and masses.  I suspect the left lobe is completely replaced by a single mass.  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   Recommend tissue sampling of the left thyroid lobe.  Original Report Authenticated By: Cyndie Chime, M.D.   Imaging: Imaging results have been reviewed  Medications:  I have reviewed the patient's current medications. Scheduled:  Principal Problem:  *Hypertensive emergency Active Problems:  DIABETES MELLITUS II, UNCOMPLICATED  HYPERCHOLESTEROLEMIA  HYPERTENSION, BENIGN SYSTEMIC  Weakness generalized  Multiple sclerosis  Abnormal EKG   Assessment/Plan: ASSESSMENT AND PLAN:  1) Abnormal EKG: Echocardiogram shows moderate LVH with normal systolic function.  EKG changes are consistent with those caused by LVH.  Patient was also markedly hypokalemic on admission, which may have contributed to EKG abnormalities.  Repeat tracing will be obtained 2) Elevated proBNP: Nondiagnostic; no clinical evidence for congestive heart failure.  May have been related to hypertension out of control with elevated diastolic left ventricular pressures. Level will be repeated. Consideration should also be given to pulmonary embolism. 3) HTN: Blood pressure control is improved, but still not optimal on a modest medical regime. Therapy will be advanced. 4) Syncope: AMS in the ER was related to medications.  5) Murmur: No significant valvular disease by echocardiography 6) Treatment with clopidogrel-indication uncertain. If there is no clear reason for use of this drug, it can be discontinued.   [place] Bing 02/19/2012, 8:33 AM

## 2012-02-19 NOTE — Progress Notes (Signed)
02-19-12  Nsg:  Family/pt continue not to let us administer Hydralazine prn for sbp > 160.  She states she has a history of it making her "feel bad - dizzy - weak" education reinforced but continues to refuse.  sbp this am is only 162

## 2012-02-19 NOTE — Progress Notes (Signed)
02-19-12 NSG:  Offered to clean up pt again while doing VS and CBG,  3 family members still at bedside and pt is still sitting in urine and still pt (who is [facility] & [facility]) and dtrs refusing to allow [place] to clean or turn pt.  State they will callus when they are ready to let us clean pt and turn her.  We will attempt again after the movie is finished at 2300 if pt has not called [place] by then.

## 2012-02-19 NOTE — Progress Notes (Signed)
VASCULAR LAB PRELIMINARY  PRELIMINARY  PRELIMINARY  PRELIMINARY  Bilateral lower extremity venous Dopplers completed.    Preliminary report:  There is no DVT or SVT noted in the bilateral lower extremity.  Sherren Kerns [place], 02/19/2012, 6:46 PM

## 2012-02-19 NOTE — Progress Notes (Signed)
[facility]                                                                                 Patient Demographics  Hannah Lamb, is a 71 y.o. female  ZOX:[id number]  [id number]  DOB - [birth year]  Admit date - 02/15/2012  Admitting Physician Cristal Ford, MD  Outpatient Primary MD for the patient is No primary provider on file.  LOS - 4    Chief Complaint  Patient presents with  . Shoulder Pain        Subjective:   Roderic Palau today has, No headache, No chest pain, No abdominal pain - No Nausea, No new weakness tingling or numbness, No Cough - SOB.   Objective:   Filed Vitals:   02/18/12 2109 02/18/12 2300 02/19/12 0528 02/19/12 1309  BP: 176/84 158/88 162/77 176/77  Pulse: 67 68 62 69  Temp: 98.2 F (36.8 C)  97.5 F (36.4 C) 98.9 F (37.2 C)  TempSrc: Oral  Oral Axillary  Resp: 20  18 19   Height:      Weight:   87.9 kg (193 lb 12.6 oz)   SpO2: 98%  97% 100%    Wt Readings from Last 3 Encounters:  02/19/12 87.9 kg (193 lb 12.6 oz)     Intake/Output Summary (Last 24 hours) at 02/19/12 1405 Last data filed at 02/19/12 1018  Gross per 24 hour  Intake    630 ml  Output   1000 ml  Net   -370 ml    Exam Awake Alert, Oriented *3, No new F.N deficits, Normal affect, generalized weakness lower extremities more than upper worse in the left lower leg strength 4 x 5 in lower extremity 5 x 5 in upper extremity. [place].AT,PERRAL Supple Neck,No JVD, No cervical lymphadenopathy appriciated.  Symmetrical Chest wall movement, Good air movement bilaterally, CTAB RRR,No Gallops,Rubs or new Murmurs, No Parasternal Heave +ve B.Sounds, Abd Soft, Non tender, No organomegaly appriciated, No rebound -guarding or rigidity. No Cyanosis, Clubbing or edema, No new Rash or bruise  Data Review  CBC  Lab 02/17/12 0454 02/16/12 0200 02/15/12 2145  WBC 6.5 8.5 6.3  HGB 11.9* 14.4 13.7  HCT 35.9* 42.0 40.1  PLT 150 174 185  MCV 85.1 83.7 84.4  MCH 28.2 28.7  28.8  MCHC 33.1 34.3 34.2  RDW 14.3 13.7 13.6  LYMPHSABS -- -- 2.1  MONOABS -- -- 0.3  EOSABS -- -- 0.1  BASOSABS -- -- 0.0  BANDABS -- -- --    Chemistries   Lab 02/17/12 1255 02/17/12 0454 02/16/12 1329 02/16/12 0200 02/15/12 2145  NA -- 137 -- 139 140  K -- 3.9 3.5 2.6* 3.3*  CL -- 99 -- 98 100  CO2 -- 28 -- 30 32  GLUCOSE -- 207* -- 215* 184*  BUN -- 16 -- 7 8  CREATININE -- 0.98 -- 0.73 0.61  CALCIUM -- 9.6 -- 9.0 9.5  MG 1.5 -- -- 1.5 --  AST -- -- -- -- 16  ALT -- -- -- -- 9  ALKPHOS -- -- -- -- 60  BILITOT -- -- -- -- 0.3   ------------------------------------------------------------------------------------------------------------------ estimated creatinine clearance is  59.9 ml/min (by C-G formula based on Cr of 0.98). ------------------------------------------------------------------------------------------------------------------ No results found for this basename: HGBA1C:2 in the last 72 hours ------------------------------------------------------------------------------------------------------------------  [facility] 02/17/12 0454  CHOL 194  HDL 47  LDLCALC 132*  TRIG 76  CHOLHDL 4.1  LDLDIRECT --   ------------------------------------------------------------------------------------------------------------------  Basename 02/17/12 1255  TSH 0.538  T4TOTAL --  T3FREE --  THYROIDAB --   ------------------------------------------------------------------------------------------------------------------ No results found for this basename: VITAMINB12:2,FOLATE:2,FERRITIN:2,TIBC:2,IRON:2,RETICCTPCT:2 in the last 72 hours  Coagulation profile No results found for this basename: INR:5,PROTIME:5 in the last 168 hours   Basename 02/19/12 1005  DDIMER 14.01*    Cardiac Enzymes  Lab 02/16/12 1825 02/16/12 0959 02/16/12 0200  CKMB 2.4 2.4 2.7  TROPONINI <0.30 <0.30 <0.30  MYOGLOBIN -- -- --    ------------------------------------------------------------------------------------------------------------------ No components found with this basename: POCBNP:3  Micro Results No results found for this or any previous visit (from the past 240 hour(s)).  Radiology Reports Dg Chest 2 View  02/15/2012  *RADIOLOGY REPORT*  Clinical Data: Weakness.  CHEST - 2 VIEW  Comparison: 04/10/2006.  Findings: The heart is mildly enlarged but stable.  There is tortuosity of the thoracic aorta.  The lungs are clear of acute process.  Mild chronic bronchitic changes.  No pleural effusion or pneumothorax.  The bony thorax is intact.  IMPRESSION: Mild cardiac enlargement, stable. Chronic bronchitic type changes but no acute pulmonary findings.  Original Report Authenticated By: P. Loralie Champagne, M.D.   Dg Shoulder Right  02/15/2012  *RADIOLOGY REPORT*  Clinical Data: Right shoulder pain.  RIGHT SHOULDER - 2+ VIEW  Comparison: None  Findings: The joint spaces are maintained.  Mild glenohumeral and AC joint degenerative changes.  No acute fracture or abnormal soft tissue calcifications.  The right lung apex is clear.  The right upper ribs are intact.  IMPRESSION: Mild degenerative changes but no fracture or dislocation.  Original Report Authenticated By: P. Loralie Champagne, M.D.   Ct Head Wo Contrast  02/16/2012  *RADIOLOGY REPORT*  Clinical Data: Weakness.  CT HEAD WITHOUT CONTRAST  Technique:  Contiguous axial images were obtained from the base of the skull through the vertex without contrast.  Comparison: Head CT 05/27/2007.  Brain MRI 05/27/2007.  Findings: The there are multiple well defined foci of low attenuation throughout the basal ganglia bilaterally, similar to the prior study from 05/27/2007, compatible with old lacunar infarctions.  Mild cerebral and cerebellar atrophy is noted.  Old cerebellar infarctions in the right cerebellar hemisphere are also noted.  There are extensive patchy and confluent areas of  decreased attenuation throughout the deep and periventricular white matter of the cerebral hemispheres bilaterally, consistent with chronic microvascular ischemic disease.  No definite acute intracranial abnormalities.  Specifically, no overt signs of acute/subacute cerebral ischemia, no acute intracranial hemorrhage, no focal mass, mass effect or hydrocephalous.  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  No acute displaced skull fractures are identified.  Visualized paranasal sinuses and mastoids are well pneumatized.  IMPRESSION: 1.  No acute intracranial abnormalities. 2.  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  In addition, there is extensive chronic microvascular ischemic disease of the white matter. 3.  Mild cerebral and cerebellar atrophy.  Original Report Authenticated By: Florencia Reasons, M.D.   Mr Laqueta Jean Wo Contrast  02/16/2012  *RADIOLOGY REPORT*  Clinical Data: Diabetic hypertensive with a history of multiple sclerosis presenting with weakness and difficulty ambulating.  MRI HEAD WITHOUT AND WITH CONTRAST  Technique:  Multiplanar, multiecho pulse sequences of the brain and surrounding structures were obtained according to standard protocol without and with intravenous contrast  Contrast: 17mL MULTIHANCE GADOBENATE DIMEGLUMINE 529 MG/ML IV SOLN  Comparison: 02/16/2012 CT. 05/27/2007 MR.  Findings: Areas of restricted motion within the right frontal lobe more consistent with acute infarcts than restricted motion related to active MS plaques.  This includes infarct involving the right caudate head and the anterior aspect of the right lenticular nucleus with smaller scattered infarcts throughout the right frontal lobe.  Remote small infarcts of the basal ganglia, thalami and cerebellum bilaterally.  Prominent white matter type changes fairly confluent in the periventricular region having progressed since prior  examination. This may represent combination of result of multiple sclerosis and / or small vessel disease.  Global atrophy.  Ventricular prominence felt to be related to atrophy rather hydrocephalus.  No intracranial mass.  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  Ectatic vertebral arteries and basilar artery. Basilar artery causes impression upon the undersurface of the hypothalamus. Ectatic patent carotid arteries.  Mild exophthalmos.  Minimal paranasal sinus mucosal thickening.  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  Remote small infarcts of the basal ganglia, thalami and cerebellum bilaterally.  Prominent white matter type changes fairly confluent in the periventricular region having progressed since prior examination. This may represent combination of result of multiple sclerosis and / or small vessel disease.  Global atrophy.  This has been made a PRA call report utilizing dashboard call feature.  Original Report Authenticated By: Fuller Canada, M.D.   Mr Cervical Spine W Wo Contrast  02/16/2012  *RADIOLOGY REPORT*  Clinical Data: Diabetic hypertensive with multiple sclerosis and difficulty walking.  MRI CERVICAL SPINE WITHOUT AND WITH CONTRAST  Technique:  Multiplanar and multiecho pulse sequences of the cervical spine, to include the craniocervical junction and cervicothoracic junction, were obtained according to standard protocol without and with intravenous contrast.  Contrast:  17 ml MultiHance.  Comparison: MR brain performed the same date.  No comparison cervical spine MR.  Findings: Markedly enlarged heterogeneous thyroid gland with dominant mass on the left displacing surrounding airway and vascular structures.  Thyroid ultrasound recommended for further delineation.  No focal cervical cord signal  abnormality or enhancement detected on this motion degraded exam.  C2-3:  Minimal bulge.  C3-4:  Mild bulge.  Minimal left uncinate bony overgrowth and foraminal narrowing.  C4-5:  Moderate broad-based protrusion.  Moderate spinal stenosis and mild cord flattening.  Uncinate bony overgrowth.  Moderate foraminal narrowing greater on the right.  C5-6:  Small to moderate broad-based right posterior lateral disc protrusion and osteophyte with mild compression of the right aspect of the cord.  Uncinate bony overgrowth.  Moderate to marked right- sided and mild left-sided foraminal narrowing.  C6-7:  Minimal bulge.  C7-T1:  Mild facet joint degenerative changes.  Minimal bulge.  T1-2:  Mild facet joint degenerative changes.  Minimal bulge.  T2-3:  Suggestion of posterior lateral tiny protrusion greater on the right.  Axial images not obtained.  IMPRESSION: No focal cord signal abnormality or abnormal enhancement noted on this motion degraded exam.  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  Markedly enlarged thyroid gland with mass greater on the left compressing surrounding air column and vascular structures. Thyroid ultrasound recommended for further  delineation.  This has been made a PRA call report utilizing dashboard call feature.  Original Report Authenticated By: Fuller Canada, M.D.   US Soft Tissue Head/neck  02/17/2012  *RADIOLOGY REPORT*  Clinical Data: Abnormal MRI.  Thyroid mass.  THYROID ULTRASOUND  Technique: Ultrasound examination of the thyroid gland and adjacent soft tissues was performed.  Comparison:  MRI of the cervical spine 02/16/2012  Findings:  Right thyroid lobe:  6.4 x 4.0 x 2.0 cm. Left thyroid lobe:  8.9 x 4.0 x 5.6 cm. Isthmus:  2.4 cm.  Focal nodules:  Multiple complex solid and cystic and solid heterogeneous nodules within the right thyroid lobe, including a 2.3 cm complex cystic and solid nodule in the upper pole, 2.6 cm solid nodule in the mid pole,  and 2.4 cm complex lesion in the lower pole.  Much of the left lobe is likely replaced by a large heterogeneous solid mass, difficult to measure.  Lymphadenopathy:  None visualized.  IMPRESSION: Enlarged thyroid with multiple nodules and masses.  I suspect the left lobe is completely replaced by a single mass.  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  Recommend tissue sampling of the left thyroid lobe.  Original Report Authenticated By: Cyndie Chime, M.D.    Carotid Duplex  No significant extracranial carotid artery stenosis demonstrated. Vertebrals are patent with antegrade flow.    Echo  Left ventricle: The cavity size was normal. Wall thickness was increased in a pattern of moderate LVH. Systolic function was vigorous. The estimated ejection fraction was in the range of 65% to 70%. Doppler parameters are consistent with abnormal left ventricular relaxation (grade 1 diastolic dysfunction).     Scheduled Meds:    . amLODipine  10 mg Oral Daily  . clopidogrel  75 mg Oral Q breakfast  . insulin aspart  0-8 Units Subcutaneous TID WC  . insulin aspart  3 Units Subcutaneous TID WC  . insulin aspart protamine-insulin aspart  22 Units Subcutaneous BID WC  . nebivolol  10 mg Oral Daily  . niacin  50 mg Oral BID WC  . potassium chloride  20 mEq Oral Daily  . sodium chloride  3 mL Intravenous Q12H  . DISCONTD: amLODipine  5 mg Oral Daily  . DISCONTD: atorvastatin  10 mg Oral q1800  . DISCONTD: insulin aspart  3 Units Subcutaneous Once  . DISCONTD: insulin aspart  4 Units Subcutaneous Once  . DISCONTD: insulin aspart protamine-insulin aspart  20 Units Subcutaneous BID WC   Continuous Infusions:  PRN Meds:.sodium chloride, acetaminophen, sodium chloride, DISCONTD: hydrALAZINE  Assessment & Plan   1. 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 Continue risk factor modification. Neurology following, echo and carotid duplex stable.    2. History of MS no signs of acute flare outpatient monitoring.    3. Hypertension stable continue present medications blood pressure was high upon admission secondary to CVA. At to titrate blood pressure medications but family persistently refusing new medications or change in dosages of present medicines.    4. Diabetes mellitus type 2 sugars are very labile patient despite much than lower home dose NPH had episodes of hypoglycemia on day one, now sugars are high, we will gently increase NPH to twice a day dose but still lower than home dose, continue sliding scale insulin  with meals, continue close monitoring of CBGs.   Lab Results  Component Value Date   HGBA1C 10.5* 02/16/2012    CBG (last 3)   Basename 02/19/12 1152 02/19/12 0729 02/18/12 2203  GLUCAP 147* 205* 133*     5. Dyslipidemia patient  Refused statin and niacin, defer to  primary care physician for further management.     6. History of thyroid mass appreciated ENT input, TSH is stable, patient scheduled for ultrasound-guided biopsy then outpatient ENT followup.     7. Abnormal EKG. Patient is pain-free, ruled out for MI, on Plavix, statin, beta blocker, echo stable, appreciate cardiology input. No further inpatient testing suggested by cardiology.    8. Elevated D-dimer ordered by cardiologist Dr. Dietrich Pates - she'll of PE remains low however cardiology has suggested to do CT angiogram to definitely rule out PE along with leg [place], gentle IVF for renal protection, updated family.    I have personally spent Minimum of 20-25 minutes each day in the room talking to patient and family in detail, despite all clarifications and warning patient and family continued to refuse medications on a regular basis, and they will be responsible for any adverse consequences  including stroke, death, disability.    DVT Prophylaxis   SCDs    Procedures CT brain, MRI brain and neck, echogram, carotid duplex, ultrasound-guided thyroid biopsy pending.    Consults neurology, ENT, PT OT speech   Leroy Sea M.D on 02/19/2012 at 2:05 PM  Between 7am to 7pm - Pager - [phone]  After 7pm go to [email or web address] - password [email or web address]  And look for the night coverage person covering for me after hours  [facility] Office  [phone]

## 2012-02-19 NOTE — Progress Notes (Signed)
02-19-12  Nsg:  In to turn pt and noted pt has had an incontinent episode (which is wnl for this pt)  Pt refusing to let me clean or turn her at this time.  Verbalizes understanding that she is sitting in urine and reinforcement of skin protection given and both dtrs and pt verbalize understanding of this information but continue to refuse to allow [place] to clean her.  States she is watching a movie at this time and will let me know when she will allow me to clean her.  Would not allow me to turn her either but would allow me to float her heels off of the surface of the bed.  Will continue to monitor and reinforce cleaning/turning etc.

## 2012-02-19 NOTE — Progress Notes (Signed)
02-18-12  NSG:  Pt is incont of urine - not a new finding - and urine has a strong, foul odor and is light in color, have instructed pt /family to inform us if she is wet, we will continue to turn her q 2 hours and check for incont,  Pt cleansed and barrier cream applied

## 2012-02-20 ENCOUNTER — Other Ambulatory Visit (HOSPITAL_COMMUNITY): Payer: Medicare Other

## 2012-02-20 ENCOUNTER — Encounter (HOSPITAL_COMMUNITY): Payer: Self-pay | Admitting: *Deleted

## 2012-02-20 ENCOUNTER — Inpatient Hospital Stay (HOSPITAL_COMMUNITY): Payer: Medicare Other

## 2012-02-20 ENCOUNTER — Inpatient Hospital Stay (HOSPITAL_COMMUNITY)
Admission: AD | Admit: 2012-02-20 | Discharge: 2012-03-05 | DRG: 945 | Disposition: A | Discharge status: Home Health | Payer: Medicare Other | Source: Ambulatory Visit | Attending: Physical Medicine & Rehabilitation | Admitting: Physical Medicine & Rehabilitation

## 2012-02-20 DIAGNOSIS — I6350 Cerebral infarction due to unspecified occlusion or stenosis of unspecified cerebral artery: Secondary | ICD-10-CM | POA: Diagnosis present

## 2012-02-20 DIAGNOSIS — G8110 Spastic hemiplegia affecting unspecified side: Secondary | ICD-10-CM

## 2012-02-20 DIAGNOSIS — Z5189 Encounter for other specified aftercare: Secondary | ICD-10-CM

## 2012-02-20 DIAGNOSIS — E785 Hyperlipidemia, unspecified: Secondary | ICD-10-CM | POA: Diagnosis present

## 2012-02-20 DIAGNOSIS — Z8673 Personal history of transient ischemic attack (TIA), and cerebral infarction without residual deficits: Secondary | ICD-10-CM

## 2012-02-20 DIAGNOSIS — I10 Essential (primary) hypertension: Secondary | ICD-10-CM | POA: Diagnosis present

## 2012-02-20 DIAGNOSIS — G35 Multiple sclerosis: Secondary | ICD-10-CM | POA: Diagnosis present

## 2012-02-20 DIAGNOSIS — R5383 Other fatigue: Secondary | ICD-10-CM | POA: Diagnosis present

## 2012-02-20 DIAGNOSIS — I6330 Cerebral infarction due to thrombosis of unspecified cerebral artery: Secondary | ICD-10-CM

## 2012-02-20 DIAGNOSIS — R5381 Other malaise: Secondary | ICD-10-CM

## 2012-02-20 DIAGNOSIS — K5900 Constipation, unspecified: Secondary | ICD-10-CM | POA: Diagnosis present

## 2012-02-20 DIAGNOSIS — IMO0001 Reserved for inherently not codable concepts without codable children: Secondary | ICD-10-CM | POA: Diagnosis present

## 2012-02-20 DIAGNOSIS — E876 Hypokalemia: Secondary | ICD-10-CM | POA: Diagnosis present

## 2012-02-20 DIAGNOSIS — M67919 Unspecified disorder of synovium and tendon, unspecified shoulder: Secondary | ICD-10-CM | POA: Diagnosis present

## 2012-02-20 DIAGNOSIS — N390 Urinary tract infection, site not specified: Secondary | ICD-10-CM | POA: Diagnosis not present

## 2012-02-20 DIAGNOSIS — E079 Disorder of thyroid, unspecified: Secondary | ICD-10-CM | POA: Diagnosis present

## 2012-02-20 DIAGNOSIS — E1165 Type 2 diabetes mellitus with hyperglycemia: Secondary | ICD-10-CM

## 2012-02-20 DIAGNOSIS — M719 Bursopathy, unspecified: Secondary | ICD-10-CM | POA: Diagnosis present

## 2012-02-20 LAB — GLUCOSE, CAPILLARY
Glucose-Capillary: 98 mg/dL (ref 70–99)
Glucose-Capillary: 72 mg/dL (ref 70–99)
Glucose-Capillary: 76 mg/dL (ref 70–99)
Glucose-Capillary: 138 mg/dL — ABNORMAL HIGH (ref 70–99)
Glucose-Capillary: 182 mg/dL — ABNORMAL HIGH (ref 70–99)
Glucose-Capillary: 232 mg/dL — ABNORMAL HIGH (ref 70–99)

## 2012-02-20 LAB — MAGNESIUM: Magnesium: 1.8 mg/dL (ref 1.5–2.5)

## 2012-02-20 LAB — POTASSIUM: Potassium: 3.6 mEq/L (ref 3.5–5.1)

## 2012-02-20 MED ORDER — INSULIN ASPART PROT & ASPART (70-30 MIX) 100 UNIT/ML ~~LOC~~ SUSP: 20.0000 [IU] | Freq: Every day | SUBCUTANEOUS | Status: DC

## 2012-02-20 MED ORDER — INSULIN ASPART PROT & ASPART (70-30 MIX) 100 UNIT/ML ~~LOC~~ SUSP
22.0000 [IU] | Freq: Every day | SUBCUTANEOUS | Status: DC
  Filled 2012-02-20: qty 3

## 2012-02-20 MED ORDER — INSULIN ASPART PROT & ASPART (70-30 MIX) 100 UNIT/ML ~~LOC~~ SUSP
20.0000 [IU] | Freq: Every day | SUBCUTANEOUS | Status: DC
  Administered 2012-02-20: 20 [IU] via SUBCUTANEOUS
  Filled 2012-02-20: qty 3

## 2012-02-20 MED ORDER — POLYETHYLENE GLYCOL 3350 17 G PO PACK
17.0000 g | PACK | Freq: Every day | ORAL | Status: DC | PRN
  Administered 2012-03-02: 17 g via ORAL
  Filled 2012-02-20: qty 1

## 2012-02-20 MED ORDER — CLOPIDOGREL BISULFATE 75 MG PO TABS: 75.0000 mg | ORAL_TABLET | Freq: Every day | ORAL | Status: DC

## 2012-02-20 MED ORDER — NEBIVOLOL HCL 10 MG PO TABS
10.0000 mg | ORAL_TABLET | Freq: Every day | ORAL | Status: DC
  Administered 2012-02-20 – 2012-03-05 (×15): 10 mg via ORAL
  Filled 2012-02-20 (×17): qty 1

## 2012-02-20 MED ORDER — CLONIDINE HCL 0.1 MG PO TABS: 0.1000 mg | ORAL_TABLET | Freq: Four times a day (QID) | ORAL | Status: DC | PRN

## 2012-02-20 MED ORDER — ACETAMINOPHEN 325 MG PO TABS
325.0000 mg | ORAL_TABLET | ORAL | Status: DC | PRN
  Filled 2012-02-20: qty 2

## 2012-02-20 MED ORDER — INSULIN ASPART 100 UNIT/ML ~~LOC~~ SOLN: 0.0000 [IU] | Freq: Three times a day (TID) | SUBCUTANEOUS | Status: DC

## 2012-02-20 MED ORDER — INSULIN ASPART 100 UNIT/ML ~~LOC~~ SOLN: 2.0000 [IU] | Freq: Three times a day (TID) | SUBCUTANEOUS | Status: DC

## 2012-02-20 MED ORDER — INSULIN ASPART PROT & ASPART (70-30 MIX) 100 UNIT/ML ~~LOC~~ SUSP: SUBCUTANEOUS | Status: DC

## 2012-02-20 MED ORDER — CLOPIDOGREL BISULFATE 75 MG PO TABS
75.0000 mg | ORAL_TABLET | Freq: Every day | ORAL | Status: DC
  Administered 2012-02-21 – 2012-02-27 (×7): 75 mg via ORAL
  Filled 2012-02-20 (×9): qty 1

## 2012-02-20 MED ORDER — INSULIN ASPART PROT & ASPART (70-30 MIX) 100 UNIT/ML ~~LOC~~ SUSP
22.0000 [IU] | Freq: Every day | SUBCUTANEOUS | Status: DC
  Administered 2012-02-21 – 2012-02-22 (×2): 22 [IU] via SUBCUTANEOUS
  Filled 2012-02-20: qty 3

## 2012-02-20 MED ORDER — ONDANSETRON HCL 4 MG/2ML IJ SOLN: 4.0000 mg | Freq: Four times a day (QID) | INTRAMUSCULAR | Status: DC | PRN

## 2012-02-20 MED ORDER — ONDANSETRON HCL 4 MG PO TABS: 4.0000 mg | ORAL_TABLET | Freq: Four times a day (QID) | ORAL | Status: DC | PRN

## 2012-02-20 MED ORDER — SORBITOL 70 % SOLN: 30.0000 mL | Freq: Every day | Status: DC | PRN

## 2012-02-20 MED ORDER — POTASSIUM CHLORIDE CRYS ER 20 MEQ PO TBCR
20.0000 meq | EXTENDED_RELEASE_TABLET | Freq: Every day | ORAL | Status: DC
  Administered 2012-02-20 – 2012-03-05 (×15): 20 meq via ORAL
  Filled 2012-02-20 (×16): qty 1

## 2012-02-20 MED ORDER — CLONIDINE HCL 0.1 MG PO TABS
0.1000 mg | ORAL_TABLET | Freq: Four times a day (QID) | ORAL | Status: DC | PRN
  Filled 2012-02-20: qty 1

## 2012-02-20 MED ORDER — INSULIN ASPART 100 UNIT/ML ~~LOC~~ SOLN
0.0000 [IU] | Freq: Three times a day (TID) | SUBCUTANEOUS | Status: DC
  Administered 2012-02-22: 1 [IU] via SUBCUTANEOUS
  Administered 2012-02-22: 2 [IU] via SUBCUTANEOUS
  Administered 2012-02-23: 1 [IU] via SUBCUTANEOUS
  Administered 2012-02-25: 6 [IU] via SUBCUTANEOUS
  Administered 2012-02-26: 2 [IU] via SUBCUTANEOUS
  Administered 2012-02-26: 4 [IU] via SUBCUTANEOUS
  Administered 2012-02-29: 1 [IU] via SUBCUTANEOUS
  Administered 2012-03-01 – 2012-03-02 (×3): 2 [IU] via SUBCUTANEOUS
  Administered 2012-03-02 – 2012-03-03 (×2): 1 [IU] via SUBCUTANEOUS
  Administered 2012-03-05: 2 [IU] via SUBCUTANEOUS

## 2012-02-20 NOTE — Discharge Summary (Addendum)
[facility]                                                                                   Hannah Lamb, is a 71 y.o. female  DOB 1941-04-29  MRN [id number].  Admission date:  02/15/2012  Discharge Date:  02/20/2012  Primary MD  No primary provider on file.  Admitting Physician  Cristal Ford, MD  Admission Diagnosis  Pedal edema [782.3] Uncontrolled hypertension [401.9] Generalized weakness [780.79] SHOULDER PAIN  Discharge Diagnosis     Principal Problem:  *Hypertensive emergency Active Problems:  DIABETES MELLITUS II, UNCOMPLICATED  HYPERCHOLESTEROLEMIA  HYPERTENSION, BENIGN SYSTEMIC  Weakness generalized  Multiple sclerosis  Abnormal EKG    Past Medical History  Diagnosis Date  . Hypertension   . MS (multiple sclerosis)   . Diabetes mellitus   . Goiter   . Hyperlipidemia     Past Surgical History  Procedure Date  . Abdominal hysterectomy      Hospital Course See H&P, Labs, Consult and Test reports for all details in brief, patient was admitted for  acute on chronic weakness, patient carries a diagnosis of multiple sclerosis which was diagnosed at [facility] 3 years ago, patient apparently not on any medications per [facility]. He should started experiencing some weakness about 24 hours prior to her hospital visit, when she came to the hospital in the ER she had a head CT which was unremarkable, her blood pressure was noted to be extremely elevated and she was diagnosed with hypertensive urgency in the ER, in the ER she received some antihypertensive medications due to which her blood pressure dropped and she syncopized. She was stabilized she was hospitalized to the hospital I took over her care the next day.  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 Patient was seen by neurology, she was on 81 mg of aspirin at home which was switched to Plavix, patient is diabetic and has dyslipidemia, however family did not want her to get Lipitor or niacin, at this point I defer her long-term risk factor modulation to her primary care physician. She has responded well to PT OT and speech, she had never had any swallowing issues, she will continue to get PT OT at inpatient rehabilitation and will benefit from it.   Note we were not able to find any evidence of MS on her pain or C-spine MRI, she will follow with [facility] on her local neurologist as desired in 1-2 weeks for stroke and history of MS.   Patient also has history of thyroid mass, ultrasound was done, which confirmed it, she was seen by ENT physician who wants to see her in the office in a month and do a elective resection of note fine needle aspiration of thyroid was not done due to patient being  on Plavix, if this is desired Plavix needs to BE held for 7 days and this has to be done as outpatient . In the best scenario will be the patient should follow with ENT in 2 weeks time and then decide whether this test is needed and at what time.   Patient is diabetic with labile sugars and was in poor control as evident by high hemoglobin A1c, her insulin regimen has been adjusted on which she is currently quite stable. I recommend that q. a.c. at bedtime Accu-Cheks be done. Any insulin should be held if her sugars are under 120 or if patient is unable to finish more than 50% of her meal.   Lab Results  Component Value Date   HGBA1C 10.5* 02/16/2012    CBG (last 3)   Basename 02/20/12 0758 02/19/12 2205 02/19/12 1718  GLUCAP 98 78 238*     Hypertension in poor control- he is on systolic family has refused hydralazine, have  refused Norvasc. We'll place her on when necessary Catapres if acceptable to patient of family.   Abnormal EKG- on admission likely due to severe hyperkalemia, patient did not have any chest pain, she remained symptom free, she ruled out for MI, echo was stable. Discussed her case in detail with Dr. Eden Emms cardiologist who recommends no further testing. Note cardiology had requested d-dimer yesterday which was elevated, this is nonspecific, patient had negative venous duplex of her legs, discussed again with cardiology today at bedside and family, patient has no chest pain no shortness of breath she is comfortable she clinically does not have PE. Furthermore family refused CT chest. Although I agree with them chances of PE are very minimal to none.     Chr Diastolic CHF grade 1 - compensated.     Consults neurology, cardiology, ENT, PT, speech, OT  Significant Tests:  See full reports for all details      US Soft Tissue Head/neck    02/17/2012 *RADIOLOGY REPORT* Clinical Data: Abnormal MRI. Thyroid mass. THYROID ULTRASOUND Technique: Ultrasound examination of the thyroid gland and adjacent soft tissues was performed. Comparison: MRI of the cervical spine 02/16/2012 Findings: Right thyroid lobe: 6.4 x 4.0 x 2.0 cm. Left thyroid lobe: 8.9 x 4.0 x 5.6 cm. Isthmus: 2.4 cm. Focal nodules: Multiple complex solid and cystic and solid heterogeneous nodules within the right thyroid lobe, including a 2.3 cm complex cystic and solid nodule in the upper pole, 2.6 cm solid nodule in the mid pole, and 2.4 cm complex lesion in the lower pole. Much of the left lobe is likely replaced by a large heterogeneous solid mass, difficult to measure. Lymphadenopathy: None visualized. IMPRESSION: Enlarged thyroid with multiple nodules and masses. I suspect the left lobe is completely replaced by a single mass. 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 Recommend tissue sampling of the left thyroid lobe. Original Report  Authenticated By: Cyndie Chime, M.D.     Carotid Duplex  No significant extracranial carotid artery stenosis demonstrated. Vertebrals are patent with antegrade flow.    Echo  Left ventricle: The cavity size was normal. Wall thickness was increased in a pattern of moderate LVH. Systolic function was vigorous. The estimated ejection fraction was in the range of 65% to 70%. Doppler parameters are consistent with abnormal left ventricular relaxation (grade 1 diastolic dysfunction).     Dg Chest 2 View  02/15/2012  *RADIOLOGY REPORT*  Clinical Data: Weakness.  CHEST - 2 VIEW  Comparison: 04/10/2006.  Findings: The heart is mildly enlarged  but stable.  There is tortuosity of the thoracic aorta.  The lungs are clear of acute process.  Mild chronic bronchitic changes.  No pleural effusion or pneumothorax.  The bony thorax is intact.  IMPRESSION: Mild cardiac enlargement, stable. Chronic bronchitic type changes but no acute pulmonary findings.  Original Report Authenticated By: P. Loralie Champagne, M.D.   Dg Shoulder Right  02/15/2012  *RADIOLOGY REPORT*  Clinical Data: Right shoulder pain.  RIGHT SHOULDER - 2+ VIEW  Comparison: None  Findings: The joint spaces are maintained.  Mild glenohumeral and AC joint degenerative changes.  No acute fracture or abnormal soft tissue calcifications.  The right lung apex is clear.  The right upper ribs are intact.  IMPRESSION: Mild degenerative changes but no fracture or dislocation.  Original Report Authenticated By: P. Loralie Champagne, M.D.   Ct Head Wo Contrast  02/16/2012  *RADIOLOGY REPORT*  Clinical Data: Weakness.  CT HEAD WITHOUT CONTRAST  Technique:  Contiguous axial images were obtained from the base of the skull through the vertex without contrast.  Comparison: Head CT 05/27/2007.  Brain MRI 05/27/2007.  Findings: The there are multiple well defined foci of low attenuation throughout the basal ganglia bilaterally, similar to the prior study from 05/27/2007,  compatible with old lacunar infarctions.  Mild cerebral and cerebellar atrophy is noted.  Old cerebellar infarctions in the right cerebellar hemisphere are also noted.  There are extensive patchy and confluent areas of decreased attenuation throughout the deep and periventricular white matter of the cerebral hemispheres bilaterally, consistent with chronic microvascular ischemic disease.  No definite acute intracranial abnormalities.  Specifically, no overt signs of acute/subacute cerebral ischemia, no acute intracranial hemorrhage, no focal mass, mass effect or hydrocephalous.  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  No acute displaced skull fractures are identified.  Visualized paranasal sinuses and mastoids are well pneumatized.  IMPRESSION: 1.  No acute intracranial abnormalities. 2.  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  In addition, there is extensive chronic microvascular ischemic disease of the white matter. 3.  Mild cerebral and cerebellar atrophy.  Original Report Authenticated By: Florencia Reasons, M.D.   Mr Laqueta Jean Wo Contrast  02/16/2012  *RADIOLOGY REPORT*  Clinical Data: Diabetic hypertensive with a history of multiple sclerosis presenting with weakness and difficulty ambulating.  MRI HEAD WITHOUT AND WITH CONTRAST  Technique:  Multiplanar, multiecho pulse sequences of the brain and surrounding structures were obtained according to standard protocol without and with intravenous contrast  Contrast: 17mL MULTIHANCE GADOBENATE DIMEGLUMINE 529 MG/ML IV SOLN  Comparison: 02/16/2012 CT. 05/27/2007 MR.  Findings: Areas of restricted motion within the right frontal lobe more consistent with acute infarcts than restricted motion related to active MS plaques.  This includes infarct involving the right caudate head and the anterior aspect of the right lenticular nucleus with smaller scattered infarcts throughout the  right frontal lobe.  Remote small infarcts of the basal ganglia, thalami and cerebellum bilaterally.  Prominent white matter type changes fairly confluent in the periventricular region having progressed since prior examination. This may represent combination of result of multiple sclerosis and / or small vessel disease.  Global atrophy.  Ventricular prominence felt to be related to atrophy rather hydrocephalus.  No intracranial mass.  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  Ectatic vertebral arteries and basilar artery. Basilar artery causes impression upon the undersurface of the hypothalamus. Ectatic patent carotid  arteries.  Mild exophthalmos.  Minimal paranasal sinus mucosal thickening.  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  Remote small infarcts of the basal ganglia, thalami and cerebellum bilaterally.  Prominent white matter type changes fairly confluent in the periventricular region having progressed since prior examination. This may represent combination of result of multiple sclerosis and / or small vessel disease.  Global atrophy.  This has been made a PRA call report utilizing dashboard call feature.  Original Report Authenticated By: Fuller Canada, M.D.   Mr Cervical Spine W Wo Contrast  02/16/2012  *RADIOLOGY REPORT*  Clinical Data: Diabetic hypertensive with multiple sclerosis and difficulty walking.  MRI CERVICAL SPINE WITHOUT AND WITH CONTRAST  Technique:  Multiplanar and multiecho pulse sequences of the cervical spine, to include the craniocervical junction and cervicothoracic junction, were obtained according to standard protocol without and with intravenous contrast.  Contrast:  17 ml MultiHance.  Comparison: MR brain performed the same date.  No comparison cervical spine MR.  Findings:  Markedly enlarged heterogeneous thyroid gland with dominant mass on the left displacing surrounding airway and vascular structures.  Thyroid ultrasound recommended for further delineation.  No focal cervical cord signal abnormality or enhancement detected on this motion degraded exam.  C2-3:  Minimal bulge.  C3-4:  Mild bulge.  Minimal left uncinate bony overgrowth and foraminal narrowing.  C4-5:  Moderate broad-based protrusion.  Moderate spinal stenosis and mild cord flattening.  Uncinate bony overgrowth.  Moderate foraminal narrowing greater on the right.  C5-6:  Small to moderate broad-based right posterior lateral disc protrusion and osteophyte with mild compression of the right aspect of the cord.  Uncinate bony overgrowth.  Moderate to marked right- sided and mild left-sided foraminal narrowing.  C6-7:  Minimal bulge.  C7-T1:  Mild facet joint degenerative changes.  Minimal bulge.  T1-2:  Mild facet joint degenerative changes.  Minimal bulge.  T2-3:  Suggestion of posterior lateral tiny protrusion greater on the right.  Axial images not obtained.  IMPRESSION: No focal cord signal abnormality or abnormal enhancement noted on this motion degraded exam.  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  Markedly enlarged thyroid gland with mass greater on the left compressing surrounding air column and vascular structures. Thyroid ultrasound recommended for further delineation.  This has been made a PRA call report utilizing dashboard call feature.  Original Report Authenticated By: Fuller Canada, M.D.   US Soft Tissue Head/neck  02/17/2012  *RADIOLOGY REPORT*  Clinical Data: Abnormal MRI.  Thyroid mass.  THYROID ULTRASOUND  Technique: Ultrasound examination of the thyroid gland and adjacent soft tissues was performed.  Comparison:  MRI of the cervical spine 02/16/2012  Findings:  Right thyroid lobe:  6.4 x 4.0 x 2.0 cm. Left thyroid lobe:  8.9 x 4.0 x 5.6 cm. Isthmus:  2.4 cm.   Focal nodules:  Multiple complex solid and cystic and solid heterogeneous nodules within the right thyroid lobe, including a 2.3 cm complex cystic and solid nodule in the upper pole, 2.6 cm solid nodule in the mid pole, and 2.4 cm complex lesion in the lower pole.  Much of the left lobe is likely replaced by a large heterogeneous solid mass, difficult to measure.  Lymphadenopathy:  None visualized.  IMPRESSION: Enlarged thyroid with multiple nodules and masses.  I suspect the left lobe is completely replaced by a single mass.  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  Recommend tissue sampling of the left thyroid lobe.  Original Report Authenticated By: Cyndie Chime, M.D.     Today   Subjective:   Ekta Dancer today has no headache,no chest abdominal pain,no new weakness tingling or numbness, feels much better wants to go home today.    Objective:   Blood pressure 183/82, pulse 64, temperature 98.7 F (37.1 C), temperature source Oral, resp. rate 18, height 5\' 7"  (1.702 m), weight 87.9 kg (193 lb 12.6 oz), SpO2 98.00%.  Intake/Output Summary (Last 24 hours) at 02/20/12 1129 Last data filed at 02/20/12 0900  Gross per 24 hour  Intake    600 ml  Output      0 ml  Net    600 ml    Exam Awake Alert, Oriented *3, No new F.N deficits, Normal affect Meta.AT,PERRAL Supple Neck,No JVD, No cervical lymphadenopathy appriciated.  Symmetrical Chest wall movement, Good air movement bilaterally, CTAB RRR,No Gallops,Rubs or new Murmurs, No Parasternal Heave +ve B.Sounds, Abd Soft, Non tender, No organomegaly appriciated, No rebound -guarding or rigidity. No Cyanosis, Clubbing or edema, No new Rash or bruise  Data Review     CBC w Diff: Lab Results  Component Value Date   WBC 6.5 02/17/2012   HGB 11.9* 02/17/2012   HCT 35.9* 02/17/2012   PLT 150 02/17/2012   LYMPHOPCT 33 02/15/2012   MONOPCT 5 02/15/2012   EOSPCT 1 02/15/2012   BASOPCT 0 02/15/2012    CMP: Lab Results  Component Value Date   NA 137  02/17/2012   K 3.6 02/20/2012   CL 99 02/17/2012   CO2 28 02/17/2012   BUN 16 02/17/2012   CREATININE 0.98 02/17/2012   PROT 7.5 02/15/2012   ALBUMIN 3.5 02/15/2012   BILITOT 0.3 02/15/2012   ALKPHOS 60 02/15/2012   AST 16 02/15/2012   ALT 9 02/15/2012  .   Discharge Instructions     Follow with Primary MD in 7 days   Get CBC, BMP, Magnesium checked 7 days by Primary MD and again as instructed by your Primary MD.   Get Medicines reviewed and adjusted.  Please request your Prim.MD to go over all Hospital Tests and Procedure/Radiological results at the follow up, please get all Hospital records sent to your Prim MD by signing hospital release before you go home.  Activity: As tolerated with Full fall precautions use walker/cane & assistance as needed  Diet: Heart healthy low carb diet with aspiration precautions.  For Heart failure patients - Check your Weight same time everyday, if you gain over 2 pounds, or you develop in leg swelling, experience more shortness of breath or chest pain, call your Primary MD immediately. Follow Cardiac Low Salt Diet and 1.8 lit/day fluid restriction.  Disposition Rehab  If you experience worsening of your admission symptoms, develop shortness of breath, life threatening emergency, suicidal or homicidal thoughts you must seek medical attention immediately by calling 911 or calling your MD immediately  if symptoms less severe.  You Must read complete instructions/literature along with all the possible adverse reactions/side effects for all the Medicines you take and that have been prescribed to you. Take any new Medicines after you have completely understood and accpet all the possible adverse reactions/side effects.     Follow-up Information    Follow up with Your primary care physician. Schedule an appointment as soon as possible for a visit in 1 week.      Follow up with [facility]  P, MD. Schedule an appointment as soon as possible for a visit in 2 weeks.    Contact information:   [address] [facility] [place] [address] [phone]       Follow up with Verne Carrow, MD. Schedule an appointment as soon as possible for a visit in 2 weeks.   Contact information:   [facility] [address] [place] [address] [phone]       Follow up with Darletta Moll, MD. Schedule an appointment as soon as possible for a visit in 2 weeks.   Contact information:   [address] [place] [address] [phone]           Ariel, Dimitri  Home Medication Instructions [id number]   Printed on:02/20/12 1249  Medication Information                    nebivolol (BYSTOLIC) 10 MG tablet Take 10 mg by mouth daily.           potassium chloride (MICRO-K) 10 MEQ CR capsule Take 10 mEq by mouth 2 (two) times daily.           insulin aspart protamine-insulin aspart (NOVOLOG 70/30) (70-30) 100 UNIT/ML injection Subcutaneous 22 units with breakfast and 20 units with supper. Hold if patient does not eat more than 50% of her meal or her glucose is less than 130.           clopidogrel (PLAVIX) 75 MG tablet Take 1 tablet (75 mg total) by mouth daily with breakfast.           insulin aspart (NOVOLOG) 100 UNIT/ML injection Inject 0-10 Units into the skin 3 (three) times daily with meals. Before each meal 3 times a day, 140-199 - 2 units, 200-250 - 4 units, 251-299 - 6 units,  300-349 - 8 units,  350 or above 10 units.           cloNIDine (CATAPRES) 0.1 MG tablet Take 1 tablet (0.1 mg total) by mouth every 6 (six) hours as needed (SBP>150).             Total Time in preparing paper work, data evaluation and todays exam - 35 minutes  Leroy Sea M.D on 02/20/2012 at 11:29 AM  [facility] Office  [phone]

## 2012-02-20 NOTE — Discharge Instructions (Signed)
Follow with Primary MD in 7 days   Get CBC, BMP, Magnesium checked 7 days by Primary MD and again as instructed by your Primary MD.   Get Medicines reviewed and adjusted.  Please request your Prim.MD to go over all Hospital Tests and Procedure/Radiological results at the follow up, please get all Hospital records sent to your Prim MD by signing hospital release before you go home.  Activity: As tolerated with Full fall precautions use walker/cane & assistance as needed  Diet: Heart healthy low carb diet with aspiration precautions.  For Heart failure patients - Check your Weight same time everyday, if you gain over 2 pounds, or you develop in leg swelling, experience more shortness of breath or chest pain, call your Primary MD immediately. Follow Cardiac Low Salt Diet and 1.8 lit/day fluid restriction.  Disposition Rehab  If you experience worsening of your admission symptoms, develop shortness of breath, life threatening emergency, suicidal or homicidal thoughts you must seek medical attention immediately by calling 911 or calling your MD immediately  if symptoms less severe.  You Must read complete instructions/literature along with all the possible adverse reactions/side effects for all the Medicines you take and that have been prescribed to you. Take any new Medicines after you have completely understood and accpet all the possible adverse reactions/side effects.

## 2012-02-20 NOTE — Progress Notes (Signed)
Physical Therapy Treatment Patient Details Name: Hannah Lamb MRN: [id number] DOB: 1941-03-09 Today's Date: 02/20/2012 Time: 4098-1191 PT Time Calculation (min): 35 min  PT Assessment / Plan / Recommendation Comments on Treatment Session  Pt making slow progress and demos increased sitting balance from previous session.  Pt to D/C to CIR today.     Follow Up Recommendations  Inpatient Rehab    Barriers to Discharge        Equipment Recommendations  Defer to next venue    Recommendations for Other Services    Frequency Min 3X/week   Plan Discharge plan remains appropriate    Precautions / Restrictions Precautions Precautions: Fall Precaution Comments: R shoulder deficits PTA. Family present and requested limited pulling up on R UE when mobilizing pt Restrictions Weight Bearing Restrictions: No   Pertinent Vitals/Pain No pain    Mobility  Bed Mobility Bed Mobility: Supine to Sit Supine to Sit: 1: +2 Total assist;HOB flat Supine to Sit: Patient Percentage: 50% Details for Bed Mobility Assistance: Assist for B LEs off of bed and trunk to attain sitting.  continue to provide cues for hand placement to self assist trunk.   Transfers Transfers: Sit to Stand;Stand to Sit;Squat Pivot Transfers Sit to Stand: 1: +2 Total assist;From bed;From elevated surface Sit to Stand: Patient Percentage: 50% Stand to Sit: 1: +2 Total assist;To chair/3-in-1;With upper extremity assist Stand to Sit: Patient Percentage: 50% Squat Pivot Transfers: 1: +2 Total assist;With armrests;With upper extremity assistance Squat Pivot Transfers: Patient Percentage: 40% Details for Transfer Assistance: Provided verbal cues for safety, technique and hand placement.  Performed squat pivot from bed to 3in1 due to pt needing to use restroom.  then performed sit to/from stand with cues mentioned before.  Ambulation/Gait Ambulation/Gait Assistance: 1: +2 Total assist Ambulation/Gait: Patient Percentage:  60% Ambulation Distance (Feet): 10 Feet Assistive device: Rolling walker Ambulation/Gait Assistance Details: Multi modal cues for safety, technique, posture and sequencing.  Assist to stabalize in upright position.  Pt with improved upright position, however states she feels like she is going to fall.  continues to need assist to weight shift and advance LLE.   Gait Pattern: Step-to pattern;Lateral trunk lean to left;Ataxic;Decreased stride length    Exercises     PT Diagnosis:    PT Problem List:   PT Treatment Interventions:     PT Goals Acute Rehab PT Goals PT Goal Formulation: With patient/family Time For Goal Achievement: 03/01/12 Potential to Achieve Goals: Good Pt will go Supine/Side to Sit: with min assist PT Goal: Supine/Side to Sit - Progress: Progressing toward goal Pt will go Sit to Stand: with min assist PT Goal: Sit to Stand - Progress: Progressing toward goal Pt will Transfer Bed to Chair/Chair to Bed: with min assist PT Transfer Goal: Bed to Chair/Chair to Bed - Progress: Progressing toward goal Pt will Ambulate: 51 - 150 feet;with min assist;with least restrictive assistive device PT Goal: Ambulate - Progress: Progressing toward goal  Visit Information  Last PT Received On: 02/20/12 Assistance Needed: +2    Subjective Data  Subjective: My head gets this funny feeling Patient Stated Goal: Get better. Home   Cognition  Overall Cognitive Status: Appears within functional limits for tasks assessed/performed Area of Impairment: Attention;Problem solving;Awareness of errors Arousal/Alertness: Awake/alert Orientation Level: Appears intact for tasks assessed Behavior During Session: [facility] for tasks performed Awareness of Errors: Assistance required to correct errors made;Assistance required to identify errors made    Balance  Balance Balance Assessed: Yes Static Sitting  Balance Static Sitting - Balance Support: Feet supported;Right upper extremity  supported Static Sitting - Level of Assistance: 5: Stand by assistance Static Sitting - Comment/# of Minutes: Pt doing much better with sitting EOB with stand by assist.  Slight posterior lean, however did not require assist to correct, able to correct with cues.    End of Session PT - End of Session Equipment Utilized During Treatment: Gait belt Activity Tolerance: Patient tolerated treatment well Patient left: in chair;with call bell/phone within reach;with family/visitor present    Page, Meribeth Mattes 02/20/2012, 1:24 PM

## 2012-02-20 NOTE — PMR Pre-admission (Signed)
PMR Admission Coordinator Pre-Admission Assessment  Patient: Hannah Lamb is an 71 y.o., female MRN: [id number] DOB: 1941-04-10 Height: 5\' 7"  (170.2 cm) Weight: 90.1 kg (198 lb 10.2 oz)  Insurance Information HMO:     PPO:      PCP:      IPA:      80/20:      OTHER: X PRIMARY: Medicare      Policy#: [id number]      Subscriber: patient CM Name:       Phone#:      Fax#:  Pre-Cert#:       Employer: retired Benefits:  Phone #: [employer]     Name:  Eff. Date: 11/11/83 A and B     Deduct: $1184      Out of Pocket Max:       Life Max:  CIR: 100%      SNF: LBD= 04/17/06; 100 days Outpatient: 80%     Co-Pay: 20% Home Health: 100%      Co-Pay: 0% DME: 80%     Co-Pay: 20% Providers: in network SECONDARY: Medicaid      Policy#: [id number]      Subscriber:  CM Name:       Phone#:      Fax#:  Pre-Cert#:       Employer:  Benefits:  Phone #:      Name:  Eff. Date:      Deduct:       Out of Pocket Max:       Life Max:  CIR:       SNF:  Outpatient:      Co-Pay:  Home Health:       Co-Pay:  DME:      Co-Pay:   Medicaid Application Date:       Case Manager:  Disability Application Date:       Case Worker:   Emergency Contact Information Contact Information    Name Relation Home Work Mobile   [place]  [phone]     [facility] Daughter        Current Medical History  Patient Admitting Diagnosis:R ight caudate head and anterior aspect of the right lenticular nucleus with smaller scattered infarcts throughout the right frontal lobe./ MS  History of Present Illness: Hannah Lamb is a 71 y.o. right-handed female with history of multiple sclerosis diagnosed 2008 and followed at [facility] on no present medications for MS and hypertension admitted 02/15/2012 with generalized weakness to the point she was unable to ambulate. 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 There are also remote small infarcts of the basal ganglia, thalami and cerebellum bilaterally with prominent white matter type changes fairly confluent in the periventricular region that have not progressed since prior examination. MRI cervical spine with no focal cord signal abnormalities. Neurology services consulted and placed on Plavix therapy for CVA prophylaxis. Cardiology services has been consulted with noted questionable history of syncope with carotid Dopplers showing no ICA stenosis and echocardiogram with ejection fraction of 70% grade 1 diastolic dysfunction. Cardiac enzymes were done that were negative. Bouts of hypokalemia at 2.6 with supplement added and latest potassium 3.9. Venous Doppler studies lower extremities negative for DVT. Cardiology signed off with workup unremarkable. Incidental findings during workup of stroke with cervical MRI showing markedly enlarged thyroid gland with mass greater on the left  compressing surrounding air column and vascular structures. An ultrasound of her neck showed enlarged thyroid with multiple nodules and masses. ENT Dr. Suszanne Conners consulted with plan for biopsy. Interventional radiology notes that patient would need to be off Plavix for 7 days prior to biopsy thus current plan is to follow 1-2 weeks as outpatient with ENT to discuss thyroid biopsy. Physical and occupational therapy ongoing. Recommendations have been made for physical medicine rehabilitation consult to consider inpatient rehabilitation services. Patient was seen by physical medicine rehabilitation consult and felt to be a good candidate for inpatient rehabilitation services       Past Medical History  Past Medical History  Diagnosis Date  . Hypertension   . MS (multiple sclerosis)   . Diabetes mellitus   . Goiter   . Hyperlipidemia     Family History  family history includes Diabetes in her father.  Prior Rehab/Hospitalizations: no prior CIR  stays   Current Medications  Current facility-administered medications:0.9 %  sodium chloride infusion, 250 mL, Intravenous, PRN, Leroy Sea, MD;  acetaminophen (TYLENOL) tablet 650 mg, 650 mg, Oral, Q6H PRN, Rolan Lipa, NP;  cloNIDine (CATAPRES) tablet 0.1 mg, 0.1 mg, Oral, Q6H PRN, Leroy Sea, MD;  clopidogrel (PLAVIX) tablet 75 mg, 75 mg, Oral, Q breakfast, Ulice Dash, PA, 75 mg at 02/20/12 0913 insulin aspart (novoLOG) injection 0-8 Units, 0-8 Units, Subcutaneous, TID WC, Leroy Sea, MD, 2 Units at 02/19/12 1758;  insulin aspart (novoLOG) injection 3 Units, 3 Units, Subcutaneous, TID WC, Leroy Sea, MD, 3 Units at 02/19/12 1759;  insulin aspart protamine-insulin aspart (NOVOLOG 70/30) injection 20 Units, 20 Units, Subcutaneous, Q supper, Leroy Sea, MD insulin aspart protamine-insulin aspart (NOVOLOG 70/30) injection 22 Units, 22 Units, Subcutaneous, Q breakfast, Leroy Sea, MD;  nebivolol (BYSTOLIC) tablet 10 mg, 10 mg, Oral, Daily, Leroy Sea, MD, 10 mg at 02/20/12 0913;  potassium chloride SA (K-DUR,KLOR-CON) CR tablet 20 mEq, 20 mEq, Oral, Daily, Leroy Sea, MD, 20 mEq at 02/20/12 0913 sodium chloride 0.9 % injection 3 mL, 3 mL, Intravenous, Q12H, Haydee Monica, MD, 3 mL at 02/19/12 2204;  sodium chloride 0.9 % injection 3 mL, 3 mL, Intravenous, PRN, Haydee Monica, MD;  DISCONTD: 0.9 %  sodium chloride infusion, 250 mL, Intravenous, PRN, Haydee Monica, MD, Last Rate: 10 mL/hr at 02/16/12 0507, 250 mL at 02/16/12 0507;  DISCONTD: amLODipine (NORVASC) tablet 10 mg, 10 mg, Oral, Daily, Leroy Sea, MD DISCONTD: insulin aspart protamine-insulin aspart (NOVOLOG 70/30) injection 22 Units, 22 Units, Subcutaneous, BID WC, Leroy Sea, MD, 22 Units at 02/20/12 0914;  DISCONTD: niacin tablet 50 mg, 50 mg, Oral, BID WC, Leroy Sea, MD  Patients Current Diet: Carb Control  Precautions / Restrictions Precautions Precautions:  Fall Precaution Comments: R shoulder deficits PTA. Family present and requested limited pulling up on R UE when mobilizing pt Restrictions Weight Bearing Restrictions: No   Prior Activity Level Limited Community (1-2x/wk): did not drive PTA; active around home Journalist, newspaper / Equipment Home Assistive Devices/Equipment: Shower chair without back Home Adaptive Equipment: Walker - rolling;Shower chair with back;Bedside commode/3-in-1  Prior Functional Level Prior Function Level of Independence: Independent with assistive device(s) Able to Take Stairs?: No Driving: No Vocation: Retired Comments: using RW at baseline without assistance  Current Functional Level Cognition  Arousal/Alertness: Awake/alert Overall Cognitive Status: Appears within functional limits for tasks assessed/performed Current Attention Level: Alternating Orientation Level: Oriented X4 Awareness of Errors:  Assistance required to correct errors made;Assistance required to identify errors made Cognition - Other Comments: Increased time for processing    Extremity Assessment (includes Sensation/Coordination)  RUE ROM/Strength/Tone: Deficits RUE ROM/Strength/Tone Deficits: scapula upwardly rotated.  Lifts shoulder approximately 70 degrees with compensation of trunk; AAROM 110 with scapula supported.  Movement ataxic, decreased intrinsics.  Able to grasp  RLE ROM/Strength/Tone: Deficits RLE ROM/Strength/Tone Deficits: Strength at least 4/5 throughout RLE Sensation: WFL - Light Touch RLE Coordination: Deficits    ADLs  Eating/Feeding: Simulated;Set up Where Assessed - Eating/Feeding: Chair Grooming: Simulated;Set up Where Assessed - Grooming: Supported sitting Upper Body Bathing: Simulated;Minimal assistance Where Assessed - Upper Body Bathing: Supported sitting Lower Body Bathing: Simulated;+2 Total assistance Lower Body Bathing: Patient Percentage: 60% Where Assessed - Lower Body Bathing: Supported  sit to stand Upper Body Dressing: Simulated;Moderate assistance Where Assessed - Upper Body Dressing: Supported sitting Lower Body Dressing: Simulated;+2 Total assistance Lower Body Dressing: Patient Percentage: 10% Where Assessed - Lower Body Dressing: Sopported sit to stand Toilet Transfer: Simulated;+2 Total assistance Toilet Transfer: Patient Percentage: 50% Toilet Transfer Method: Stand pivot (pt with strong posterior lean; did better on 2nd try) Toileting - Clothing Manipulation and Hygiene: Simulated;+2 Total assistance Toileting - Clothing Manipulation and Hygiene: Patient Percentage: 20% Equipment Used: Gait belt ADL Comments: Pt able to reach to straighten socks.  Sitting EOB, pt leaning posteriorly.  When in chair, able to work on forward weight shifts.  Fearful of falling but very motivated    Mobility  Bed Mobility: Supine to Sit Supine to Sit: 1: +2 Total assist;HOB flat Supine to Sit: Patient Percentage: 40%    Transfers  Transfers: Sit to Stand;Stand to Sit Sit to Stand: 1: +2 Total assist;From bed;From elevated surface Sit to Stand: Patient Percentage: 50% (40% first trial; 50% second) Stand to Sit: 1: +2 Total assist;To chair/3-in-1;With upper extremity assist Stand to Sit: Patient Percentage: 50%    Ambulation / Gait / Stairs / Wheelchair Mobility  Ambulation/Gait Ambulation/Gait Assistance: 1: +2 Total assist Ambulation/Gait: Patient Percentage: 50% Ambulation Distance (Feet): 6 Feet Assistive device: Rolling walker Ambulation/Gait Assistance Details: Multimodal cues for safety, technique, posture, sequencing. Assist to stabilize and keep pt in correct posture/alignment. Pt leaning moderately to L side with head tilted to L as well. Pt with difficulty advancing L LE. Fatgiues easily. Towards end of walk, when advancing R LE, pt appeared to overshoot step. Gait Pattern: Step-to pattern;Lateral trunk lean to left;Ataxic;Decreased stride length    Posture / Balance  Static Sitting Balance Static Sitting - Balance Support: Bilateral upper extremity supported Static Sitting - Level of Assistance: 3: Mod assist Static Sitting - Comment/# of Minutes: Sat EOB 2-3 minutes working on correction of head/trunk posturing. Pt leaning posteriorly and laterally to L. Had pt work on anterior weightshifting by sliding bil hands down legs then returning to full upright. Pt unable to gauge correct posture-retuned to posterior leaning each time. Therapist positioned her body in front of pt to give visual cue for right alignment.  Static Standing Balance Static Standing - Balance Support: Bilateral upper extremity supported Static Standing - Level of Assistance: 1: +2 Total assist Static Standing - Comment/# of Minutes: Pt=50%. Stood statically for 1-2 minutes, again working on alignment. Pt stated she felt she was falling backwards, even through she was not. Poor perceptual awareness of body in space.      Previous Home Environment Living Arrangements: Children Lives With: Daughter Available Help at Discharge: Family Type of Home: Apartment Home Layout:  One level Home Access: Level entry Bathroom Shower/Tub: Engineer, manufacturing systems: Standard Bathroom Accessibility: Yes How Accessible: Accessible via walker Home Care Services: No  Discharge Living Setting Plans for Discharge Living Setting: Apartment;Lives with (comment);Patient's home (dtr) Type of Home at Discharge: Apartment Discharge Home Layout: One level Discharge Home Access: Level entry Discharge Bathroom Shower/Tub: Tub/shower unit Discharge Bathroom Toilet: Standard Discharge Bathroom Accessibility: Yes How Accessible: Accessible via walker Do you have any problems obtaining your medications?: No  Social/Family/Support Systems Patient Roles: Parent Contact Information: home [phone] Anticipated Caregiver: Dtr- Chynah Orihuela Anticipated Caregiver's Contact Information:  [phone] Ability/Limitations of Caregiver: can assist Caregiver Availability: 24/7 Discharge Plan Discussed with Primary Caregiver: Yes Is Caregiver In Agreement with Plan?: Yes Does Caregiver/Family have Issues with Lodging/Transportation while Pt is in Rehab?: No  Goals/Additional Needs Patient/Family Goal for Rehab: Min A PT; Min A OT; Min A SLP Expected length of stay: 2-3 weeks Cultural Considerations: Jehovah's Witness Dietary Needs: Carb modified Equipment Needs: to be determined Pt/Family Agrees to Admission and willing to participate: Yes Program Orientation Provided & Reviewed with Pt/Caregiver Including Roles  & Responsibilities: Yes  Patient Condition: Please see physician update to information in consult dated 02/17/12 10:06.  Preadmission Screen Completed By:  Oletta Darter, 02/20/2012 11:55 AM ______________________________________________________________________   Discussed status with Dr. Wynn Banker on 02/20/12 at 0900 and received telephone approval for admission today.  Admission Coordinator:  Oletta Darter, time12:01pm /Date6/10/13

## 2012-02-20 NOTE — Progress Notes (Signed)
Occupational Therapy Treatment Patient Details Name: Hannah Lamb MRN: [id number] DOB: 05-19-1941 Today's Date: 02/20/2012 Time: 4098-1191 OT Time Calculation (min): 37 min  OT Assessment / Plan / Recommendation Comments on Treatment Session      Follow Up Recommendations  Inpatient Rehab    Barriers to Discharge       Equipment Recommendations  Defer to next venue    Recommendations for Other Services    Frequency Min 2X/week   Plan Discharge plan remains appropriate    Precautions / Restricti *ons Precautions Precautions: Fall Precaution Comments: R shoulder deficits PTA. Family present and requested limited pulling up on R UE when mobilizing pt Restrictions Weight Bearing Restrictions: No       ADL  Toilet Transfer: Performed;+2 Total assistance Toilet Transfer: Patient Percentage: 50% Toilet Transfer Method: Squat pivot Equipment Used: Gait belt Transfers/Ambulation Related to ADLs: Cotx with PT.  Pt ambulated with A X 2...needed some A to weight shift and advance feet as pt took very small steps.  See PT note ADL Comments: Pt able to lean forward and hold 3:1 commode rail    OT Diagnosis:    OT Problem List:   OT Treatment Interventions:     OT Goals Acute Rehab OT Goals Time For Goal Achievement: 03/02/12 Potential to Achieve Goals: Good ADL Goals Pt Will Transfer to Toilet: with 2+ total assist;3-in-1;Stand pivot transfer; pt 75% ADL Goal: Toilet Transfer - Progress: Progressing toward goals Miscellaneous OT Goals Miscellaneous OT Goal #3: Pt will maintain unsupported sitting at EOB x 5 minutes with occasional min A for ADLs OT Goal: Miscellaneous Goal #3 - Progress: Progressing toward goals  Visit Information  Last OT Received On: 02/20/12 Assistance Needed: +2 PT/OT Co-Evaluation/Treatment: Yes    Subjective Data      Prior Functioning  Home Living Lives With: Daughter Available Help at Discharge: Family Type of Home: Apartment Home Access:  Level entry Home Layout: One level Bathroom Shower/Tub: Engineer, manufacturing systems: Standard Bathroom Accessibility: Yes How Accessible: Accessible via walker Prior Function Level of Independence: Independent with assistive device(s) Able to Take Stairs?: No Driving: No Vocation: Retired    IT consultant  Overall Cognitive Status: Appears within functional limits for tasks assessed/performed Area of Impairment: Attention;Problem solving;Awareness of errors Arousal/Alertness: Awake/alert Orientation Level: Appears intact for tasks assessed Behavior During Session: [facility] for tasks performed Awareness of Errors: Assistance required to correct errors made;Assistance required to identify errors made    Mobility Bed Mobility Bed Mobility: Supine to Sit Supine to Sit: 1: +2 Total assist;HOB flat Supine to Sit: Patient Percentage: 50% Details for Bed Mobility Assistance: Assist for B LEs off of bed and trunk to attain sitting.  continue to provide cues for hand placement to self assist trunk.   Transfers Sit to Stand: 1: +2 Total assist;From bed;From elevated surface Sit to Stand: Patient Percentage: 50% Stand to Sit: 1: +2 Total assist;To chair/3-in-1;With upper extremity assist Stand to Sit: Patient Percentage: 50% Details for Transfer Assistance: Provided verbal cues for safety, technique and hand placement.  Performed squat pivot from bed to 3in1 due to pt needing to use restroom.  then performed sit to/from stand with cues mentioned before.    Exercises    Balance Balance Balance Assessed: Yes Static Sitting Balance Static Sitting - Balance Support: Feet supported;Right upper extremity supported Static Sitting - Level of Assistance: 5: Stand by assistance Static Sitting - Comment/# of Minutes: Pt doing much better with sitting EOB with stand by assist.  Slight posterior lean, however did not require assist to correct, able to correct with cues.    End of Session OT - End of  Session Equipment Utilized During Treatment: Gait belt;Other (comment) Activity Tolerance: Patient tolerated treatment well Patient left: in chair;with chair alarm set;with call bell/phone within reach   [facility] 02/20/2012, 1:31 PM Marica Otter, OTR/L [phone] 02/20/2012

## 2012-02-20 NOTE — Progress Notes (Addendum)
I spent close to an hour in the room talking with the pt and her family explaining the need for her BP and cholesterol medications only to have pt and family to continue to refuse. Family states that Dr.Garber??? Is her PCP and that he would know and be in charge of all of her home med's and was also the one that had prescribed all prior home meds. Although when questioned about specific med's the story then becomes unclear as to which doctor (one from [facility] hospital or one in [facility] that she "sees") that had prescribed what medication and what each med was for, or why she had stopped taking medication at home prior to admission. Will continue to follow and monitor pt until dc.

## 2012-02-20 NOTE — Plan of Care (Signed)
Overall Plan of Care [facility]) Patient Details Name: Hannah Lamb MRN: [id number] DOB: [birth year]  Diagnosis:  Rehabilitation for CVA  Primary Diagnosis:    CVA (cerebral infarction) Co-morbidities: diabetes,HTN  Functional Problem List  Patient demonstrates impairments in the following areas: Balance, Bladder, Bowel, Endurance, Medication Management, Motor, Pain, Safety and Skin Integrity  Basic ADL's: grooming, bathing, dressing and toileting Advanced ADL's: simple meal preparation  Transfers:  bed mobility, bed to chair, toilet, tub/shower, car and furniture Locomotion:  ambulation and stairs  Additional Impairments:  None  Anticipated Outcomes Item Anticipated Outcome  Eating/Swallowing    Basic self-care  Supervision  Tolieting  Modified Independent  Bowel/Bladder  Patient incontinent of bowel and bladder; time toileting q3h  Transfers  Supervision  Locomotion  supervision  Communication  independent  Cognition  Modified independent  Pain  2 or less on scale 0-10  Safety/Judgment  Patient will call for assist, no fall while in rehab.  Other     Therapy Plan:   OT Frequency: 1-2 X/day, 60-90 minutes     Team Interventions: Item RN PT OT SLP SW TR Other  Self Care/Advanced ADL Retraining   x      Neuromuscular Re-Education  x x      Therapeutic Activities  x x      UE/LE Strength Training/ROM  x x      UE/LE Coordination Activities  x x      Visual/Perceptual Remediation/Compensation   x      DME/Adaptive Equipment Instruction  x x      Therapeutic Exercise  x x      Balance/Vestibular Training  x x      Patient/Family Education x x x      Cognitive Remediation/Compensation         Functional Mobility Training  x x      Ambulation/Gait Training  x       Biomedical engineer         Bladder Management x        Bowel Management x        Disease Management/Prevention         Pain Management x x       Medication Management x        Skin Care/Wound Management x        Splinting/Orthotics         Discharge Planning  x   x    Psychosocial Support     x                       Team Discharge Planning: Destination:  Home Projected Follow-up:  PT and Home Health Projected Equipment Needs:  Bedside Commode Patient/family involved in discharge planning:  Yes  MD ELOS: 2wks Medical Rehab Prognosis:  Good Assessment: 71 yo female with MS admitted for R cortical and subcortical infarcts now requiring CIR level PT , OT, 24/7 rehab RN and MD

## 2012-02-20 NOTE — Progress Notes (Signed)
02-19-12  NSG: Did finally allow [place] to clean and  turn her.  Pt refusing to let us turn her q2 thru the night but has agreed to let us clean and turn her q4 for the rest of the shift.

## 2012-02-20 NOTE — H&P (Signed)
Physical Medicine and Rehabilitation Admission H&P    No chief complaint on file. : HPI: Hannah Lamb is a 71 y.o. right-handed female with history of multiple sclerosis diagnosed 2008 and followed at [facility] on no present medications for MS and hypertension admitted 02/15/2012 with generalized weakness to the point she was unable to ambulate. MRI of the brain revealed acute infarct involving the right caudate head and anterior aspect of the right lenticular nucleus with smaller scattered infarcts throughout the right frontal lobe. There are also remote small infarcts of the basal ganglia, thalami and cerebellum bilaterally with prominent white matter type changes fairly confluent in the periventricular region that have not progressed since prior examination. MRI cervical spine with no focal cord signal abnormalities. Neurology services consulted and placed on Plavix therapy for CVA prophylaxis.  Cardiology services has been consulted with noted questionable history of syncope with carotid Dopplers showing no ICA stenosis and echocardiogram with ejection fraction of 70% grade 1 diastolic dysfunction. Cardiac enzymes were done that were negative. Bouts of hypokalemia at 2.6 with supplement added and latest potassium 3.9. Venous Doppler studies lower extremities negative for DVT. Cardiology signed off with workup unremarkable. 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 An ultrasound of her neck showed enlarged thyroid with multiple nodules and masses. ENT Dr. Suszanne Conners consulted with plan for biopsy. Interventional radiology notes that patient would need to be off Plavix for 7 days prior to biopsy thus current plan is to follow 1-2 weeks as outpatient with ENT to discuss thyroid biopsy. Physical and occupational therapy ongoing. Recommendations have been made for  physical medicine rehabilitation consult to consider inpatient rehabilitation services. Patient was seen by physical medicine rehabilitation consult and felt to be a good candidate for inpatient rehabilitation services  Review of Systems  Gastrointestinal: Positive for constipation.  Musculoskeletal: Positive for myalgias and falls.  Neurological: Positive for weakness.  All other systems reviewed and are negative   Past Medical History  Diagnosis Date  . Hypertension   . MS (multiple sclerosis)   . Diabetes mellitus   . Goiter   . Hyperlipidemia    Past Surgical History  Procedure Date  . Abdominal hysterectomy    Family History  Problem Relation Age of Onset  . Diabetes Father    Social History:  reports that she has never smoked. She does not have any smokeless tobacco history on file. She reports that she does not drink alcohol or use illicit drugs. Allergies:  Allergies  Allergen Reactions  . Sulfa Drugs Cross Reactors     dizziness  . Lipitor (Atorvastatin) Other (See Comments)    Muscle weakness   Medications Prior to Admission  Medication Sig Dispense Refill  . cloNIDine (CATAPRES) 0.1 MG tablet Take 1 tablet (0.1 mg total) by mouth every 6 (six) hours as needed (SBP>150).  30 tablet  0  . clopidogrel (PLAVIX) 75 MG tablet Take 1 tablet (75 mg total) by mouth daily with breakfast.  30 tablet  0  . insulin aspart (NOVOLOG) 100 UNIT/ML injection Inject 0-10 Units into the skin 3 (three) times daily with meals. Before each meal 3 times a day, 140-199 - 2 units, 200-250 - 4 units, 251-299 - 6 units,  300-349 - 8 units,  350 or above 10 units.  1 vial  0  . insulin aspart protamine-insulin aspart (NOVOLOG 70/30) (70-30) 100 UNIT/ML  injection Subcutaneous 22 units with breakfast and 20 units with supper. Hold if patient does not eat more than 50% of her meal or her glucose is less than 130.  10 mL  0  . nebivolol (BYSTOLIC) 10 MG tablet Take 10 mg by mouth daily.      .  potassium chloride (MICRO-K) 10 MEQ CR capsule Take 10 mEq by mouth 2 (two) times daily.        Home:     Functional History:    Functional Status:  Mobility:          ADL:    Cognition:       There were no vitals taken for this visit. Physical Exam  Constitutional: She is oriented to person, place, and time. She appears well-developed and well-nourished.  HENT:  Head: Normocephalic and atraumatic.  Right Ear: External ear normal.  Left Ear: External ear normal.  Nose: Nose normal.  Mouth/Throat: Oropharyngeal exudate present.  Eyes: Conjunctivae are normal. Pupils are equal, round, and reactive to light. Right eye exhibits no discharge.  Neck: Neck supple. No thyromegaly present.  Cardiovascular: Normal rate and regular rhythm.  Pulmonary/Chest: Effort normal and breath sounds normal. She has no wheezes.  Abdominal: Bowel sounds are normal. She exhibits no distension. There is no tenderness.  Musculoskeletal: She exhibits no edema.  Neurological: She is alert and oriented to person, place, and time.  Follows three-step commands and appropriate during exam.Perhaps a little apraxic. No gross sensory findings. Smile asymmetrical but not consistent with stroke location. No focal CN otherwise.  Skin: Skin is warm and dry.  Psychiatric: She has a normal mood and affect. Her speech is normal and behavior is normal. Cognition and memory are not impaired. She exhibits normal recent memory and normal remote memory Motor strength 3 minus/5 in the left ankle dorsiflexor plantar flexor hip flexor and knee extensor 4/5 in the right hip flexor, knee extensors, ankle dorsiflexor and plantar flexor Motor strength is 4/5 in bilateral grip, bicep, tricep 3 minus at the right deltoid and 4 months at the left deltoid Sensation is intact to light touch in both upper and lower extremities Cranial nerves II through XII are intact Cerebellar testing shows mild dysmetria finger nose to finger  in the left upper extremity. Testing limited in the right upper extremity do to right shoulder range of motion Heel-to-shin limited bilaterally do to weakness Right shoulder has positive impingement sign, positive drop arm sign Results for orders placed during the hospital encounter of 02/15/12 (from the past 48 hour(s))  GLUCOSE, CAPILLARY     Status: Abnormal   Collection Time   02/18/12  5:25 PM      Component Value Range Comment   Glucose-Capillary 185 (*) 70 - 99 (mg/dL)   GLUCOSE, CAPILLARY     Status: Abnormal   Collection Time   02/18/12  7:11 PM      Component Value Range Comment   Glucose-Capillary 202 (*) 70 - 99 (mg/dL)   GLUCOSE, CAPILLARY     Status: Abnormal   Collection Time   02/18/12 10:03 PM      Component Value Range Comment   Glucose-Capillary 133 (*) 70 - 99 (mg/dL)   CK     Status: Normal   Collection Time   02/19/12  5:21 AM      Component Value Range Comment   Total CK 93  7 - 177 (U/L)   GLUCOSE, CAPILLARY     Status: Abnormal   Collection  Time   02/19/12  7:29 AM      Component Value Range Comment   Glucose-Capillary 205 (*) 70 - 99 (mg/dL)   PRO B NATRIURETIC PEPTIDE     Status: Abnormal   Collection Time   02/19/12 10:05 AM      Component Value Range Comment   Pro B Natriuretic peptide (BNP) 444.3 (*) 0 - 125 (pg/mL)   D-DIMER, QUANTITATIVE     Status: Abnormal   Collection Time   02/19/12 10:05 AM      Component Value Range Comment   D-Dimer, Quant 14.01 (*) 0.00 - 0.48 (ug/mL-FEU)   GLUCOSE, CAPILLARY     Status: Abnormal   Collection Time   02/19/12 11:52 AM      Component Value Range Comment   Glucose-Capillary 147 (*) 70 - 99 (mg/dL)   GLUCOSE, CAPILLARY     Status: Abnormal   Collection Time   02/19/12  5:18 PM      Component Value Range Comment   Glucose-Capillary 238 (*) 70 - 99 (mg/dL)   GLUCOSE, CAPILLARY     Status: Normal   Collection Time   02/19/12 10:05 PM      Component Value Range Comment   Glucose-Capillary 78  70 - 99 (mg/dL)     Comment 1 Notify RN      Comment 2 Documented in Chart     GLUCOSE, CAPILLARY     Status: Normal   Collection Time   02/20/12  7:58 AM      Component Value Range Comment   Glucose-Capillary 98  70 - 99 (mg/dL)   POTASSIUM     Status: Normal   Collection Time   02/20/12  8:43 AM      Component Value Range Comment   Potassium 3.6  3.5 - 5.1 (mEq/L)   MAGNESIUM     Status: Normal   Collection Time   02/20/12  8:43 AM      Component Value Range Comment   Magnesium 1.8  1.5 - 2.5 (mg/dL)   GLUCOSE, CAPILLARY     Status: Normal   Collection Time   02/20/12 11:45 AM      Component Value Range Comment   Glucose-Capillary 72  70 - 99 (mg/dL)   GLUCOSE, CAPILLARY     Status: Normal   Collection Time   02/20/12  1:24 PM      Component Value Range Comment   Glucose-Capillary 76  70 - 99 (mg/dL)   GLUCOSE, CAPILLARY     Status: Abnormal   Collection Time   02/20/12  2:50 PM      Component Value Range Comment   Glucose-Capillary 138 (*) 70 - 99 (mg/dL)    No results found.  Post Admission Physician Evaluation: 1. Functional deficits secondary  to right basal ganglia as well as scattered right frontal lobe infarcts causing primarily left lower tremor the weakness in addition she has right shoulder contracture due to rotator cuff disease. 2. Patient is admitted to receive collaborative, interdisciplinary care between the physiatrist, rehab nursing staff, and therapy team. 3. Patient's level of medical complexity and substantial therapy needs in context of that medical necessity cannot be provided at a lesser intensity of care such as a SNF. 4. Patient has experienced substantial functional loss from his/her baseline which was documented above under the "Functional History" and "Functional Status" headings.  Judging by the patient's diagnosis, physical exam, and functional history, the patient has potential for functional progress which will result  in measurable gains while on inpatient rehab.  These  gains will be of substantial and practical use upon discharge  in facilitating mobility and self-care at the household level. 5. Physiatrist will provide 24 hour management of medical needs as well as oversight of the therapy plan/treatment and provide guidance as appropriate regarding the interaction of the two. 6. 24 hour rehab nursing will assist with bladder management, bowel management, safety, skin/wound care, disease management, medication administration and patient education  and help integrate therapy concepts, techniques,education, etc. 7. PT will assess and treat for:  Pre-gait training, gait training, endurance, safety, quit.  Goals are: provision for all mobility. 8. OT will assess and treat for: ADLs, cognitive perceptual skills, safety, endurance, neuromuscular reeducation, scapular strengthening.   Goals are: increased right shoulder range of motion, improve ADLs to supervision level. 9. SLP will assess and treat for: not applicable.  Goals are: not applicable. 10. Case Management and Social Worker will assess and treat for psychological issues and discharge planning. 11. Team conference will be held weekly to assess progress toward goals and to determine barriers to discharge. 12. Patient will receive at least 3 hours of therapy per day at least 5 days per week. 13. ELOS: 10-12 days      Prognosis:  good   Medical Problem List and Plan: 1. Right caudate head and anterior aspect of the right lenticular nuclear slow small scattered infarcts throughout the right frontal lobe 2. DVT Prophylaxis/Anticoagulation: SCDs. Monitor for any signs of DVT 3. multiple sclerosis. MRI and C-spine showed no new lesions. Patient on no medication for MS prior to hospital admission 4. Diabetes mellitus. Hemoglobin A1c 10.5. Insulin aspart (novolog) 3 units 3 times a day and NovoLog 7030 22 units at breakfast 20 units supper. Check blood sugars a.c. and at bedtime 5. Hypertension.  bystolic 10 mg daily.  Monitor with increased mobility 6. Hyperlipidemia. Lipitor 7. Hypokalemia. Followup labs 8. Thyroid mass. Plan is to followup with Dr. Suszanne Conners for biopsy as an outpatient. Patient would need to be off Plavix x7 days prior to procedure 9. Right rotator cuff disease probable tear, OT for range of motion and scapular stabilization  02/20/2012, 5:03 PM

## 2012-02-20 NOTE — Progress Notes (Signed)
Patient admitted to rehab unit at 1715 by care link with two daughters accompanied.  Patient came from [facility].  Report received from Gae Dry, RN.  Patient and family oriented to Rehab unit, call bell within reach, bed alarm set.

## 2012-02-20 NOTE — Progress Notes (Signed)
I have bed availability today and would like to bring patient to CIR. Discussed with her family at bedside and MD and all agreeable. I have updated bedside RN, as well as CM and CSW. Marina Goodell adm coordinator [phone].

## 2012-02-20 NOTE — Progress Notes (Signed)
Patient ID: Hannah Lamb, female   DOB: 09/21/1940, 71 y.o.   MRN: [id number] BUNNY KLEIST  71 y.o.  female  Subjective: No complaints "breakfast is excellent"  Allergy: Sulfa drugs cross reactors and Lipitor  Objective: Vital signs in last 24 hours: Temp:  [98.7 F (37.1 C)-99 F (37.2 C)] 98.7 F (37.1 C) (06/10 0601) Pulse Rate:  [64-69] 64  (06/10 0601) Resp:  [18-20] 18  (06/10 0601) BP: (176-186)/(77-82) 183/82 mmHg (06/10 0601) SpO2:  [98 %-100 %] 98 % (06/10 0601)  87.9 kg (193 lb 12.6 oz) Body mass index is 30.35 kg/(m^2).  Weight change:  Last BM Date: 02/18/12  Intake/Output from previous day: 06/09 0701 - 06/10 0700 In: 720 [P.O.:720] Out: -   Total I&O since admission: -0.7 L  General- Well developed black female  no acute distress  Neck- No JVD, no carotid bruits Lungs- clear lung fields; normal I:E ratio Cardiovascular- normal PMI; normal S1 and S2; grade 1-2 systolic ejection murmur and fourth heart sound present Abdomen- normal bowel sounds; soft and non-tender without masses or organomegaly Skin- Warm, no significant lesions Extremities-  no edema   Imaging Studies/Results: US Soft Tissue Head/neck  02/17/2012 .  IMPRESSION: Enlarged thyroid with multiple nodules and masses.  I suspect the left lobe is completely replaced by a single mass.  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  Recommend tissue sampling of the left thyroid lobe.  Original Report Authenticated By: Cyndie Chime, M.D.   Imaging: Imaging results have been reviewed  Medications:  I have reviewed the patient's current medications. Scheduled:  Principal Problem:  *Hypertensive emergency Active Problems:  DIABETES MELLITUS II, UNCOMPLICATED  HYPERCHOLESTEROLEMIA  HYPERTENSION, BENIGN SYSTEMIC  Weakness generalized  Multiple sclerosis  Abnormal EKG   Assessment/Plan: ASSESSMENT AND PLAN:  1) Abnormal EKG: Echocardiogram shows moderate LVH with normal systolic function.  EKG  changes are consistent with those caused by LVH.  Patient was also markedly hypokalemic on admission, which may have contributed to EKG abnormalities.   ECG on 6/9 With LVH and biphasic lateral T wave changes 2) Elevated proBNP: Nondiagnostic; no clinical evidence for congestive heart failure.  May have been related to hypertension out of control with elevated diastolic left ventricular pressures. Repeat considerably lower at 444.   3) HTN: improving  4) Syncope: AMS in the ER was related to medications.  5) Murmur: No significant valvular disease by echocardiography Benign SEM 6) Treatment with clopidogrel-indication uncertain. If there is no clear reason for use of this drug, it can be discontinued.  With minimally elevated d-dimer and negative LE venous duplex and no clinical suspicion of PE agree with medicine that PE diagnosis is quite low  Will sign off    Charlton Haws 02/20/2012, 8:12 AM

## 2012-02-20 NOTE — Progress Notes (Signed)
[facility]                                                                                 Patient Demographics  Hannah Lamb, is a 71 y.o. female  NWG:[id number]  [id number]:[id number]  DOB - 1941-03-02  Admit date - 02/15/2012  Admitting Physician Cristal Ford, MD  Outpatient Primary MD for the patient is No primary provider on file.  LOS - 5    Chief Complaint  Patient presents with  . Shoulder Pain        Subjective:   Roderic Palau today has, No headache, No chest pain, No abdominal pain - No Nausea, No new weakness tingling or numbness, No Cough - SOB.   Objective:   Filed Vitals:   02/19/12 0528 02/19/12 1309 02/19/12 2207 02/20/12 0601  BP: 162/77 176/77 186/81 183/82  Pulse: 62 69 66 64  Temp: 97.5 F (36.4 C) 98.9 F (37.2 C) 99 F (37.2 C) 98.7 F (37.1 C)  TempSrc: Oral Axillary Oral Oral  Resp: 18 19 20 18   Height:      Weight: 87.9 kg (193 lb 12.6 oz)     SpO2: 97% 100% 98% 98%    Wt Readings from Last 3 Encounters:  02/19/12 87.9 kg (193 lb 12.6 oz)     Intake/Output Summary (Last 24 hours) at 02/20/12 0854 Last data filed at 02/20/12 0700  Gross per 24 hour  Intake    720 ml  Output      0 ml  Net    720 ml    Exam Awake Alert, Oriented *3, No new F.N deficits, Normal affect, generalized weakness lower extremities more than upper worse in the left lower leg strength 4 x 5 in lower extremity 5 x 5 in upper extremity. [place].AT,PERRAL Supple Neck,No JVD, No cervical lymphadenopathy appriciated.  Symmetrical Chest wall movement, Good air movement bilaterally, CTAB RRR,No Gallops,Rubs or new Murmurs, No Parasternal Heave +ve B.Sounds, Abd Soft, Non tender, No organomegaly appriciated, No rebound -guarding or rigidity. No Cyanosis, Clubbing or edema, No new Rash or bruise  Data Review  CBC  Lab 02/17/12 0454 02/16/12 0200 02/15/12 2145  WBC 6.5 8.5 6.3  HGB 11.9* 14.4 13.7  HCT 35.9* 42.0 40.1  PLT 150 174 185  MCV 85.1 83.7  84.4  MCH 28.2 28.7 28.8  MCHC 33.1 34.3 34.2  RDW 14.3 13.7 13.6  LYMPHSABS -- -- 2.1  MONOABS -- -- 0.3  EOSABS -- -- 0.1  BASOSABS -- -- 0.0  BANDABS -- -- --    Chemistries   Lab 02/17/12 1255 02/17/12 0454 02/16/12 1329 02/16/12 0200 02/15/12 2145  NA -- 137 -- 139 140  K -- 3.9 3.5 2.6* 3.3*  CL -- 99 -- 98 100  CO2 -- 28 -- 30 32  GLUCOSE -- 207* -- 215* 184*  BUN -- 16 -- 7 8  CREATININE -- 0.98 -- 0.73 0.61  CALCIUM -- 9.6 -- 9.0 9.5  MG 1.5 -- -- 1.5 --  AST -- -- -- -- 16  ALT -- -- -- -- 9  ALKPHOS -- -- -- -- 60  BILITOT -- -- -- -- 0.3   ------------------------------------------------------------------------------------------------------------------  estimated creatinine clearance is 59.9 ml/min (by C-G formula based on Cr of 0.98). ------------------------------------------------------------------------------------------------------------------ No results found for this basename: HGBA1C:2 in the last 72 hours ------------------------------------------------------------------------------------------------------------------ No results found for this basename: CHOL:2,HDL:2,LDLCALC:2,TRIG:2,CHOLHDL:2,LDLDIRECT:2 in the last 72 hours ------------------------------------------------------------------------------------------------------------------  Basename 02/17/12 1255  TSH 0.538  T4TOTAL --  T3FREE --  THYROIDAB --   ------------------------------------------------------------------------------------------------------------------ No results found for this basename: VITAMINB12:2,FOLATE:2,FERRITIN:2,TIBC:2,IRON:2,RETICCTPCT:2 in the last 72 hours  Coagulation profile No results found for this basename: INR:5,PROTIME:5 in the last 168 hours   Basename 02/19/12 1005  DDIMER 14.01*    Cardiac Enzymes  Lab 02/16/12 1825 02/16/12 0959 02/16/12 0200  CKMB 2.4 2.4 2.7  TROPONINI <0.30 <0.30 <0.30  MYOGLOBIN -- -- --    ------------------------------------------------------------------------------------------------------------------ No components found with this basename: POCBNP:3  Micro Results No results found for this or any previous visit (from the past 240 hour(s)).  Radiology Reports Dg Chest 2 View  02/15/2012  *RADIOLOGY REPORT*  Clinical Data: Weakness.  CHEST - 2 VIEW  Comparison: 04/10/2006.  Findings: The heart is mildly enlarged but stable.  There is tortuosity of the thoracic aorta.  The lungs are clear of acute process.  Mild chronic bronchitic changes.  No pleural effusion or pneumothorax.  The bony thorax is intact.  IMPRESSION: Mild cardiac enlargement, stable. Chronic bronchitic type changes but no acute pulmonary findings.  Original Report Authenticated By: P. Loralie Champagne, M.D.   Dg Shoulder Right  02/15/2012  *RADIOLOGY REPORT*  Clinical Data: Right shoulder pain.  RIGHT SHOULDER - 2+ VIEW  Comparison: None  Findings: The joint spaces are maintained.  Mild glenohumeral and AC joint degenerative changes.  No acute fracture or abnormal soft tissue calcifications.  The right lung apex is clear.  The right upper ribs are intact.  IMPRESSION: Mild degenerative changes but no fracture or dislocation.  Original Report Authenticated By: P. Loralie Champagne, M.D.   Ct Head Wo Contrast  02/16/2012  *RADIOLOGY REPORT*  Clinical Data: Weakness.  CT HEAD WITHOUT CONTRAST  Technique:  Contiguous axial images were obtained from the base of the skull through the vertex without contrast.  Comparison: Head CT 05/27/2007.  Brain MRI 05/27/2007.  Findings: The there are multiple well defined foci of low attenuation throughout the basal ganglia bilaterally, similar to the prior study from 05/27/2007, compatible with old lacunar infarctions.  Mild cerebral and cerebellar atrophy is noted.  Old cerebellar infarctions in the right cerebellar hemisphere are also noted.  There are extensive patchy and confluent areas of  decreased attenuation throughout the deep and periventricular white matter of the cerebral hemispheres bilaterally, consistent with chronic microvascular ischemic disease.  No definite acute intracranial abnormalities.  Specifically, no overt signs of acute/subacute cerebral ischemia, no acute intracranial hemorrhage, no focal mass, mass effect or hydrocephalous.  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  No acute displaced skull fractures are identified.  Visualized paranasal sinuses and mastoids are well pneumatized.  IMPRESSION: 1.  No acute intracranial abnormalities. 2.  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  In addition, there is extensive chronic microvascular ischemic disease of the white matter. 3.  Mild cerebral and cerebellar atrophy.  Original Report Authenticated By: Florencia Reasons, M.D.   Mr Laqueta Jean Wo Contrast  02/16/2012  *RADIOLOGY REPORT*  Clinical Data: Diabetic hypertensive with a history of multiple sclerosis presenting with weakness and difficulty ambulating.  MRI HEAD WITHOUT AND WITH CONTRAST  Technique:  Multiplanar, multiecho pulse sequences of  the brain and surrounding structures were obtained according to standard protocol without and with intravenous contrast  Contrast: 17mL MULTIHANCE GADOBENATE DIMEGLUMINE 529 MG/ML IV SOLN  Comparison: 02/16/2012 CT. 05/27/2007 MR.  Findings: Areas of restricted motion within the right frontal lobe more consistent with acute infarcts than restricted motion related to active MS plaques.  This includes infarct involving the right caudate head and the anterior aspect of the right lenticular nucleus with smaller scattered infarcts throughout the right frontal lobe.  Remote small infarcts of the basal ganglia, thalami and cerebellum bilaterally.  Prominent white matter type changes fairly confluent in the periventricular region having progressed since prior  examination. This may represent combination of result of multiple sclerosis and / or small vessel disease.  Global atrophy.  Ventricular prominence felt to be related to atrophy rather hydrocephalus.  No intracranial mass.  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  Ectatic vertebral arteries and basilar artery. Basilar artery causes impression upon the undersurface of the hypothalamus. Ectatic patent carotid arteries.  Mild exophthalmos.  Minimal paranasal sinus mucosal thickening.  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  Remote small infarcts of the basal ganglia, thalami and cerebellum bilaterally.  Prominent white matter type changes fairly confluent in the periventricular region having progressed since prior examination. This may represent combination of result of multiple sclerosis and / or small vessel disease.  Global atrophy.  This has been made a PRA call report utilizing dashboard call feature.  Original Report Authenticated By: Fuller Canada, M.D.   Mr Cervical Spine W Wo Contrast  02/16/2012  *RADIOLOGY REPORT*  Clinical Data: Diabetic hypertensive with multiple sclerosis and difficulty walking.  MRI CERVICAL SPINE WITHOUT AND WITH CONTRAST  Technique:  Multiplanar and multiecho pulse sequences of the cervical spine, to include the craniocervical junction and cervicothoracic junction, were obtained according to standard protocol without and with intravenous contrast.  Contrast:  17 ml MultiHance.  Comparison: MR brain performed the same date.  No comparison cervical spine MR.  Findings: Markedly enlarged heterogeneous thyroid gland with dominant mass on the left displacing surrounding airway and vascular structures.  Thyroid ultrasound recommended for further delineation.  No focal cervical cord signal  abnormality or enhancement detected on this motion degraded exam.  C2-3:  Minimal bulge.  C3-4:  Mild bulge.  Minimal left uncinate bony overgrowth and foraminal narrowing.  C4-5:  Moderate broad-based protrusion.  Moderate spinal stenosis and mild cord flattening.  Uncinate bony overgrowth.  Moderate foraminal narrowing greater on the right.  C5-6:  Small to moderate broad-based right posterior lateral disc protrusion and osteophyte with mild compression of the right aspect of the cord.  Uncinate bony overgrowth.  Moderate to marked right- sided and mild left-sided foraminal narrowing.  C6-7:  Minimal bulge.  C7-T1:  Mild facet joint degenerative changes.  Minimal bulge.  T1-2:  Mild facet joint degenerative changes.  Minimal bulge.  T2-3:  Suggestion of posterior lateral tiny protrusion greater on the right.  Axial images not obtained.  IMPRESSION: No focal cord signal abnormality or abnormal enhancement noted on this motion degraded exam.  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  Markedly enlarged thyroid gland with mass greater on the left compressing surrounding air column and vascular structures. Thyroid ultrasound recommended for further delineation.  This has been made a  PRA call report utilizing dashboard call feature.  Original Report Authenticated By: Fuller Canada, M.D.   US Soft Tissue Head/neck  02/17/2012  *RADIOLOGY REPORT*  Clinical Data: Abnormal MRI.  Thyroid mass.  THYROID ULTRASOUND  Technique: Ultrasound examination of the thyroid gland and adjacent soft tissues was performed.  Comparison:  MRI of the cervical spine 02/16/2012  Findings:  Right thyroid lobe:  6.4 x 4.0 x 2.0 cm. Left thyroid lobe:  8.9 x 4.0 x 5.6 cm. Isthmus:  2.4 cm.  Focal nodules:  Multiple complex solid and cystic and solid heterogeneous nodules within the right thyroid lobe, including a 2.3 cm complex cystic and solid nodule in the upper pole, 2.6 cm solid nodule in the mid pole,  and 2.4 cm complex lesion in the lower pole.  Much of the left lobe is likely replaced by a large heterogeneous solid mass, difficult to measure.  Lymphadenopathy:  None visualized.  IMPRESSION: Enlarged thyroid with multiple nodules and masses.  I suspect the left lobe is completely replaced by a single mass.  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  Recommend tissue sampling of the left thyroid lobe.  Original Report Authenticated By: Cyndie Chime, M.D.    Carotid Duplex  No significant extracranial carotid artery stenosis demonstrated. Vertebrals are patent with antegrade flow.    Echo  Left ventricle: The cavity size was normal. Wall thickness was increased in a pattern of moderate LVH. Systolic function was vigorous. The estimated ejection fraction was in the range of 65% to 70%. Doppler parameters are consistent with abnormal left ventricular relaxation (grade 1 diastolic dysfunction).     Scheduled Meds:    . amLODipine  10 mg Oral Daily  . clopidogrel  75 mg Oral Q breakfast  . insulin aspart  0-8 Units Subcutaneous TID WC  . insulin aspart  3 Units Subcutaneous TID WC  . insulin aspart protamine-insulin aspart  22 Units Subcutaneous BID WC  . nebivolol  10 mg Oral Daily  . niacin  50 mg Oral BID WC  . potassium chloride  20 mEq Oral Daily  . sodium chloride  3 mL Intravenous Q12H  . DISCONTD: amLODipine  5 mg Oral Daily  . DISCONTD: insulin aspart protamine-insulin aspart  20 Units Subcutaneous BID WC   Continuous Infusions:  PRN Meds:.sodium chloride, acetaminophen, sodium chloride, DISCONTD: sodium chloride, DISCONTD: hydrALAZINE  Assessment & Plan   1. 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 Continue risk factor modification. Neurology following, echo and  carotid duplex stable.    2. History of MS no signs of acute flare outpatient monitoring.    3. Hypertension stable continue present medications blood pressure was high upon admission secondary to CVA. At to titrate blood pressure medications but family persistently refusing new medications or change in dosages of present medicines.    4. Diabetes mellitus type 2 sugars are very labile patient despite much than lower home dose NPH had episodes of hypoglycemia on day one, now sugars are high, we will gently increase NPH to twice a day dose but still lower than home dose, continue sliding scale insulin with meals, continue close monitoring of CBGs. NPH will be adjusted today along with Pre meal.   Lab Results  Component Value Date   HGBA1C 10.5* 02/16/2012    CBG (  last 3)   Basename 02/20/12 0758 02/19/12 2205 02/19/12 1718  GLUCAP 98 78 238*     5. Dyslipidemia patient  Refused statin and niacin, defer to  primary care physician for further management.     6. History of thyroid mass appreciated ENT input, TSH is stable, patient scheduled for ultrasound-guided biopsy(initially IR told that Plavix no contraindication, but later IR PA called and said will need to do as outpt as Plavix to be held 7 days per hospital protocol) will have her follow with ENT in 1-2 weeks and decide as outpt.    7. Abnormal EKG. Patient is pain-free, ruled out for MI, on Plavix, statin, beta blocker, echo stable, appreciate cardiology input. No further inpatient testing suggested by cardiology.    8. Elevated D-dimer ordered by cardiologist Dr. Dietrich Pates - suspicion  of PE remains low,  Leg [place] -ve, No chest pain and No SOB, D/W Cards Dr Eden Emms, low low likely hood of PE, family agrees and will not like to have CT can at this point.    I have personally spent Minimum of 20-25 minutes each day in the room talking to patient and family in detail, despite all clarifications and warning patient and family  continued to refuse medications on a regular basis, and they will be responsible for any adverse consequences including stroke, death, disability.    DVT Prophylaxis   SCDs    Procedures CT brain, MRI brain and neck, echogram, carotid duplex, ultrasound-guided thyroid biopsy pending.    Consults neurology, ENT, PT OT speech   Leroy Sea M.D on 02/20/2012 at 8:54 AM  Between 7am to 7pm - Pager - [phone]  After 7pm go to [email or web address] - password [email or web address]  And look for the night coverage person covering for me after hours  [facility] Office  [phone]

## 2012-02-21 DIAGNOSIS — Z8673 Personal history of transient ischemic attack (TIA), and cerebral infarction without residual deficits: Secondary | ICD-10-CM

## 2012-02-21 DIAGNOSIS — Z5189 Encounter for other specified aftercare: Secondary | ICD-10-CM

## 2012-02-21 DIAGNOSIS — G8110 Spastic hemiplegia affecting unspecified side: Secondary | ICD-10-CM

## 2012-02-21 DIAGNOSIS — G35 Multiple sclerosis: Secondary | ICD-10-CM

## 2012-02-21 DIAGNOSIS — I6330 Cerebral infarction due to thrombosis of unspecified cerebral artery: Secondary | ICD-10-CM

## 2012-02-21 LAB — DIFFERENTIAL
Basophils Absolute: 0 10*3/uL (ref 0.0–0.1)
Eosinophils Relative: 4 % (ref 0–5)
Lymphocytes Relative: 50 % — ABNORMAL HIGH (ref 12–46)
Lymphs Abs: 3.2 10*3/uL (ref 0.7–4.0)
Neutrophils Relative %: 38 % — ABNORMAL LOW (ref 43–77)

## 2012-02-21 LAB — GLUCOSE, CAPILLARY
Glucose-Capillary: 83 mg/dL (ref 70–99)
Glucose-Capillary: 57 mg/dL — ABNORMAL LOW (ref 70–99)
Glucose-Capillary: 120 mg/dL — ABNORMAL HIGH (ref 70–99)
Glucose-Capillary: 133 mg/dL — ABNORMAL HIGH (ref 70–99)
Glucose-Capillary: 180 mg/dL — ABNORMAL HIGH (ref 70–99)

## 2012-02-21 LAB — COMPREHENSIVE METABOLIC PANEL
ALT: 9 U/L (ref 0–35)
BUN: 13 mg/dL (ref 6–23)
CO2: 25 mEq/L (ref 19–32)
Calcium: 9 mg/dL (ref 8.4–10.5)
Creatinine, Ser: 0.65 mg/dL (ref 0.50–1.10)
GFR calc Af Amer: >90 mL/min (ref >90)
GFR calc non Af Amer: 87 mL/min — ABNORMAL LOW (ref >90)
Glucose, Bld: 64 mg/dL — ABNORMAL LOW (ref 70–99)
Sodium: 143 mEq/L (ref 135–145)
Total Protein: 6.7 g/dL (ref 6.0–8.3)

## 2012-02-21 LAB — CBC
MCHC: 33.3 g/dL (ref 30.0–36.0)
WBC: 6.5 10*3/uL (ref 4.0–10.5)

## 2012-02-21 MED ORDER — ENSURE COMPLETE PO LIQD
237.0000 mL | ORAL | Status: DC
  Administered 2012-02-21 – 2012-02-25 (×4): 237 mL via ORAL

## 2012-02-21 MED ORDER — INSULIN ASPART PROT & ASPART (70-30 MIX) 100 UNIT/ML ~~LOC~~ SUSP
17.0000 [IU] | Freq: Every day | SUBCUTANEOUS | Status: DC
  Administered 2012-02-21 – 2012-02-29 (×8): 17 [IU] via SUBCUTANEOUS

## 2012-02-21 NOTE — Progress Notes (Signed)
Physical Therapy Note  Patient Details  Name: Hannah Lamb MRN: [id number] Date of Birth: Aug 08, 1941 Today's Date: 02/21/2012  Time: 1445-1518 33 minutes  No c/o pain.  Pt session focused on NMR for standing balance with reaching, tossing game.  Pt required mod A to gain balance initially on standing due to L and posterior lean.  Pt min A with static standing balance, mod A for balance reaching out of BOS.  Gait with RW 35' with min A, manual facilitation at hips to encourage increased step/stride length bilaterally.  Pt with good motivation to participate.  Individual therapy   Frimet Durfee 02/21/2012, 3:18 PM

## 2012-02-21 NOTE — Evaluation (Signed)
Occupational Therapy Assessment and Plan  Patient Details  Name: Hannah Lamb MRN: [id number] Date of Birth: [birth year]  OT Diagnosis: muscle weakness (generalized) Rehab Potential: Rehab Potential: Good ELOS: 10-14 days   Today's Date: 02/21/2012 Time: 0800-0908 Time Calculation (min): 68 min  Problem List:  Patient Active Problem List  Diagnoses  . DIABETES MELLITUS II, UNCOMPLICATED  . HYPERCHOLESTEROLEMIA  . HYPERTENSION, BENIGN SYSTEMIC  . ARTHRITIS  . Weakness generalized  . Hypertensive emergency  . Multiple sclerosis  . Abnormal EKG  . CVA (cerebral infarction)    Past Medical History:  Past Medical History  Diagnosis Date  . Hypertension   . MS (multiple sclerosis)   . Diabetes mellitus   . Goiter   . Hyperlipidemia    Past Surgical History:  Past Surgical History  Procedure Date  . Abdominal hysterectomy     Assessment & Plan Clinical Impression: Patient is a 71 y.o. year old female with recent admission to the hospital on 02/15/2012 with generalized weakness to the point she was unable to ambulate. MRI of the brain revealed acute infarct involving the right caudate head and anterior aspect of the right lenticular nucleus with smaller scattered infarcts throughout the right frontal lobe. There are also remote small infarcts of the basal ganglia, thalami and cerebellum bilaterally with prominent white matter type changes fairly confluent in the periventricular region that have not progressed since prior examination. MRI cervical spine with no focal cord signal abnormalities.  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   Pmhx is significant for MS, 2008; patient reports symptoms of generalized weakness with difficulty walking, although without history of falls.  Patient transferred to CIR on 02/20/2012 .    Patient  currently requires max assist with basic self-care skills secondary to decreased sitting balance, decreased standing balance and decreased balance strategies.  Prior to hospitalization, patient could complete BADL with supervision.  Patient will benefit from skilled intervention to increase independence with basic self-care skills prior to discharge home with care daughter and additional family assist.  Anticipate patient will require minimal physical assistance and follow up home health.  OT - End of Session Activity Tolerance: Tolerates 30+ min activity with multiple rests OT Assessment Rehab Potential: Good Barriers to Discharge: None OT Plan OT Frequency: 1-2 X/day, 60-90 minutes Estimated Length of Stay: 10-14 days OT Treatment/Interventions: Balance/vestibular training;DME/adaptive equipment instruction;Functional mobility training;Patient/family education;Self Care/advanced ADL retraining;Therapeutic Activities;Therapeutic Exercise;UE/LE Strength taining/ROM;UE/LE Coordination activities;Wheelchair propulsion/positioning OT Recommendation Follow Up Recommendations: Home health OT Equipment Recommended: 3 in 1 bedside comode;Wheelchair cushion (measurements)  OT Evaluation Precautions/Restrictions  Precautions Precautions: Fall  General Chart Reviewed: Yes Family/Caregiver Present: Yes (2 dtrs, grandaughter)  Vital Signs Therapy Vitals Temp: 98.4 F (36.9 C) Temp src: Oral Pulse Rate: 63  Resp: 18  BP: 160/70 mmHg Patient Position, if appropriate: Lying Oxygen Therapy SpO2: 98 % O2 Device: None (Room air)  Pain Pain Assessment Pain Assessment: No/denies pain Pain Score: 0-No pain  Home Living/Prior Functioning Home Living Lives With: Daughter Available Help at Discharge: Family Type of Home: Apartment Home Access: Level entry Home Layout: One level Bathroom Shower/Tub: Engineer, manufacturing systems: Standard Bathroom Accessibility: Yes How Accessible:  Accessible via walker Home Adaptive Equipment: Tub transfer bench;Walker - rolling IADL History Homemaking Responsibilities: Yes Meal Prep Responsibility: Primary Laundry Responsibility: Primary Cleaning Responsibility: Secondary Bill Paying/Finance Responsibility: Secondary Shopping Responsibility: No Child Care  Responsibility: No Homemaking Comments: light housekeeping Current License: No Mode of Transportation: Family Education: 10th grade Occupation: Retired 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 Leisure and Hobbies: fishing, gardening,  Prior Function Level of Independence: Independent with basic ADLs Able to Take Stairs?: No Driving: No Vocation: Retired  ADL ADL Grooming: Setup Where Assessed-Grooming: Sitting at sink;Wheelchair Upper Body Bathing: Moderate assistance Where Assessed-Upper Body Bathing: Sitting at sink;Wheelchair Lower Body Bathing: Maximal assistance Where Assessed-Lower Body Bathing: Sitting at sink;Wheelchair Upper Body Dressing: Moderate assistance Where Assessed-Upper Body Dressing: Sitting at sink;Wheelchair Lower Body Dressing: Dependent Where Assessed-Lower Body Dressing: Bed level Toileting: Not assessed Toilet Transfer: Not assessed Toilet Transfer Method: Not assessed Tub/Shower Transfer: Not assessed Psychologist, counselling Transfer: Not assessed  Vision/Perception  Vision - History Baseline Vision: Wears glasses only for reading Patient Visual Report: Blurring of vision Vision - Assessment Eye Alignment: Within Functional Limits Perception Perception: Within Functional Limits Praxis Praxis: Impaired Praxis Impairment Details: Motor planning   Cognition Overall Cognitive Status: Impaired Arousal/Alertness: Awake/alert Orientation Level: Oriented X4 Attention: Sustained Sustained Attention: Appears intact Executive Function: Reasoning Reasoning: Appears intact Safety/Judgment: Impaired  Sensation Sensation Light Touch: Appears  Intact Stereognosis: Appears Intact Hot/Cold: Appears Intact Proprioception: Appears Intact Coordination Gross Motor Movements are Fluid and Coordinated: Yes Fine Motor Movements are Fluid and Coordinated: Yes  Motor  Motor Motor: Within Functional Limits  Mobility  Bed Mobility Bed Mobility: Rolling Right Rolling Right: 3: Mod assist Rolling Right Details: Verbal cues for safe use of DME/AE;Manual facilitation for placement Supine to Sit: 3: Mod assist Supine to Sit: Patient Percentage: 50% Supine to Sit Details: Manual facilitation for placement;Verbal cues for safe use of DME/AE Transfers Sit to Stand: 2: Max assist Sit to Stand Details: Manual facilitation for weight shifting;Verbal cues for safe use of DME/AE Stand to Sit: 2: Max assist Stand to Sit Details (indicate cue type and reason): Manual facilitation for weight shifting;Verbal cues for safe use of DME/AE   Trunk/Postural Assessment  Postural Control Postural Control: Deficits on evaluation: posterior instability/weakness, leans to the left.  Balance Balance Balance Assessed: No Static Sitting Balance Static Sitting - Balance Support: Left upper extremity supported;Feet unsupported Static Sitting - Level of Assistance: 4: Min assist  Extremity/Trunk Assessment RUE Assessment RUE Assessment: Exceptions to [facility] (grossly impaired d/t RTC injury, 12/2010) LUE Assessment LUE Assessment: Within Functional Limits  See FIM for current functional status Refer to Care Plan for Long Term Goals  Recommendations for other services: None  Discharge Criteria: Patient will be discharged from OT if patient refuses treatment 3 consecutive times without medical reason, if treatment goals not met, if there is a change in medical status, if patient makes no progress towards goals or if patient is discharged from hospital.  The above assessment, treatment plan, treatment alternatives and goals were discussed and mutually agreed  upon: by patient and by family  Georgeanne Nim 02/21/2012, 10:39 AM

## 2012-02-21 NOTE — Evaluation (Signed)
Physical Therapy Assessment and Plan  Patient Details  Name: Hannah Lamb MRN: [id number] Date of Birth: [birth year]  PT Diagnosis: Abnormal posture, Abnormality of gait and Muscle weakness Rehab Potential: Good ELOS: 2 weeks   Today's Date: 02/21/2012 Time: 4098-1191 60 minutes  Problem List:  Patient Active Problem List  Diagnoses  . DIABETES MELLITUS II, UNCOMPLICATED  . HYPERCHOLESTEROLEMIA  . HYPERTENSION, BENIGN SYSTEMIC  . ARTHRITIS  . Weakness generalized  . Hypertensive emergency  . Multiple sclerosis  . Abnormal EKG  . CVA (cerebral infarction)    Past Medical History:  Past Medical History  Diagnosis Date  . Hypertension   . MS (multiple sclerosis)   . Diabetes mellitus   . Goiter   . Hyperlipidemia    Past Surgical History:  Past Surgical History  Procedure Date  . Abdominal hysterectomy     Assessment & Plan Clinical Impression: Patient is a 71 y.o. year old female with recent admission to the hospital on 02/15/2012 with generalized weakness to the point she was unable to ambulate. MRI of the brain revealed acute infarct involving the right caudate head and anterior aspect of the right lenticular nucleus with smaller scattered infarcts throughout the right frontal lobe. There are also remote small infarcts of the basal ganglia, thalami and cerebellum bilaterally with prominent white matter type changes fairly confluent in the periventricular region that have not progressed since prior examination. MRI cervical spine with no focal cord signal abnormalities.  Patient transferred to CIR on 02/20/2012 .   Patient currently requires mod with mobility secondary to muscle weakness and abnormal tone and unbalanced muscle activation.  Prior to hospitalization, patient was mod I with mobility and lived with Daughter in a Apartment home.  Home access is  Level entry.  Patient will benefit from skilled PT intervention to maximize safe functional mobility, minimize fall  risk and decrease caregiver burden for planned discharge home with 24 hour supervision.  Anticipate patient will benefit from follow up HH at discharge.  PT - End of Session Activity Tolerance: Tolerates 30+ min activity with multiple rests PT Assessment Rehab Potential: Good PT Plan PT Frequency: 1-2 X/day, 60-90 minutes Estimated Length of Stay: 2 weeks PT Treatment/Interventions: Ambulation/gait training;Balance/vestibular training;DME/adaptive equipment instruction;Discharge planning;Functional mobility training;Therapeutic Activities;UE/LE Coordination activities;Wheelchair propulsion/positioning;UE/LE Strength taining/ROM;Stair training;Patient/family education;Pain management;Neuromuscular re-education;Therapeutic Exercise;Community reintegration PT Recommendation Follow Up Recommendations: Home health PT Equipment Recommended: Rolling walker with 5" wheels  PT Evaluation Precautions/Restrictions Precautions Precautions: Fall Restrictions Weight Bearing Restrictions: No Pain Pain Assessment Pain Assessment: No/denies pain Home Living/Prior Functioning Home Living Lives With: Daughter Available Help at Discharge: Family Type of Home: Apartment Home Access: Level entry Home Layout: One level Home Adaptive Equipment: Environmental consultant - four wheeled;Wheelchair - powered Prior Function Level of Independence: Requires assistive device for independence (rollator) Able to Take Stairs?: No Driving: No Vocation: Retired  IT consultant Overall Cognitive Status: Appears within functional limits for tasks assessed  Sensation Sensation Light Touch: Appears Intact Proprioception: Appears Intact Coordination Gross Motor Movements are Fluid and Coordinated: Yes Motor  Motor Motor: Abnormal postural alignment and control;Abnormal tone Motor - Skilled Clinical Observations: posterior lean in standing, L lateral lean in sitting/standing, decreased L trunk tone  Mobility Bed Mobility Supine  to Sit: 4: Min assist Supine to Sit Details (indicate cue type and reason): cues for technique, increased time, assist for sliding L LE in bed Transfers Squat Pivot Transfers: 2: Max Designer, television/film set Transfer Details (indicate cue type and reason): cues for wt shifting, leaning  forward.  cues for hand placement.  lifting assistance at hips Locomotion  Ambulation Ambulation/Gait Assistance: 3: Mod assist Ambulation Distance (Feet): 10 Feet Assistive device: Rolling walker Ambulation/Gait Assistance Details: manual facilitation at hips for wt shift, assist for RW control, cues for posture, tactile cues at trunk to promote extension, manual facilitaiton to correct posterior and L lean Stairs / Additional Locomotion Stairs: Yes Stairs Assistance: 3: Mod assist Stairs Assistance Details (indicate cue type and reason): cues for foot placement, lifting assist at hips Stair Management Technique: Two rails Number of Stairs: 3  Wheelchair Mobility Wheelchair Mobility:  (deferred due to R shoulder injury)  Trunk/Postural Assessment  Cervical Assessment Cervical Assessment: Within Functional Limits Thoracic Assessment Thoracic Assessment:  (L lean, decreased mm tone) Lumbar Assessment Lumbar Assessment:  (posterior tilt) Postural Control Postural Control: Deficits on evaluation Trunk Control: overall decreased strength, decreased mm tone on L, L lean  Balance Static Sitting Balance Static Sitting - Balance Support: Feet supported Static Sitting - Level of Assistance: 5: Stand by assistance Static Standing Balance Static Standing - Balance Support: During functional activity;Right upper extremity supported Static Standing - Level of Assistance: 4: Min assist Dynamic Standing Balance Dynamic Standing - Level of Assistance: 2: Max assist (for functional task) Extremity Assessment      RLE Assessment RLE Assessment:  (grossly 3/5, ankle DF to neutral) LLE Assessment LLE Assessment:   (grossly 3-/5, ankle DF to neutral)  See FIM for current functional status Refer to Care Plan for Long Term Goals  Recommendations for other services: None  Discharge Criteria: Patient will be discharged from PT if patient refuses treatment 3 consecutive times without medical reason, if treatment goals not met, if there is a change in medical status, if patient makes no progress towards goals or if patient is discharged from hospital.  The above assessment, treatment plan, treatment alternatives and goals were discussed and mutually agreed upon: by patient and by family  Treatment initiated during session: Stand pivot training with RW with mod A.  Pt requires cues for safety but follows cues without assist.  Manual facilitation at hips for wt shift and to increased step length during SPT.  Gait with RW 25' with min-mod A with cues to widen BOS, increase step length, assist for RW control.  Pt with good motivation to participate in therapy.  Gordy Goar 02/21/2012, 3:34 PM

## 2012-02-21 NOTE — Progress Notes (Signed)
Social Work Assessment and Plan Social Work Assessment and Plan  Patient Details  Name: Hannah Lamb MRN: [id number] Date of Birth: 04-01-1941  Today's Date: 02/21/2012  Problem List:  Patient Active Problem List  Diagnoses  . DIABETES MELLITUS II, UNCOMPLICATED  . HYPERCHOLESTEROLEMIA  . HYPERTENSION, BENIGN SYSTEMIC  . ARTHRITIS  . Weakness generalized  . Hypertensive emergency  . Multiple sclerosis  . Abnormal EKG  . CVA (cerebral infarction)   Past Medical History:  Past Medical History  Diagnosis Date  . Hypertension   . MS (multiple sclerosis)   . Diabetes mellitus   . Goiter   . Hyperlipidemia    Past Surgical History:  Past Surgical History  Procedure Date  . Abdominal hysterectomy    Social History:  reports that she has never smoked. She does not have any smokeless tobacco history on file. She reports that she does not drink alcohol or use illicit drugs.  Family / Support Systems Marital Status: Widow/Widower Patient Roles: Parent Children: [name]-daughter  [phone] Other Supports: Mamie Mallet-daughter Anticipated Caregiver: Daughter's Ability/Limitations of Caregiver: Pt currently lives with [name] and she can provide assistance Caregiver Availability: 24/7 Family Dynamics: Large family, pt had seven children.  All are supportive and involved in her care.  She reports they are always here for me.  Social History Preferred language: English Religion: Jehovah's Witness Cultural Background: Jehovah Witness Education: High School Read: Yes Write: Yes Employment Status: Retired Fish farm manager Issues: No issues Guardian/Conservator: None   Abuse/Neglect Physical Abuse: Denies Verbal Abuse: Denies Sexual Abuse: Denies Exploitation of patient/patient's resources: Denies Self-Neglect: Denies  Emotional Status Pt's affect, behavior adn adjustment status: Pt is motivated to improve and get better.  She has  some concerns regarding not feeling balanced when she stands and this makes her nervous.  She has always been able to be independent and wants to do all she can for herself. Her daughters are here and very supportive of her. Recent Psychosocial Issues: other medical issues Pyschiatric History: No history- Deferred Depression Screen due to pt tired and not wanting to do at this time.  Will try at another time.  Daughter's feel she is doing well with this and coping well.  Will monitor her coping while here. Substance Abuse History: No issues  Patient / Family Perceptions, Expectations & Goals Pt/Family understanding of illness & functional limitations: Pt and daughter's have a good understanding of her condition and deficits. Daughter's stay here with her and participate in her care.  Discussed need to observe and take it all in and not try to do for their mother.  They are doing this today. Premorbid pt/family roles/activities: Mother, grandmother, retiree, Church member, etc Anticipated changes in roles/activities/participation: resume at discharge Pt/family expectations/goals: Pt states: " I hope to get back on my feet and feel better and not so unbalanced."  Daughter states: " We hope she can get back to wheer she was, before this."  [employer]: None Premorbid Home Care/DME Agencies: None Transportation available at discharge: [employer] referrals recommended: Support group (specify) (CVA And MS Support group)  Discharge Planning Living Arrangements: Children Support Systems: Children;Friends/neighbors;Church/faith community Type of Residence: Private residence Insurance Resources: Medicare;Medicaid (specify county) Medical sales representative) Surveyor, quantity Resources: Restaurant manager, fast food Screen Referred: No Living Expenses: Lives with family Money Management: Family Do you have any problems obtaining your medications?: No Home Management: Family Patient/Family  Preliminary Plans: Return home with daughter's assisting if necessary.  All hope she  does well here and regains her independence.  Daughter's stay with her while she is in the hospital. Social Work Anticipated Follow Up Needs: HH/OP;Support Group  Clinical Impression Pleasant female who is surrounded by family.  Pt seems to express her concerns and apprehensions.  Daughter's here and trying to dictate her care, but need to allow the therapists to evaluate and Set a treatment plan.  Aware pt may require 24 hour care at discharge, daughter's report no problem.  Lucy Chris 02/21/2012, 2:06 PM

## 2012-02-21 NOTE — Progress Notes (Signed)
CBG: 0730  Treatment: 15 GM carbohydrate snack  Symptoms: None  Follow-up CBG: Time:0755 CBG Result:83  Possible Reasons for Event: Unknown  Comments/MD notified: Informed Dr. Doroteo Bradford, hold sliding scale novolog and give 20 units of 70/30.    Hannah Lamb

## 2012-02-21 NOTE — Progress Notes (Signed)
CBG: 67  Treatment: 15 GM carbohydrate snack, finished lunch tray, juice  Symptoms: None  Follow-up CBG: Time:1318 CBG Result:120  Possible Reasons for Event: Unknown  Comments/MD notified: Hannah Ricks, PA made aware.  No new order.    Hannah Lamb

## 2012-02-21 NOTE — Progress Notes (Signed)
CBG: 56  Treatment: 15 GM carbohydrate snack  Symptoms: None  Follow-up CBG: Time:1202 CBG Result:57  Possible Reasons for Event: Unknown  Comments/MD notified: Jesusita Oka, PA informed. No new orders, Dr. Doroteo Bradford decrease 70/30 for supper time from 20 units to 17 units.    Jerimy Johanson

## 2012-02-21 NOTE — Progress Notes (Signed)
  CBG: 57  Treatment: 15 GM carbohydrate snack, milk, crackers  Symptoms: None  Follow-up CBG: Time:1220 CBG Result:67  Possible Reasons for Event: Unknown  Comments/MD notified:  Dan Finland, PA informed, no new order.    Kalisa Girtman

## 2012-02-21 NOTE — Progress Notes (Signed)
Patient ID: Hannah Lamb, female   DOB: 06/05/1941, 71 y.o.   MRN: [id number]  Subjective/Complaints: Hannah Lamb is a 71 y.o. right-handed female with history of multiple sclerosis diagnosed 2008 and followed at [facility] on no present medications for MS and hypertension admitted 02/15/2012 with generalized weakness to the point she was unable to ambulate. MRI of the brain revealed acute infarct involving the right caudate head and anterior aspect of the right lenticular nucleus with smaller scattered infarcts throughout the right frontal lobe. There are also remote small infarcts of the basal ganglia, thalami and cerebellum bilaterally with prominent white matter type changes fairly confluent in the periventricular region (MS vs small vessel disease) that have not progressed since prior examination. MRI cervical spine with no focal cord signal abnormalities. Neurology services consulted and placed on Plavix therapy for CVA prophylaxis. Shooting pain under L breast last noc none currently Review of Systems  Constitutional: Negative for fever and chills.  Respiratory: Negative for cough, sputum production and shortness of breath.   Cardiovascular: Positive for chest pain.       L lower rib pain last noc  Gastrointestinal: Positive for abdominal pain.  Musculoskeletal: Positive for joint pain.       R shoulder  All other systems reviewed and are negative.   Objective: Vital Signs: Blood pressure 155/72, pulse 61, temperature 98.2 F (36.8 C), temperature source Oral, resp. rate 18, height 5\' 7"  (1.702 m), weight 88.2 kg (194 lb 7.1 oz), SpO2 95.00%. No results found. Results for orders placed during the hospital encounter of 02/20/12 (from the past 72 hour(s))  GLUCOSE, CAPILLARY     Status: Abnormal   Collection Time   02/20/12  5:02 PM      Component Value Range Comment   Glucose-Capillary 182 (*) 70 - 99 (mg/dL)   GLUCOSE, CAPILLARY     Status: Abnormal   Collection Time   02/20/12  8:41 PM      Component Value Range Comment   Glucose-Capillary 232 (*) 70 - 99 (mg/dL)   CBC     Status: Normal   Collection Time   02/21/12  6:30 AM      Component Value Range Comment   WBC 6.5  4.0 - 10.5 (K/uL)    RBC 4.34  3.87 - 5.11 (MIL/uL)    Hemoglobin 12.3  12.0 - 15.0 (g/dL)    HCT 40.9  81.1 - 91.4 (%)    MCV 85.0  78.0 - 100.0 (fL)    MCH 28.3  26.0 - 34.0 (pg)    MCHC 33.3  30.0 - 36.0 (g/dL)    RDW 78.2  95.6 - 21.3 (%)    Platelets 151  150 - 400 (K/uL)   COMPREHENSIVE METABOLIC PANEL     Status: Abnormal   Collection Time   02/21/12  6:30 AM      Component Value Range Comment   Sodium 143  135 - 145 (mEq/L)    Potassium 3.7  3.5 - 5.1 (mEq/L)    Chloride 106  96 - 112 (mEq/L)    CO2 25  19 - 32 (mEq/L)    Glucose, Bld 64 (*) 70 - 99 (mg/dL)    BUN 13  6 - 23 (mg/dL)    Creatinine, Ser 0.86  0.50 - 1.10 (mg/dL)    Calcium 9.0  8.4 - 10.5 (mg/dL)    Total Protein 6.7  6.0 - 8.3 (g/dL)    Albumin 2.9 (*) 3.5 - 5.2 (  g/dL)    AST 16  0 - 37 (U/L)    ALT 9  0 - 35 (U/L)    Alkaline Phosphatase 47  39 - 117 (U/L)    Total Bilirubin 0.5  0.3 - 1.2 (mg/dL)    GFR calc non Af Amer 87 (*) >90 (mL/min)    GFR calc Af Amer >90  >90 (mL/min)   DIFFERENTIAL     Status: Abnormal   Collection Time   02/21/12  6:30 AM      Component Value Range Comment   Neutrophils Relative 38 (*) 43 - 77 (%)    Neutro Abs 2.5  1.7 - 7.7 (K/uL)    Lymphocytes Relative 50 (*) 12 - 46 (%)    Lymphs Abs 3.2  0.7 - 4.0 (K/uL)    Monocytes Relative 9  3 - 12 (%)    Monocytes Absolute 0.6  0.1 - 1.0 (K/uL)    Eosinophils Relative 4  0 - 5 (%)    Eosinophils Absolute 0.2  0.0 - 0.7 (K/uL)    Basophils Relative 0  0 - 1 (%)    Basophils Absolute 0.0  0.0 - 0.1 (K/uL)   GLUCOSE, CAPILLARY     Status: Abnormal   Collection Time   02/21/12  7:33 AM      Component Value Range Comment   Glucose-Capillary 65 (*) 70 - 99 (mg/dL)    Comment 1 Notify RN     GLUCOSE, CAPILLARY     Status:  Normal   Collection Time   02/21/12  7:55 AM      Component Value Range Comment   Glucose-Capillary 83  70 - 99 (mg/dL)    Comment 1 Notify RN       HEENT: normal Cardio: RRR Resp: CTA B/L GI: BS positive Extremity:  No Edema Skin:   Intact Neuro: Alert/Oriented, Abnormal Sensory reduced sensation to lt touch and Abnormal Motor reduced strenth on MMT Musc/Skel:  Other positive impingement R shoulder   Assessment/Plan: 1. Functional deficits secondary to R basal ganglia infarct which require 3+ hours per day of interdisciplinary therapy in a comprehensive inpatient rehab setting. Physiatrist is providing close team supervision and 24 hour management of active medical problems listed below. Physiatrist and rehab team continue to assess barriers to discharge/monitor patient progress toward functional and medical goals. FIM:                   Comprehension Comprehension Mode: Auditory Comprehension: 6-Follows complex conversation/direction: With extra time/assistive device  Expression Expression Mode: Verbal Expression: 6-Expresses complex ideas: With extra time/assistive device  Social Interaction Social Interaction: 6-Interacts appropriately with others with medication or extra time (anti-anxiety, antidepressant).  Problem Solving Problem Solving: 6-Solves complex problems: With extra time  Memory Memory: 6-More than reasonable amt of time  Medical Problem List and Plan:  1. 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 DVT Prophylaxis/Anticoagulation: SCDs. Monitor for any signs of DVT  3. multiple sclerosis. MRI and C-spine showed no new lesions. Patient on no medication for MS prior to hospital admission  4. Diabetes mellitus. Hemoglobin A1c 10.5. Insulin aspart (novolog) 3 units 3 times a day and NovoLog 7030 22 units at breakfast 20 units supper.reduce pm 70/30  5.  Hypertension. bystolic 10 mg daily. Monitor with increased mobility  6. Hyperlipidemia. Lipitor  7. Hypokalemia. Followup labs  8. Thyroid mass. Plan is to followup  with Dr. Suszanne Conners for biopsy as an outpatient. Patient would need to be off Plavix x7 days prior to procedure  9. Right rotator cuff disease probable tear, OT for range of motion and scapular stabilization  LOS (Days) 1 A FACE TO FACE EVALUATION WAS PERFORMED  Damere Brandenburg E 02/21/2012, 8:45 AM

## 2012-02-21 NOTE — Progress Notes (Signed)
Inpatient Diabetes Program Recommendations  AACE/ADA: New Consensus Statement on Inpatient Glycemic Control (2009)  Target Ranges:  Prepandial:   less than 140 mg/dL      Peak postprandial:   less than 180 mg/dL (1-2 hours)      Critically ill patients:  140 - 180 mg/dL   Reason for Visit: Hypoglycemia *70/30 pm insulin dose ordered to be given at supper was actually given at 2113 rather than at supper time (1700-1800).  Consequently glucose levels were low in early am into mid-morning.  Agree that a 2 unit decrease would be helpful, but the majority of the problem lies with when it was actually given.  Will follow.   Inpatient Diabetes Program Recommendations Insulin - Basal: Noted pm 70/30 entered to be given at HS rather than ac supper as normally administered.  Noted low glucoseduring the early am hours, most probably due to 70/30 (Incorrect..see above *Note)  Note: Thank you, Lenor Coffin, RN, CNS, Diabetes Coordinator [phone])

## 2012-02-21 NOTE — Progress Notes (Addendum)
INITIAL ADULT NUTRITION ASSESSMENT Date: 02/21/2012   Time: 1:01 PM  Reason for Assessment: Nutrition Risk Report  ASSESSMENT: Female 71 y.o.  Dx: CVA (cerebral infarction)  Hx:  Past Medical History  Diagnosis Date  . Hypertension   . MS (multiple sclerosis)   . Diabetes mellitus   . Goiter   . Hyperlipidemia    Past Surgical History  Procedure Date  . Abdominal hysterectomy    Related Meds:     . clopidogrel  75 mg Oral Q breakfast  . insulin aspart  0-8 Units Subcutaneous TID WC  . insulin aspart protamine-insulin aspart  17 Units Subcutaneous Q supper  . insulin aspart protamine-insulin aspart  22 Units Subcutaneous Q breakfast  . nebivolol  10 mg Oral Daily  . potassium chloride  20 mEq Oral Daily  . DISCONTD: insulin aspart protamine-insulin aspart  20 Units Subcutaneous Q supper   Ht: 5\' 7"  (170.2 cm)  Wt: 194 lb 7.1 oz (88.2 kg)  Ideal Wt: 61.4 kg % Ideal Wt: 144%  Wt Readings from Last 15 Encounters:  02/20/12 194 lb 7.1 oz (88.2 kg)  02/20/12 198 lb 10.2 oz (90.1 kg)  Usual Wt: pt unable to report % Usual Wt: n/a  Body mass index is 30.45 kg/(m^2). Obese Class I.  Food/Nutrition Related Hx: Regular diet PTA  Labs:  CMP     Component Value Date/Time   NA 143 02/21/2012 0630   K 3.7 02/21/2012 0630   CL 106 02/21/2012 0630   CO2 25 02/21/2012 0630   GLUCOSE 64* 02/21/2012 0630   BUN 13 02/21/2012 0630   CREATININE 0.65 02/21/2012 0630   CALCIUM 9.0 02/21/2012 0630   PROT 6.7 02/21/2012 0630   ALBUMIN 2.9* 02/21/2012 0630   AST 16 02/21/2012 0630   ALT 9 02/21/2012 0630   ALKPHOS 47 02/21/2012 0630   BILITOT 0.5 02/21/2012 0630   GFRNONAA 87* 02/21/2012 0630   GFRAA >90 02/21/2012 0630   Lab Results  Component Value Date   HGBA1C 10.5* 02/16/2012   CBG (last 3)   Basename 02/21/12 1318 02/21/12 1220 02/21/12 1202  GLUCAP 120* 67* 57*     Intake/Output Summary (Last 24 hours) at 02/21/12 1435 Last data filed at 02/21/12 1300  Gross per 24 hour   Intake   1080 ml  Output      2 ml  Net   1078 ml    Diet Order: Carb Control Medium 1600 - 2000  Supplements/Tube Feeding: none  IVF:    Estimated Nutritional Needs:   Kcal: 1750 -1950 kcal Protein:  62 - 74 grams protein Fluid:  1.9 - 2 liters daily  Hx of MS. RD drawn to chart for pt's report with Chewing or Swallowing with Nutrition Risk Report. Pt is eating well on current diet, pt did not report any issues with chewing or swallowing at this time. Eating 100% of meals at this time. Family is interested in providing pt with Ensure Complete PO daily for additional protein and kcal. No skin breakdown at this time.  Family reports that pt eats balanced meals regularly throughout the day.  RD saw pt on 6/5 for Heart Healthy Diabetic Healthy Eating. Continue to reinforce education as needed.  NUTRITION DIAGNOSIS: -Increased nutrient needs (NI-5.1).  Status: Ongoing  RELATED TO: participation in rehab therapies  AS EVIDENCE BY: estimated needs  MONITORING/EVALUATION(Goals): Goal: Pt to consume at least 75% of meals and supplements Monitor: weights, labs, PO intake, I/O's  EDUCATION NEEDS: -Education not  appropriate at this time  INTERVENTION: 1. Add Ensure Complete PO daily for additional protein and kcal 2. RD to continue to follow nutrition care plan  Dietitian #: [phone]  DOCUMENTATION CODES Per approved criteria  -Obesity Unspecified    Adair Laundry 02/21/2012, 1:01 PM

## 2012-02-21 NOTE — Care Management (Signed)
Inpatient Rehabilitation Center Individual Statement of Services  Patient Name:  Hannah Lamb  Date:  02/21/2012  Welcome to the Inpatient Rehabilitation Center.  Our goal is to provide you with an individualized program based on your diagnosis and situation, designed to meet your specific needs.  With this comprehensive rehabilitation program, you will be expected to participate in at least 3 hours of rehabilitation therapies Monday-Friday, with modified therapy programming on the weekends.  Your rehabilitation program will include the following services:  Physical Therapy (PT), Occupational Therapy (OT), Speech Therapy (ST), 24 hour per day rehabilitation nursing, Therapeutic Recreaction (TR), Case Management (RN and Child psychotherapist), Rehabilitation Medicine, Nutrition Services and Pharmacy Services  Weekly team conferences will be held on  Wednesday  to discuss your progress.  Your RN Case Designer, television/film set will talk with you frequently to get your input and to update you on team discussions.  Team conferences with you and your family in attendance may also be held.  Expected length of stay: 10-14 days  Overall anticipated outcome: Supervision-Min Assist  Depending on your progress and recovery, your program may change.  Your RN Case Estate agent will coordinate services and will keep you informed of any changes.  Your RN Sports coach and SW names and contact numbers are listed  below.  The following services may also be recommended but are not provided by the Inpatient Rehabilitation Center:   Driving Evaluations  Home Health Rehabiltiation Services  Outpatient Rehabilitatation [facility]  Vocational Rehabilitation   Arrangements will be made to provide these services after discharge if needed.  Arrangements include referral to agencies that provide these services.  Your insurance has been verified to be:  Medicare + Medicaid Your primary doctor is:    Pertinent  information will be shared with your doctor and your insurance company.  Case Manager: Melanee Spry, [facility] [phone]  Social Worker:  Dossie Der, [place] [phone]  Information discussed with and copy given to patient by: Brock Ra, 02/21/2012, 5:37 PM

## 2012-02-21 NOTE — Progress Notes (Signed)
Patient information reviewed and entered into [facility] system by Dishon Kehoe, RN, CRRN, PPS Coordinator.  Information including medical coding and functional independence measure will be reviewed and updated through discharge.     Per nursing patient was given "Data Collection Information Summary for Patients in Inpatient Rehabilitation Facilities with attached "Privacy Act Statement-Health Care Records" upon admission.   

## 2012-02-21 NOTE — Evaluation (Signed)
Speech Language Pathology Assessment and Plan  Patient Details  Name: Hannah Lamb MRN: [id number] Date of Birth: [birth year]  SLP Diagnosis:    Rehab Potential:   ELOS:     Today's Date: 02/21/2012 Time: 4098-1191 Time Calculation (min): 60 min  Problem List:  Patient Active Problem List  Diagnoses  . DIABETES MELLITUS II, UNCOMPLICATED  . HYPERCHOLESTEROLEMIA  . HYPERTENSION, BENIGN SYSTEMIC  . ARTHRITIS  . Weakness generalized  . Hypertensive emergency  . Multiple sclerosis  . Abnormal EKG  . CVA (cerebral infarction)   Past Medical History:  Past Medical History  Diagnosis Date  . Hypertension   . MS (multiple sclerosis)   . Diabetes mellitus   . Goiter   . Hyperlipidemia    Past Surgical History:  Past Surgical History  Procedure Date  . Abdominal hysterectomy     Assessment / Plan / Recommendation Clinical Impression  71 y.o. right-handed female with history of multiple sclerosis diagnosed 2008 and followed at [facility] on no present medications for MS and hypertension admitted 02/15/2012 with generalized weakness to the point she was unable to ambulate. MRI of the brain revealed acute infarct involving the right caudate head and anterior aspect of the right lenticular nucleus with smaller scattered infarcts throughout the right frontal lobe. There are also remote small infarcts of the basal ganglia, thalami and cerebellum bilaterally with prominent white matter type changes fairly confluent in the periventricular region (MS vs small vessel disease) that have not progressed since prior examination.  *Patient presents with overall functional cognitive-linguistic abilities during this evaluation with functional attention, recall of new information, problem-solving and reasoning during a primarily verbal-based examination.  No evidence of a left field inattention/neglect.  Skilled observation of basic functional tasks revealed appropriate problem-solving and  awarness (e.g., use of call bell).  Aware of OT evaluation results of decreased safety awareness as well as decreased awareness of left leaning posture (motor/sensory issue) that likely has a cognitive component.  These functional impairments can be best addressed during PT/OT sessions.  Skilled SLP services are not warranted at this time.  Please consult as felt necessary.    SLP Assessment  Patient does not need any further Speech Lanaguage Pathology Services    Recommendations  Follow up Recommendations: None     Pain Pain Assessment Pain Assessment: No/denies pain Prior Functioning Cognitive/Linguistic Baseline: Within functional limits Type of Home: Apartment Lives With: Daughter Available Help at Discharge: Family Education: 10th grade Vocation: Retired  Teacher, music Term Goals: No short term goals set  See FIM for current functional status Refer to Care Plan for Long Term Goals  Recommendations for other services: None  The above assessment, treatment plan, treatment alternatives and goals were discussed and mutually agreed upon: by patient and by family  Myra Rude, M.S.,CCC-SLP Pager [phone] 02/21/2012, 1:40 PM

## 2012-02-22 LAB — GLUCOSE, CAPILLARY: Glucose-Capillary: 178 mg/dL — ABNORMAL HIGH (ref 70–99)

## 2012-02-22 MED ORDER — INSULIN ASPART PROT & ASPART (70-30 MIX) 100 UNIT/ML ~~LOC~~ SUSP
25.0000 [IU] | Freq: Every day | SUBCUTANEOUS | Status: DC
  Administered 2012-02-23 – 2012-02-24 (×2): 25 [IU] via SUBCUTANEOUS
  Filled 2012-02-22: qty 3

## 2012-02-22 NOTE — Progress Notes (Signed)
Physical Therapy Note  Patient Details  Name: Hannah Lamb MRN: [id number] Date of Birth: [birth year] Today's Date: 02/22/2012  11:30-12:00 individual therapy pt denied pain.  Pt's granddaughter requested exercises to perform in the room. HEP for chair and bed given and she performed with pt x 10-20 each including LAQ with 3# weight, seated march, and ABCs with ankles. Backrest was added to chair for postural support.    [name] 02/22/2012, 12:13 PM

## 2012-02-22 NOTE — Progress Notes (Signed)
Social Work Patient ID: Hannah Lamb, female   DOB: Aug 17, 1941, 71 y.o.   MRN: [id number] Met with pt, daughter and granddaughter to inform of team conference-goals-supervision level and  Discharge date of 6/24.  Daughter's are here daily and attending therapies with pt.  Pt states: " I think I am doing  Wonderfully."  She is very pleased with her progress and stay here.  Discussed DME and follow up needs. Continue To work on discharge plans.

## 2012-02-22 NOTE — Progress Notes (Signed)
Patient ID: Hannah Lamb, female   DOB: [birth year], 71 y.o.   MRN: [id number]  Subjective/Complaints: Hannah Lamb is a 71 y.o. right-handed female with history of multiple sclerosis diagnosed 2008 and followed at [facility] on no present medications for MS and hypertension admitted 02/15/2012 with generalized weakness to the point she was unable to ambulate. MRI of the brain revealed acute infarct involving the right caudate head and anterior aspect of the right lenticular nucleus with smaller scattered infarcts throughout the right frontal lobe. There are also remote small infarcts of the basal ganglia, thalami and cerebellum bilaterally with prominent white matter type changes fairly confluent in the periventricular region (MS vs small vessel disease) that have not progressed since prior examination. MRI cervical spine with no focal cord signal abnormalities. Neurology services consulted and placed on Plavix therapy for CVA prophylaxis.  No pains  Review of Systems  Constitutional: Negative for fever and chills.  Respiratory: Negative for cough, sputum production and shortness of breath.   Cardiovascular: Positive for chest pain.       L lower rib pain last noc  Gastrointestinal: Positive for abdominal pain.  Musculoskeletal: Positive for joint pain.       R shoulder  All other systems reviewed and are negative.   Objective: Vital Signs: Blood pressure 157/80, pulse 60, temperature 98.4 F (36.9 C), temperature source Oral, resp. rate 18, height 5\' 7"  (1.702 m), weight 88.2 kg (194 lb 7.1 oz), SpO2 98.00%. No results found. Results for orders placed during the hospital encounter of 02/20/12 (from the past 72 hour(s))  GLUCOSE, CAPILLARY     Status: Abnormal   Collection Time   02/20/12  5:02 PM      Component Value Range Comment   Glucose-Capillary 182 (*) 70 - 99 mg/dL   GLUCOSE, CAPILLARY     Status: Abnormal   Collection Time   02/20/12  8:41 PM      Component Value Range  Comment   Glucose-Capillary 232 (*) 70 - 99 mg/dL   CBC     Status: Normal   Collection Time   02/21/12  6:30 AM      Component Value Range Comment   WBC 6.5  4.0 - 10.5 K/uL    RBC 4.34  3.87 - 5.11 MIL/uL    Hemoglobin 12.3  12.0 - 15.0 g/dL    HCT 86.5  78.4 - 69.6 %    MCV 85.0  78.0 - 100.0 fL    MCH 28.3  26.0 - 34.0 pg    MCHC 33.3  30.0 - 36.0 g/dL    RDW 29.5  28.4 - 13.2 %    Platelets 151  150 - 400 K/uL   COMPREHENSIVE METABOLIC PANEL     Status: Abnormal   Collection Time   02/21/12  6:30 AM      Component Value Range Comment   Sodium 143  135 - 145 mEq/L    Potassium 3.7  3.5 - 5.1 mEq/L    Chloride 106  96 - 112 mEq/L    CO2 25  19 - 32 mEq/L    Glucose, Bld 64 (*) 70 - 99 mg/dL    BUN 13  6 - 23 mg/dL    Creatinine, Ser 4.40  0.50 - 1.10 mg/dL    Calcium 9.0  8.4 - 10.2 mg/dL    Total Protein 6.7  6.0 - 8.3 g/dL    Albumin 2.9 (*) 3.5 - 5.2 g/dL  AST 16  0 - 37 U/L    ALT 9  0 - 35 U/L    Alkaline Phosphatase 47  39 - 117 U/L    Total Bilirubin 0.5  0.3 - 1.2 mg/dL    GFR calc non Af Amer 87 (*) >90 mL/min    GFR calc Af Amer >90  >90 mL/min   DIFFERENTIAL     Status: Abnormal   Collection Time   02/21/12  6:30 AM      Component Value Range Comment   Neutrophils Relative 38 (*) 43 - 77 %    Neutro Abs 2.5  1.7 - 7.7 K/uL    Lymphocytes Relative 50 (*) 12 - 46 %    Lymphs Abs 3.2  0.7 - 4.0 K/uL    Monocytes Relative 9  3 - 12 %    Monocytes Absolute 0.6  0.1 - 1.0 K/uL    Eosinophils Relative 4  0 - 5 %    Eosinophils Absolute 0.2  0.0 - 0.7 K/uL    Basophils Relative 0  0 - 1 %    Basophils Absolute 0.0  0.0 - 0.1 K/uL   GLUCOSE, CAPILLARY     Status: Abnormal   Collection Time   02/21/12  7:33 AM      Component Value Range Comment   Glucose-Capillary 65 (*) 70 - 99 mg/dL    Comment 1 Notify RN     GLUCOSE, CAPILLARY     Status: Normal   Collection Time   02/21/12  7:55 AM      Component Value Range Comment   Glucose-Capillary 83  70 - 99 mg/dL     Comment 1 Notify RN     GLUCOSE, CAPILLARY     Status: Abnormal   Collection Time   02/21/12 11:37 AM      Component Value Range Comment   Glucose-Capillary 56 (*) 70 - 99 mg/dL    Comment 1 Notify RN     GLUCOSE, CAPILLARY     Status: Abnormal   Collection Time   02/21/12 12:02 PM      Component Value Range Comment   Glucose-Capillary 57 (*) 70 - 99 mg/dL    Comment 1 Notify RN     GLUCOSE, CAPILLARY     Status: Abnormal   Collection Time   02/21/12 12:20 PM      Component Value Range Comment   Glucose-Capillary 67 (*) 70 - 99 mg/dL    Comment 1 Notify RN     GLUCOSE, CAPILLARY     Status: Abnormal   Collection Time   02/21/12  1:18 PM      Component Value Range Comment   Glucose-Capillary 120 (*) 70 - 99 mg/dL    Comment 1 Notify RN     GLUCOSE, CAPILLARY     Status: Abnormal   Collection Time   02/21/12  4:52 PM      Component Value Range Comment   Glucose-Capillary 133 (*) 70 - 99 mg/dL   GLUCOSE, CAPILLARY     Status: Abnormal   Collection Time   02/21/12  9:33 PM      Component Value Range Comment   Glucose-Capillary 180 (*) 70 - 99 mg/dL   GLUCOSE, CAPILLARY     Status: Abnormal   Collection Time   02/22/12  7:30 AM      Component Value Range Comment   Glucose-Capillary 178 (*) 70 - 99 mg/dL    Comment 1 Notify RN  HEENT: normal Cardio: RRR Resp: CTA B/L GI: BS positive Extremity:  No Edema Skin:   Intact Neuro: Alert/Oriented, Abnormal Sensory reduced sensation to lt touch and Abnormal Motor reduced strenth on MMT Musc/Skel:  Other positive impingement R shoulder   Assessment/Plan: 1. Functional deficits secondary to R basal ganglia infarct which require 3+ hours per day of interdisciplinary therapy in a comprehensive inpatient rehab setting. Physiatrist is providing close team supervision and 24 hour management of active medical problems listed below. Physiatrist and rehab team continue to assess barriers to discharge/monitor patient progress toward  functional and medical goals. FIM: FIM - Bathing Bathing Steps Patient Completed: Chest;Left Arm;Abdomen Bathing: 2: Max-Patient completes 3-4 63f 10 parts or 25-49%  FIM - Upper Body Dressing/Undressing Upper body dressing/undressing steps patient completed: Thread/unthread left sleeve of pullover shirt/dress Upper body dressing/undressing: 1: Total-Patient completed less than 25% of tasks FIM - Lower Body Dressing/Undressing Lower body dressing/undressing: 0: Activity did not occur        FIM - Banker Devices: Therapist, occupational: 1: Two helpers  FIM - Locomotion: Printmaker: Wheelchair: 1: Total Assistance/staff pushes wheelchair (Pt<25%) FIM - Locomotion: Ambulation Ambulation/Gait Assistance: 3: Mod assist Locomotion: Ambulation: 1: Travels less than 50 ft with moderate assistance (Pt: 50 - 74%)  Comprehension Comprehension Mode: Auditory Comprehension: 5-Understands complex 90% of the time/Cues < 10% of the time  Expression Expression Mode: Verbal Expression: 6-Expresses complex ideas: With extra time/assistive device  Social Interaction Social Interaction: 6-Interacts appropriately with others with medication or extra time (anti-anxiety, antidepressant).  Problem Solving Problem Solving: 5-Solves complex 90% of the time/cues < 10% of the time  Memory Memory: 6-More than reasonable amt of time  Medical Problem List and Plan:  1. 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 DVT Prophylaxis/Anticoagulation: SCDs. Monitor for any signs of DVT  3. multiple sclerosis. MRI and C-spine showed no new lesions. Patient on no medication for MS prior to hospital admission  4. Diabetes mellitus. Hemoglobin A1c 10.5. Insulin aspart (novolog) 3 units 3 times a day and NovoLog 7030 22 units will increase to 25 units at breakfast,  cont 20 units supper. 5. Hypertension. bystolic 10 mg daily. Monitor with increased mobility  6. Hyperlipidemia. Lipitor  7. Hypokalemia. Followup labs  8. Thyroid mass. Plan is to followup with Dr. Suszanne Conners for biopsy as an outpatient. Patient would need to be off Plavix x7 days prior to procedure  9. Right rotator cuff disease probable tear, OT for range of motion and scapular stabilization  LOS (Days) 2 A FACE TO FACE EVALUATION WAS PERFORMED  Chi Woodham E 02/22/2012, 10:13 AM

## 2012-02-22 NOTE — Progress Notes (Signed)
Patient is incontinent at times; discussed toileting protocol with patient and family prior to the patient going to bed.  Patient was offered the bathroom every two hours during the night.  The patient refused to get up during the night, whether or not she had been incontinent in her brief.  Per nurse tech report, the patient had wet her brief this morning, at approximately 0615, but the patient and daughter refused to have the patient changed, stating that the brief was not wet enough.  The nurse tech discussed the potential for skin breakdown and the importance of keeping the skin clean and dry; the family and patient continued to refuse to allow staff to change the patient.  Will continue to monitor.

## 2012-02-22 NOTE — Progress Notes (Signed)
Physical Therapy Note  Patient Details  Name: Hannah Lamb MRN: [id number] Date of Birth: 30-Aug-1941 Today's Date: 02/22/2012  9:39-10:30 individual therapy pt complained of some pain in the rt foot. Pt educated to wear her diabetic shoes to support the arch of the foot. Family to bring shoes.  Requested for family to bring rollator walker from home. Discussed discharge and goals. Sit to stands throughout min to light mod assist with vc for left foot placement. Gait with rw min to mod assist. Mod assist used to push the walker faster to encourage increased step length. Pt able to ambulate x 15' and x 50' with rw. 4 steps with 2 rails with min assist alternating pattern on 4" and mod assist to descend 8" step due to UE placement on rails. Performed toe taps on 8" step using rw to increase hip flexor and extensor strength in left leg. Daughter and grand daughter present for session.  [name] 02/22/2012, 11:00 AM

## 2012-02-22 NOTE — Progress Notes (Signed)
Physical Therapy Note  Patient Details  Name: Hannah Lamb MRN: [id number] Date of Birth: 02-25-1941 Today's Date: 02/22/2012  14:00-14:45 individual therapy pt denied pain.   Stand pivot transfer with rw mod assist for sit to stand and walker position. Pt's granddaughter assisted with vc on safe guarding techniques. Performed reaching in all planes for core strengthening, weightbearing through left elbow with reaching with rt for shoulder stability, gentle stretching to reduce rt. shoulder hike with great improvement. Sit to supine min assist sit to supine supervision, rolling both ways with supervision and vc for technique. Performed lumbar roll and bridging with rotation x 15 each. Granddaughter was given these 2 exercises to perform with pt in the room with handout.  Gait x 70' with rw min to mod assist to facilitate left hip forward rotation with vc for equal step length and upper trunk control.    [name] 02/22/2012, 3:30 PM

## 2012-02-22 NOTE — Progress Notes (Signed)
Occupational Therapy Session Note  Patient Details  Name: Hannah Lamb MRN: [id number] Date of Birth: 1941-01-14  Today's Date: 02/22/2012 Time: 0930-1030 Time Calculation (min): 60 min  Short Term Goals: Week 1:  OT Short Term Goal 1 (Week 1): Patient will complete transfers, bed <> w/c, using AE prn, with supervision. ON-GOING OT Short Term Goal 2 (Week 1): Patient will complete upper body dressing, at edge of bed, unsupported with supervision ON-GOING OT Short Term Goal 3 (Week 1): Patient will complete bathing, at sink side, w/c level with min assist ON-GOING OT Short Term Goal 4 (Week 1): Patient will complete toileting, using AE (bedpan) prn, with min assist for thoroughness N/A OT Short Term Goal 5 (Week 1): Patient will demonstrate ability to complete R-UE P/SROM home exercise program with mod assist NOT MET  Skilled Therapeutic Interventions: ADL-retraining with emphasis on pt/family ed on the safe and effective use of of the following AE/DME: tub bench, BSC, transfer hand, w/c, RW.   Patient completed toileting in ADL apartment with max assist transfer w/c <>: toilet and assist with steadying and clothing management while patient stood to perform perineal hygiene after toileting.  Patient was able to complete toilet hygiene with her left hand.   Patient and family were then shown transfer method from w/c to tub bench with repeated cues and max assist to scoot laterally toward left.   Patient then performed bathing seated with OT assist to clean left UE, buttocks, and both lower legs/feet.   Patient participation was excellent with no evidence of cognitive impairment this session.   Family currently endorses potential need for w/c and BSC, post-d/c.     Therapy Documentation Precautions:  Precautions Precautions: Fall Restrictions Weight Bearing Restrictions: No  Pain: Pain Assessment Pain Assessment: No/denies pain  ADL: ADL Grooming: Setup Where Assessed-Grooming: Sitting  at sink;Wheelchair Upper Body Bathing: Moderate assistance Where Assessed-Upper Body Bathing: Sitting at sink;Wheelchair Lower Body Bathing: Maximal assistance Where Assessed-Lower Body Bathing: Sitting at sink;Wheelchair Upper Body Dressing: Moderate assistance Where Assessed-Upper Body Dressing: Sitting at sink;Wheelchair Lower Body Dressing: Dependent Where Assessed-Lower Body Dressing: Bed level Toileting: Not assessed Toilet Transfer: Not assessed Toilet Transfer Method: Not assessed Tub/Shower Transfer: Not assessed Walk-In Shower Transfer: Not assessed  See FIM for current functional status  Therapy/Group: Individual Therapy  Georgeanne Nim 02/22/2012, 11:24 AM

## 2012-02-23 LAB — GLUCOSE, CAPILLARY
Glucose-Capillary: 93 mg/dL (ref 70–99)
Glucose-Capillary: 81 mg/dL (ref 70–99)
Glucose-Capillary: 179 mg/dL — ABNORMAL HIGH (ref 70–99)
Glucose-Capillary: 103 mg/dL — ABNORMAL HIGH (ref 70–99)

## 2012-02-23 MED ORDER — MUSCLE RUB 10-15 % EX CREA
TOPICAL_CREAM | Freq: Two times a day (BID) | CUTANEOUS | Status: DC
  Administered 2012-02-23 – 2012-03-02 (×8): via TOPICAL
  Filled 2012-02-23: qty 85

## 2012-02-23 NOTE — Progress Notes (Signed)
Nursing Note: No bm in 2 days but pt declined any bowel meds.Pt had prune iuice earlier and wants to wait.wbb

## 2012-02-23 NOTE — Progress Notes (Signed)
Patient ID: Hannah Lamb, female   DOB: July 22, 1941, 71 y.o.   MRN: [id number]  Subjective/Complaints: Hannah Lamb is a 71 y.o. right-handed female with history of multiple sclerosis diagnosed 2008 and followed at [facility] on no present medications for MS and hypertension admitted 02/15/2012 with generalized weakness to the point she was unable to ambulate. MRI of the brain revealed acute infarct involving the right caudate head and anterior aspect of the right lenticular nucleus with smaller scattered infarcts throughout the right frontal lobe. There are also remote small infarcts of the basal ganglia, thalami and cerebellum bilaterally with prominent white matter type changes fairly confluent in the periventricular region (MS vs small vessel disease) that have not progressed since prior examination. MRI cervical spine with no focal cord signal abnormalities. Neurology services consulted and placed on Plavix therapy for CVA prophylaxis.  No pains  Review of Systems  Constitutional: Negative for fever and chills.  Respiratory: Negative for cough, sputum production and shortness of breath.   Cardiovascular: Positive for chest pain.       L lower rib pain last noc  Gastrointestinal: Positive for abdominal pain.  Musculoskeletal: Positive for joint pain.       R shoulder  All other systems reviewed and are negative.   Objective: Vital Signs: Blood pressure 158/74, pulse 60, temperature 98.4 F (36.9 C), temperature source Oral, resp. rate 19, height 5\' 7"  (1.702 m), weight 89.2 kg (196 lb 10.4 oz), SpO2 98.00%. No results found. Results for orders placed during the hospital encounter of 02/20/12 (from the past 72 hour(s))  GLUCOSE, CAPILLARY     Status: Abnormal   Collection Time   02/20/12  5:02 PM      Component Value Range Comment   Glucose-Capillary 182 (*) 70 - 99 mg/dL   GLUCOSE, CAPILLARY     Status: Abnormal   Collection Time   02/20/12  8:41 PM      Component Value Range  Comment   Glucose-Capillary 232 (*) 70 - 99 mg/dL   CBC     Status: Normal   Collection Time   02/21/12  6:30 AM      Component Value Range Comment   WBC 6.5  4.0 - 10.5 K/uL    RBC 4.34  3.87 - 5.11 MIL/uL    Hemoglobin 12.3  12.0 - 15.0 g/dL    HCT 29.5  62.1 - 30.8 %    MCV 85.0  78.0 - 100.0 fL    MCH 28.3  26.0 - 34.0 pg    MCHC 33.3  30.0 - 36.0 g/dL    RDW 65.7  84.6 - 96.2 %    Platelets 151  150 - 400 K/uL   COMPREHENSIVE METABOLIC PANEL     Status: Abnormal   Collection Time   02/21/12  6:30 AM      Component Value Range Comment   Sodium 143  135 - 145 mEq/L    Potassium 3.7  3.5 - 5.1 mEq/L    Chloride 106  96 - 112 mEq/L    CO2 25  19 - 32 mEq/L    Glucose, Bld 64 (*) 70 - 99 mg/dL    BUN 13  6 - 23 mg/dL    Creatinine, Ser 9.52  0.50 - 1.10 mg/dL    Calcium 9.0  8.4 - 84.1 mg/dL    Total Protein 6.7  6.0 - 8.3 g/dL    Albumin 2.9 (*) 3.5 - 5.2 g/dL  AST 16  0 - 37 U/L    ALT 9  0 - 35 U/L    Alkaline Phosphatase 47  39 - 117 U/L    Total Bilirubin 0.5  0.3 - 1.2 mg/dL    GFR calc non Af Amer 87 (*) >90 mL/min    GFR calc Af Amer >90  >90 mL/min   DIFFERENTIAL     Status: Abnormal   Collection Time   02/21/12  6:30 AM      Component Value Range Comment   Neutrophils Relative 38 (*) 43 - 77 %    Neutro Abs 2.5  1.7 - 7.7 K/uL    Lymphocytes Relative 50 (*) 12 - 46 %    Lymphs Abs 3.2  0.7 - 4.0 K/uL    Monocytes Relative 9  3 - 12 %    Monocytes Absolute 0.6  0.1 - 1.0 K/uL    Eosinophils Relative 4  0 - 5 %    Eosinophils Absolute 0.2  0.0 - 0.7 K/uL    Basophils Relative 0  0 - 1 %    Basophils Absolute 0.0  0.0 - 0.1 K/uL   GLUCOSE, CAPILLARY     Status: Abnormal   Collection Time   02/21/12  7:33 AM      Component Value Range Comment   Glucose-Capillary 65 (*) 70 - 99 mg/dL    Comment 1 Notify RN     GLUCOSE, CAPILLARY     Status: Normal   Collection Time   02/21/12  7:55 AM      Component Value Range Comment   Glucose-Capillary 83  70 - 99 mg/dL     Comment 1 Notify RN     GLUCOSE, CAPILLARY     Status: Abnormal   Collection Time   02/21/12 11:37 AM      Component Value Range Comment   Glucose-Capillary 56 (*) 70 - 99 mg/dL    Comment 1 Notify RN     GLUCOSE, CAPILLARY     Status: Abnormal   Collection Time   02/21/12 12:02 PM      Component Value Range Comment   Glucose-Capillary 57 (*) 70 - 99 mg/dL    Comment 1 Notify RN     GLUCOSE, CAPILLARY     Status: Abnormal   Collection Time   02/21/12 12:20 PM      Component Value Range Comment   Glucose-Capillary 67 (*) 70 - 99 mg/dL    Comment 1 Notify RN     GLUCOSE, CAPILLARY     Status: Abnormal   Collection Time   02/21/12  1:18 PM      Component Value Range Comment   Glucose-Capillary 120 (*) 70 - 99 mg/dL    Comment 1 Notify RN     GLUCOSE, CAPILLARY     Status: Abnormal   Collection Time   02/21/12  4:52 PM      Component Value Range Comment   Glucose-Capillary 133 (*) 70 - 99 mg/dL   GLUCOSE, CAPILLARY     Status: Abnormal   Collection Time   02/21/12  9:33 PM      Component Value Range Comment   Glucose-Capillary 180 (*) 70 - 99 mg/dL   GLUCOSE, CAPILLARY     Status: Abnormal   Collection Time   02/22/12  7:30 AM      Component Value Range Comment   Glucose-Capillary 178 (*) 70 - 99 mg/dL    Comment 1 Notify RN  GLUCOSE, CAPILLARY     Status: Abnormal   Collection Time   02/22/12 11:24 AM      Component Value Range Comment   Glucose-Capillary 201 (*) 70 - 99 mg/dL    Comment 1 Notify RN     GLUCOSE, CAPILLARY     Status: Abnormal   Collection Time   02/22/12  4:33 PM      Component Value Range Comment   Glucose-Capillary 129 (*) 70 - 99 mg/dL   GLUCOSE, CAPILLARY     Status: Normal   Collection Time   02/22/12  8:53 PM      Component Value Range Comment   Glucose-Capillary 98  70 - 99 mg/dL   GLUCOSE, CAPILLARY     Status: Normal   Collection Time   02/23/12  7:19 AM      Component Value Range Comment   Glucose-Capillary 93  70 - 99 mg/dL    Comment  1 Notify RN       HEENT: normal Cardio: RRR Resp: CTA B/L GI: BS positive Extremity:  No Edema Skin:   Intact Neuro: Alert/Oriented, Abnormal Sensory reduced sensation to lt touch and Abnormal Motor reduced strenth on MMT Musc/Skel:  Other positive impingement R shoulder Back soreness PSIS Lower ext strength 3-/5 in L HF , Quad , TA and Gastroc 4-/5 on R side 5/5 RUE, 4/5 LUE -SLR Assessment/Plan: 1. Functional deficits secondary to R basal ganglia infarct which require 3+ hours per day of interdisciplinary therapy in a comprehensive inpatient rehab setting. Physiatrist is providing close team supervision and 24 hour management of active medical problems listed below. Physiatrist and rehab team continue to assess barriers to discharge/monitor patient progress toward functional and medical goals. FIM: FIM - Bathing Bathing Steps Patient Completed: Chest;Right Arm;Abdomen;Front perineal area;Right upper leg;Left upper leg Bathing: 3: Mod-Patient completes 5-7 7f 10 parts or 50-74%  FIM - Upper Body Dressing/Undressing Upper body dressing/undressing steps patient completed: Thread/unthread right sleeve of pullover shirt/dresss;Thread/unthread left sleeve of pullover shirt/dress;Put head through opening of pull over shirt/dress;Pull shirt over trunk Upper body dressing/undressing: 4: Min-Patient completed 75 plus % of tasks FIM - Lower Body Dressing/Undressing Lower body dressing/undressing: 1: Total-Patient completed less than 25% of tasks  FIM - Toileting Toileting steps completed by patient: Performs perineal hygiene Toileting Assistive Devices: Grab bar or rail for support Toileting: 2: Max-Patient completed 1 of 3 steps  FIM - Diplomatic Services operational officer Devices: Grab bars Toilet Transfers: 2-To toilet/BSC: Max A (lift and lower assist);2-From toilet/BSC: Max A (lift and lower assist)  FIM - Press photographer Assistive Devices: Bed  rails;Walker Bed/Chair Transfer: 2: Bed > Chair or W/C: Max A (lift and lower assist);4: Supine > Sit: Min A (steadying Pt. > 75%/lift 1 leg);5: Set-up assist to: Apply orthosis/W/C set-up;6: More than reasonable amt of time  FIM - Locomotion: Wheelchair Locomotion: Wheelchair: 0: Activity did not occur FIM - Locomotion: Ambulation Ambulation/Gait Assistance: 3: Mod assist Locomotion: Ambulation: 2: Travels 50 - 149 ft with moderate assistance (Pt: 50 - 74%)  Comprehension Comprehension Mode: Auditory Comprehension: 5-Understands basic 90% of the time/requires cueing < 10% of the time  Expression Expression Mode: Verbal Expression: 5-Expresses basic needs/ideas: With no assist  Social Interaction Social Interaction: 5-Interacts appropriately 90% of the time - Needs monitoring or encouragement for participation or interaction.  Problem Solving Problem Solving: 4-Solves basic 75 - 89% of the time/requires cueing 10 - 24% of the time  Memory Memory: 4-Recognizes or recalls  75 - 89% of the time/requires cueing 10 - 24% of the time  Medical Problem List and Plan:  1. 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 DVT Prophylaxis/Anticoagulation: SCDs. Monitor for any signs of DVT  3. multiple sclerosis. MRI and C-spine showed no new lesions. Patient on no medication for MS prior to hospital admission  4. Diabetes mellitus. Hemoglobin A1c 10.5. Insulin aspart (novolog) 3 units 3 times a day and NovoLog 7030 22 units will increase to 25 units at breakfast, cont 20 units supper. 5. Hypertension. bystolic 10 mg daily. Monitor with increased mobility  6. Hyperlipidemia. Lipitor  7. Hypokalemia. Followup labs  8. Thyroid mass. Plan is to followup with Dr. Suszanne Conners for biopsy as an outpatient. Patient would need to be off Plavix x7 days prior to procedure  9. Right rotator cuff disease probable tear, OT for  range of motion and scapular stabilization  LOS (Days) 3 A FACE TO FACE EVALUATION WAS PERFORMED  Laynie Espy E 02/23/2012, 7:44 AM

## 2012-02-23 NOTE — Care Management Note (Signed)
Per State Regulation 482.30 This chart was reviewed for medical necessity with respect to the patient's Admission/Duration of stay. Pt participating txs.  Family very supportive.  DM meds adjusted.   Brock Ra                 Nurse Care Manager              Next Review Date: 02/29/12

## 2012-02-23 NOTE — Progress Notes (Signed)
Occupational Therapy Session Note  Patient Details  Name: Hannah Lamb MRN: [id number] Date of Birth: 1940-12-08  Today's Date: 02/23/2012 Time: 4098-1191 and 4782-9562 Time Calculation (min): 65 min and 45 min  Short Term Goals: Week 1:  OT Short Term Goal 1 (Week 1): Patient will complete transfers, bed <> w/c, using AE prn, with supervision. OT Short Term Goal 2 (Week 1): Patient will complete upper body dressing, at edge of bed, unsupported with supervision OT Short Term Goal 3 (Week 1): Patient will complete bathing, at sink side, w/c level with min assist OT Short Term Goal 4 (Week 1): Patient will complete toileting, using AE (bedpan) prn, with min assist for thoroughness OT Short Term Goal 5 (Week 1): Patient will demonstrate ability to complete R-UE P/SROM home exercise program with mod assist  Skilled Therapeutic Interventions/Progress Updates:    1) Pt seen for ADL retraining with focus on bed mobility, stand pivot transfers, toileting, RUE use, and stroke education with pt and dtr.  Pt with increased initiation with bed mobility and transfers, continues to require mod (lifting) assist with stand pivot transfers.  Pt mod assist with toilet transfer, demonstrating appropriate attention to transfer and sidestepping, continuing to require assist to lift from sit to stand.  [name] with multiple questions regarding stroke prevention and detection.  Plan to provide additional stroke info in PM session.  2) 1:1 OT with focus on stand pivot transfers without AD and dynamic standing balance with weight bearing through LUE and reaching in various planes with LUE.  Pt requires increased time for sit to stand and stand pivot transfer secondary to LLE "getting stuck" and requiring mod-max vc's for weight shifting onto RLE to step with LLE.  Pt verbalizes fearfulness of falling and relates this process to learning how to ride a bike.  Pt reports feeling her body/brain tell her one thing and  therapists telling her something else.  Min assist in standing with focus on weight shifting to promote stepping and increase balance reactions in standing.  Therapy Documentation Precautions:  Precautions Precautions: Fall Restrictions Weight Bearing Restrictions: No Pain: Pain Assessment Pain Assessment: No/denies pain  See FIM for current functional status  Therapy/Group: Individual Therapy  Leonette Monarch 02/23/2012, 12:16 PM

## 2012-02-23 NOTE — Patient Care Conference (Signed)
Inpatient RehabilitationTeam Conference Note Date: 02/22/2012   Time: 10:48 AM    Patient Name: Hannah Lamb      Medical Record Number: [id number]  Date of Birth: 1941/06/04 Sex: Female         Room/Bed: 4034/4034-01 Payor Info: Payor: MEDICARE  Plan: MEDICARE PART A AND B  Product Type: *No Product type*     Admitting Diagnosis: R CVA -MS  Admit Date/Time:  02/20/2012  4:51 PM Admission Comments: No comment available   Primary Diagnosis:  CVA (cerebral infarction) Principal Problem: CVA (cerebral infarction)  Patient Active Problem List   Diagnosis Date Noted  . CVA (cerebral infarction) 02/21/2012  . Abnormal EKG 02/16/2012  . Weakness generalized 02/15/2012  . Hypertensive emergency 02/15/2012  . Multiple sclerosis 02/15/2012  . DIABETES MELLITUS II, UNCOMPLICATED 11/09/2006  . HYPERCHOLESTEROLEMIA 11/09/2006  . HYPERTENSION, BENIGN SYSTEMIC 11/09/2006  . ARTHRITIS 11/09/2006    Expected Discharge Date: Expected Discharge Date: 03/05/12  Team Members Present: Physician: Dr. Claudette Laws Case Manager Present: Melanee Spry, RN Social Worker Present: Dossie Der, LCSW Nurse Present: Rosalio Macadamia, RN PT Present: Edman Circle, Judith Blonder, PTA;Edson Snowball, PT OT Present: Leonette Monarch, OT;Other (comment) Vernona Rieger Essenmacher) SLP Present: Other (comment) Elio Forget)     Current Status/Progress Goal Weekly Team Focus  Medical   left hemiparesis improving, diabetes, history of MS  maintain medical stability during therapy program  medication adjustment   Bowel/Bladder   Incontinent of bowel of bladder, brief in use;  Last BM 6/11 continent in the bathromm with time toileting.  time toileting q3hr.    Continue to offer patient to use the bathroom q3h, preventing from incontinent episodes.   Swallow/Nutrition/ Hydration             ADL's             Mobility   mod-max A  supervision  balance, transfers   Communication             Safety/Cognition/ Behavioral  Observations            Pain   N/A         Skin   No skin issues noted  no new skin breakdown  turn and reposition q2hr      *See Interdisciplinary Assessment and Plan and progress notes for long and short-term goals  Barriers to Discharge: has multiple children, identify post hospital caregivers    Possible Resolutions to Barriers:  training post hospital caregivers    Discharge Planning/Teaching Needs:  Lives with daughter who can provide care along with other family members      Team Discussion: Plan to start using pt's own rollator walker to see if it will work for her--plan family ed end of week before d/c.   Revisions to Treatment Plan: none    Continued Need for Acute Rehabilitation Level of Care: The patient requires daily medical management by a physician with specialized training in physical medicine and rehabilitation for the following conditions: Daily direction of a multidisciplinary physical rehabilitation program to ensure safe treatment while eliciting the highest outcome that is of practical value to the patient.: Yes Daily medical management of patient stability for increased activity during participation in an intensive rehabilitation regime.: Yes Daily analysis of laboratory values and/or radiology reports with any subsequent need for medication adjustment of medical intervention for : Neurological problems  Brock Ra 02/23/2012, 5:48 PM

## 2012-02-23 NOTE — Progress Notes (Signed)
Physical Therapy Session Note  Patient Details  Name: Hannah Lamb MRN: [id number] Date of Birth: 08-26-1941  Today's Date: 02/23/2012 Time: 1005-1100 Time Calculation (min): 55 min  Short Term Goals: Week 1:  PT Short Term Goal 1 (Week 1): Pt will perform bed <> chair transfers with min A PT Short Term Goal 2 (Week 1): Pt will gait in controlled environment with supervision 50' PT Short Term Goal 3 (Week 1): Pt will demo dynamic standing balance for functional task with min A      Skilled Therapeutic Interventions/Progress Updates: focus on mobility, gait training, therex, Therapeutic activities  1st session:  Stand pivot transfer with mod assist due to difficulty wt shifting to R.  Gait training x 88' with min assist, using Rollator from home, with tactile cues and verbal cues for upright posture, forward gaze, wider stance, and wt shift to R.  Therapeutic exercise performed with bil LE to increase strength for functional mobility: 10 x 2 knee extension with 3#, L and R; 10 x 1 hip flexion , alphabet for ankle mobility.  2nd session: initiated famly ed with daughter Harvie Bridge-  gait training in home setting, congested with furniture, x 10' with min assist, with Mae  Bed mobility sit> supine with min assist for LLE; supine> sit modified independent.  Therapeutic ex:  bil bridging 1 x 10; L bridging 1 x 10 with assistance; in supine, LLE on/off bed 1 x 10.  Daughter instructed in therex.  Bed> w/c to R stand step with min assist.  Pt is quite fearful of transferring, due to fear of falling.      Therapy Documentation Precautions:  Precautions Precautions: Fall Restrictions Weight Bearing Restrictions: No   Pain: Pain Assessment Pain Assessment: No/denies pain   Locomotion : Ambulation Ambulation/Gait Assistance: 4: Min assist      See FIM for current functional status  Therapy/Group: Individual Therapy  Hani Campusano 02/23/2012, 11:04 AM

## 2012-02-24 LAB — URINALYSIS, ROUTINE W REFLEX MICROSCOPIC
Glucose, UA: NEGATIVE mg/dL
Ketones, ur: NEGATIVE mg/dL
Protein, ur: NEGATIVE mg/dL

## 2012-02-24 LAB — GLUCOSE, CAPILLARY
Glucose-Capillary: 115 mg/dL — ABNORMAL HIGH (ref 70–99)
Glucose-Capillary: 178 mg/dL — ABNORMAL HIGH (ref 70–99)

## 2012-02-24 LAB — URINE MICROSCOPIC-ADD ON

## 2012-02-24 MED ORDER — CIPROFLOXACIN HCL 250 MG PO TABS
250.0000 mg | ORAL_TABLET | Freq: Two times a day (BID) | ORAL | Status: DC
  Administered 2012-02-24: 250 mg via ORAL
  Filled 2012-02-24 (×4): qty 1

## 2012-02-24 NOTE — Progress Notes (Signed)
Occupational Therapy Session Note  Patient Details  Name: Hannah Lamb MRN: [id number] Date of Birth: 1941/01/16  Today's Date: 02/24/2012 Time:  -  1000-1100  ( )  1st session                1300-1345 ( )   2nd session    Short Term Goals: Week 1:   Skilled Therapeutic Interventions/Progress Updates:    1st session:   Performed bathing and dressing at shower level.  Pt. Transferred to 3n1 over toilet with minimal assist.  Pt transferred from wc to shower bench with minimal assist.  Pt able to reach feet but not clean in between toes.   Pt required minimal assit for sit to stand activites and demonstrated decreased eccentric control when going from stand to sit.   2nd session:  Practiced functional mobility with rollator walker in kitchen.  Pt. Was minimal assist with mobility.  Did reaching in refrigerator using weaker hand (right).  Practiced sitting in dining chair and going from sit to stand.    Therapy Documentation Precautions:  Precautions Precautions: Fall Restrictions Weight Bearing Restrictions: No  Pain: 2/ 10 right shoulder  (1st and 2nd session)     Other Treatments:   See FIM for current functional status  Therapy/Group: Individual Therapy  Humberto Seals 02/24/2012, 11:01 AM

## 2012-02-24 NOTE — Progress Notes (Signed)
Physical Therapy Session Note  Patient Details  Name: AMELIE CARACCI MRN: [id number] Date of Birth: [birth year]  Today's Date: 02/24/2012 Time: 1120-1205 Time Calculation (min): 45 min    Skilled Therapeutic Interventions/Progress Updates: focus on gait training, sit>< stand, standing endurance and standing balance during bil UE activity     Therapeutic exercise in sitting performed with LEs to increase strength for functional mobility:alternating hip flexion, knee extension, ankle pumps, x 5 each R and L before attempting to stand.  Sit> stand with RW from recliner, with close supervision, VCs to keep wt shift continuing forward.  Gait training in hallway, x 125' with close supervision, mod VCs to look forward, extend trunk, and increase velocity.  Standing at a high table during BUE fine motor task, without UE or truncal support on table, x 11 minutes, without LOB.    LLE noted to stay in slight flexion throughout standing, even with VCs, tactile cues.  Therapy Documentation Precautions:  Precautions Precautions: Fall Restrictions Weight Bearing Restrictions: No    Pain: Pain Assessment Pain Assessment: No/denies pain    Locomotion : Ambulation Ambulation/Gait Assistance: 4: Min assist      See FIM for current functional status  Therapy/Group: Individual Therapy  Taquisha Phung 02/24/2012, 2:21 PM

## 2012-02-24 NOTE — Progress Notes (Signed)
Patient ID: Hannah Lamb, female   DOB: December 02, 1940, 71 y.o.   MRN: [id number]  Subjective/Complaints: Hannah Lamb is a 71 y.o. right-handed female with history of multiple sclerosis diagnosed 2008 and followed at [facility] on no present medications for MS and hypertension admitted 02/15/2012 with generalized weakness to the point she was unable to ambulate. MRI of the brain revealed acute infarct involving the right caudate head and anterior aspect of the right lenticular nucleus with smaller scattered infarcts throughout the right frontal lobe. There are also remote small infarcts of the basal ganglia, thalami and cerebellum bilaterally with prominent white matter type changes fairly confluent in the periventricular region (MS vs small vessel disease) that have not progressed since prior examination. MRI cervical spine with no focal cord signal abnormalities. Neurology services consulted and placed on Plavix therapy for CVA prophylaxis.  No pains  Review of Systems  Constitutional: Negative for fever and chills.  Respiratory: Negative for cough, sputum production and shortness of breath.   Cardiovascular: Positive for chest pain.       L lower rib pain last noc  Gastrointestinal: Positive for abdominal pain.  Musculoskeletal: Positive for joint pain.       R shoulder  All other systems reviewed and are negative.   Objective: Vital Signs: Blood pressure 170/74, pulse 60, temperature 97.9 F (36.6 C), temperature source Oral, resp. rate 19, height 5\' 7"  (1.702 m), weight 89.2 kg (196 lb 10.4 oz), SpO2 97.00%. No results found. Results for orders placed during the hospital encounter of 02/20/12 (from the past 72 hour(s))  GLUCOSE, CAPILLARY     Status: Abnormal   Collection Time   02/21/12 11:37 AM      Component Value Range Comment   Glucose-Capillary 56 (*) 70 - 99 mg/dL    Comment 1 Notify RN     GLUCOSE, CAPILLARY     Status: Abnormal   Collection Time   02/21/12 12:02 PM   Component Value Range Comment   Glucose-Capillary 57 (*) 70 - 99 mg/dL    Comment 1 Notify RN     GLUCOSE, CAPILLARY     Status: Abnormal   Collection Time   02/21/12 12:20 PM      Component Value Range Comment   Glucose-Capillary 67 (*) 70 - 99 mg/dL    Comment 1 Notify RN     GLUCOSE, CAPILLARY     Status: Abnormal   Collection Time   02/21/12  1:18 PM      Component Value Range Comment   Glucose-Capillary 120 (*) 70 - 99 mg/dL    Comment 1 Notify RN     GLUCOSE, CAPILLARY     Status: Abnormal   Collection Time   02/21/12  4:52 PM      Component Value Range Comment   Glucose-Capillary 133 (*) 70 - 99 mg/dL   GLUCOSE, CAPILLARY     Status: Abnormal   Collection Time   02/21/12  9:33 PM      Component Value Range Comment   Glucose-Capillary 180 (*) 70 - 99 mg/dL   GLUCOSE, CAPILLARY     Status: Abnormal   Collection Time   02/22/12  7:30 AM      Component Value Range Comment   Glucose-Capillary 178 (*) 70 - 99 mg/dL    Comment 1 Notify RN     GLUCOSE, CAPILLARY     Status: Abnormal   Collection Time   02/22/12 11:24 AM  Component Value Range Comment   Glucose-Capillary 201 (*) 70 - 99 mg/dL    Comment 1 Notify RN     GLUCOSE, CAPILLARY     Status: Abnormal   Collection Time   02/22/12  4:33 PM      Component Value Range Comment   Glucose-Capillary 129 (*) 70 - 99 mg/dL   GLUCOSE, CAPILLARY     Status: Normal   Collection Time   02/22/12  8:53 PM      Component Value Range Comment   Glucose-Capillary 98  70 - 99 mg/dL   GLUCOSE, CAPILLARY     Status: Normal   Collection Time   02/23/12  7:19 AM      Component Value Range Comment   Glucose-Capillary 93  70 - 99 mg/dL    Comment 1 Notify RN     GLUCOSE, CAPILLARY     Status: Normal   Collection Time   02/23/12 11:38 AM      Component Value Range Comment   Glucose-Capillary 81  70 - 99 mg/dL    Comment 1 Notify RN     GLUCOSE, CAPILLARY     Status: Abnormal   Collection Time   02/23/12  4:35 PM      Component Value  Range Comment   Glucose-Capillary 179 (*) 70 - 99 mg/dL   GLUCOSE, CAPILLARY     Status: Abnormal   Collection Time   02/23/12  9:16 PM      Component Value Range Comment   Glucose-Capillary 103 (*) 70 - 99 mg/dL   GLUCOSE, CAPILLARY     Status: Abnormal   Collection Time   02/24/12  7:27 AM      Component Value Range Comment   Glucose-Capillary 108 (*) 70 - 99 mg/dL    Comment 1 Notify RN       HEENT: normal Cardio: RRR Resp: CTA B/L GI: BS positive Extremity:  No Edema Skin:   Intact Neuro: Alert/Oriented, Abnormal Sensory reduced sensation to lt touch and Abnormal Motor reduced strenth on MMT Musc/Skel:  Other positive impingement R shoulder Back soreness PSIS Lower ext strength 3-/5 in L HF , Quad , TA and Gastroc 4-/5 on R side 5/5 RUE, 4/5 LUE -SLR Assessment/Plan: 1. Functional deficits secondary to R basal ganglia infarct which require 3+ hours per day of interdisciplinary therapy in a comprehensive inpatient rehab setting. Physiatrist is providing close team supervision and 24 hour management of active medical problems listed below. Physiatrist and rehab team continue to assess barriers to discharge/monitor patient progress toward functional and medical goals. FIM: FIM - Bathing Bathing Steps Patient Completed: Chest;Right Arm;Left Arm;Abdomen;Front perineal area;Right upper leg;Left upper leg Bathing: 3: Mod-Patient completes 5-7 77f 10 parts or 50-74%  FIM - Upper Body Dressing/Undressing Upper body dressing/undressing steps patient completed: Thread/unthread right sleeve of pullover shirt/dresss;Thread/unthread left sleeve of pullover shirt/dress;Put head through opening of pull over shirt/dress;Pull shirt over trunk Upper body dressing/undressing: 5: Set-up assist to: Obtain clothing/put away FIM - Lower Body Dressing/Undressing Lower body dressing/undressing steps patient completed: Thread/unthread right underwear leg;Thread/unthread right pants leg;Thread/unthread  left pants leg Lower body dressing/undressing: 2: Max-Patient completed 25-49% of tasks  FIM - Toileting Toileting steps completed by patient: Adjust clothing prior to toileting;Performs perineal hygiene Toileting Assistive Devices: Grab bar or rail for support Toileting: 3: Mod-Patient completed 2 of 3 steps  FIM - Diplomatic Services operational officer Devices: Grab bars Toilet Transfers: 3-To toilet/BSC: Mod A (lift or lower assist);3-From toilet/BSC: Mod  A (lift or lower assist)  FIM - Bed/Chair Transfer Bed/Chair Transfer Assistive Devices: Bed rails;Arm rests Bed/Chair Transfer: 5: Supine > Sit: Supervision (verbal cues/safety issues);3: Bed > Chair or W/C: Mod A (lift or lower assist);3: Chair or W/C > Bed: Mod A (lift or lower assist)  FIM - Locomotion: Wheelchair Locomotion: Wheelchair: 0: Activity did not occur FIM - Locomotion: Ambulation Locomotion: Ambulation Assistive Devices: Radio producer) Ambulation/Gait Assistance: 4: Min assist Locomotion: Ambulation: 2: Travels 50 - 149 ft with minimal assistance (Pt.>75%)  Comprehension Comprehension Mode: Auditory Comprehension: 5-Understands complex 90% of the time/Cues < 10% of the time  Expression Expression Mode: Verbal Expression: 5-Expresses basic 90% of the time/requires cueing < 10% of the time.  Social Interaction Social Interaction: 5-Interacts appropriately 90% of the time - Needs monitoring or encouragement for participation or interaction.  Problem Solving Problem Solving: 4-Solves basic 75 - 89% of the time/requires cueing 10 - 24% of the time  Memory Memory: 4-Recognizes or recalls 75 - 89% of the time/requires cueing 10 - 24% of the time  Medical Problem List and Plan:  1. 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 DVT Prophylaxis/Anticoagulation: SCDs. Monitor for any signs of DVT  3.  multiple sclerosis. MRI and C-spine showed no new lesions. Patient on no medication for MS prior to hospital admission  4. Diabetes mellitus. Hemoglobin A1c 10.5. Insulin aspart (novolog) 3 units 3 times a day and NovoLog 7030 22 units will increase to 25 units at breakfast, cont 20 units supper. 5. Hypertension. bystolic 10 mg daily. Monitor with increased mobility  6. Hyperlipidemia. Lipitor  7. Hypokalemia. Followup labs  8. Thyroid mass. Plan is to followup with Dr. Suszanne Conners for biopsy as an outpatient. Patient would need to be off Plavix x7 days prior to procedure  9. Right rotator cuff disease probable tear, OT for range of motion and scapular stabilization 10.  Back pain improved with therapy and heat and analgesic balm LOS (Days) 4 A FACE TO FACE EVALUATION WAS PERFORMED  Geneva Barrero E 02/24/2012, 7:55 AM

## 2012-02-24 NOTE — Progress Notes (Signed)
Physical Therapy Note  Patient Details  Name: SOMA BACHAND MRN: [id number] Date of Birth: 12-Oct-1940 Today's Date: 02/24/2012  15:15-16:05 individual therapy pt initially complained of some knee pain but she declined intervention.  Gait with rollator walker 160' with decreased gait speed. Performed sit to stands from slightly elevated mat focusing on forward weight shift and foot placement with light UE support x 15, side step and forward step ups to strengthen hip flexors and hip abductors x 10 each leg.   [name] 02/24/2012, 4:33 PM

## 2012-02-25 DIAGNOSIS — I6330 Cerebral infarction due to thrombosis of unspecified cerebral artery: Secondary | ICD-10-CM

## 2012-02-25 DIAGNOSIS — G35 Multiple sclerosis: Secondary | ICD-10-CM

## 2012-02-25 DIAGNOSIS — G8110 Spastic hemiplegia affecting unspecified side: Secondary | ICD-10-CM

## 2012-02-25 DIAGNOSIS — Z5189 Encounter for other specified aftercare: Secondary | ICD-10-CM

## 2012-02-25 LAB — GLUCOSE, CAPILLARY
Glucose-Capillary: 324 mg/dL — ABNORMAL HIGH (ref 70–99)
Glucose-Capillary: 42 mg/dL — CL (ref 70–99)

## 2012-02-25 MED ORDER — LIVING WELL WITH DIABETES BOOK
Freq: Once | Status: AC
  Administered 2012-02-25: 13:00:00
  Filled 2012-02-25: qty 1

## 2012-02-25 MED ORDER — GLUCOSE 40 % PO GEL
ORAL | Status: AC
  Administered 2012-02-25: 21:00:00
  Filled 2012-02-25: qty 1

## 2012-02-25 NOTE — Progress Notes (Signed)
Patient ID: Hannah Lamb, female   DOB: Mar 28, 1941, 71 y.o.   MRN: [id number] Patient ID: Hannah Lamb, female   DOB: [birth year], 71 y.o.   MRN: [id number]  Subjective/Complaints: Family concerned about patient being on cipro. bp's increased slightly. Patient overall doing well  No pains  Review of Systems  Constitutional: Negative for fever and chills.  Respiratory: Negative for cough, sputum production and shortness of breath.   Cardiovascular: Positive for chest pain.       L lower rib pain last noc  Gastrointestinal: Positive for abdominal pain.  Musculoskeletal: Positive for joint pain.       R shoulder  All other systems reviewed and are negative.   Objective: Vital Signs: Blood pressure 180/78, pulse 60, temperature 98.7 F (37.1 C), temperature source Oral, resp. rate 18, height 5\' 7"  (1.702 m), weight 89.2 kg (196 lb 10.4 oz), SpO2 99.00%. No results found. Results for orders placed during the hospital encounter of 02/20/12 (from the past 72 hour(s))  GLUCOSE, CAPILLARY     Status: Abnormal   Collection Time   02/22/12  7:30 AM      Component Value Range Comment   Glucose-Capillary 178 (*) 70 - 99 mg/dL    Comment 1 Notify RN     GLUCOSE, CAPILLARY     Status: Abnormal   Collection Time   02/22/12 11:24 AM      Component Value Range Comment   Glucose-Capillary 201 (*) 70 - 99 mg/dL    Comment 1 Notify RN     GLUCOSE, CAPILLARY     Status: Abnormal   Collection Time   02/22/12  4:33 PM      Component Value Range Comment   Glucose-Capillary 129 (*) 70 - 99 mg/dL   GLUCOSE, CAPILLARY     Status: Normal   Collection Time   02/22/12  8:53 PM      Component Value Range Comment   Glucose-Capillary 98  70 - 99 mg/dL   GLUCOSE, CAPILLARY     Status: Normal   Collection Time   02/23/12  7:19 AM      Component Value Range Comment   Glucose-Capillary 93  70 - 99 mg/dL    Comment 1 Notify RN     GLUCOSE, CAPILLARY     Status: Normal   Collection Time   02/23/12 11:38 AM    Component Value Range Comment   Glucose-Capillary 81  70 - 99 mg/dL    Comment 1 Notify RN     GLUCOSE, CAPILLARY     Status: Abnormal   Collection Time   02/23/12  4:35 PM      Component Value Range Comment   Glucose-Capillary 179 (*) 70 - 99 mg/dL   GLUCOSE, CAPILLARY     Status: Abnormal   Collection Time   02/23/12  9:16 PM      Component Value Range Comment   Glucose-Capillary 103 (*) 70 - 99 mg/dL   GLUCOSE, CAPILLARY     Status: Abnormal   Collection Time   02/24/12  7:27 AM      Component Value Range Comment   Glucose-Capillary 108 (*) 70 - 99 mg/dL    Comment 1 Notify RN     URINALYSIS, ROUTINE W REFLEX MICROSCOPIC     Status: Abnormal   Collection Time   02/24/12 10:40 AM      Component Value Range Comment   Color, Urine YELLOW  YELLOW    APPearance TURBID (*) CLEAR  Specific Gravity, Urine 1.018  1.005 - 1.030    pH 5.0  5.0 - 8.0    Glucose, UA NEGATIVE  NEGATIVE mg/dL    Hgb urine dipstick LARGE (*) NEGATIVE    Bilirubin Urine NEGATIVE  NEGATIVE    Ketones, ur NEGATIVE  NEGATIVE mg/dL    Protein, ur NEGATIVE  NEGATIVE mg/dL    Urobilinogen, UA 0.2  0.0 - 1.0 mg/dL    Nitrite POSITIVE (*) NEGATIVE    Leukocytes, UA LARGE (*) NEGATIVE   URINE MICROSCOPIC-ADD ON     Status: Abnormal   Collection Time   02/24/12 10:40 AM      Component Value Range Comment   Squamous Epithelial / LPF MANY (*) RARE    WBC, UA TOO NUMEROUS TO COUNT  <3 WBC/hpf    RBC / HPF 7-10  <3 RBC/hpf    Bacteria, UA MANY (*) RARE   GLUCOSE, CAPILLARY     Status: Normal   Collection Time   02/24/12 11:33 AM      Component Value Range Comment   Glucose-Capillary 87  70 - 99 mg/dL    Comment 1 Notify RN     GLUCOSE, CAPILLARY     Status: Abnormal   Collection Time   02/24/12  4:35 PM      Component Value Range Comment   Glucose-Capillary 115 (*) 70 - 99 mg/dL   GLUCOSE, CAPILLARY     Status: Abnormal   Collection Time   02/24/12  9:35 PM      Component Value Range Comment    Glucose-Capillary 178 (*) 70 - 99 mg/dL     HEENT: normal Cardio: RRR Resp: CTA B/L GI: BS positive Extremity:  No Edema Skin:   Intact Neuro: Alert/Oriented, Abnormal Sensory reduced sensation to lt touch and Abnormal Motor reduced strenth on MMT Musc/Skel:  Other positive impingement R shoulder Back soreness PSIS Lower ext strength 3-/5 in L HF , Quad , TA and Gastroc 4-/5 on R side 5/5 RUE, 4/5 LUE. Overall better memory and awareness -SLR Assessment/Plan: 1. Functional deficits secondary to R basal ganglia infarct which require 3+ hours per day of interdisciplinary therapy in a comprehensive inpatient rehab setting. Physiatrist is providing close team supervision and 24 hour management of active medical problems listed below. Physiatrist and rehab team continue to assess barriers to discharge/monitor patient progress toward functional and medical goals. FIM: FIM - Bathing Bathing Steps Patient Completed: Chest;Right Arm;Left Arm;Abdomen;Front perineal area;Right upper leg;Left upper leg Bathing: 3: Mod-Patient completes 5-7 10f 10 parts or 50-74%  FIM - Upper Body Dressing/Undressing Upper body dressing/undressing steps patient completed: Thread/unthread right sleeve of pullover shirt/dresss;Thread/unthread left sleeve of pullover shirt/dress;Put head through opening of pull over shirt/dress Upper body dressing/undressing: 4: Min-Patient completed 75 plus % of tasks FIM - Lower Body Dressing/Undressing Lower body dressing/undressing steps patient completed: Thread/unthread right underwear leg;Thread/unthread left underwear leg;Pull underwear up/down;Thread/unthread right pants leg;Thread/unthread left pants leg;Don/Doff right sock;Don/Doff left sock Lower body dressing/undressing: 4: Min-Patient completed 75 plus % of tasks  FIM - Toileting Toileting steps completed by patient: Adjust clothing prior to toileting;Adjust clothing after toileting;Performs perineal hygiene Toileting  Assistive Devices: Grab bar or rail for support Toileting: 3: Mod-Patient completed 2 of 3 steps  FIM - Diplomatic Services operational officer Devices: Grab bars Toilet Transfers: 3-To toilet/BSC: Mod A (lift or lower assist);3-From toilet/BSC: Mod A (lift or lower assist)  FIM - Bed/Chair Transfer Bed/Chair Transfer Assistive Devices: Manufacturing systems engineer Transfer: 4: Bed >  Chair or W/C: Min A (steadying Pt. > 75%);4: Chair or W/C > Bed: Min A (steadying Pt. > 75%)  FIM - Locomotion: Wheelchair Locomotion: Wheelchair: 0: Activity did not occur FIM - Locomotion: Ambulation Locomotion: Ambulation Assistive Devices:  (rollator) Ambulation/Gait Assistance: 4: Min assist Locomotion: Ambulation: 4: Travels 150 ft or more with minimal assistance (Pt.>75%)  Comprehension Comprehension Mode: Auditory Comprehension: 5-Understands complex 90% of the time/Cues < 10% of the time  Expression Expression Mode: Verbal Expression: 5-Expresses complex 90% of the time/cues < 10% of the time  Social Interaction Social Interaction: 5-Interacts appropriately 90% of the time - Needs monitoring or encouragement for participation or interaction.  Problem Solving Problem Solving: 4-Solves basic 75 - 89% of the time/requires cueing 10 - 24% of the time  Memory Memory: 4-Recognizes or recalls 75 - 89% of the time/requires cueing 10 - 24% of the time  Medical Problem List and Plan:  1. 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 DVT Prophylaxis/Anticoagulation: SCDs. Monitor for any signs of DVT  3. multiple sclerosis. MRI and C-spine showed no new lesions. Patient on no medication for MS prior to hospital admission  4. Diabetes mellitus. Hemoglobin A1c 10.5. Insulin aspart (novolog) 3 units 3 times a day and NovoLog 7030 22 units will increase to 25 units at breakfast, cont 20 units supper. 5. Hypertension.  bystolic 10 mg daily. Monitor with increased mobility- if persistent elevation, i will adjust regimen 6. Hyperlipidemia. Lipitor  7. Hypokalemia. Followup labs  8. Thyroid mass. Plan is to followup with Dr. Suszanne Conners for biopsy as an outpatient. Patient would need to be off Plavix x7 days prior to procedure  9. Right rotator cuff disease probable tear, OT for range of motion and scapular stabilization 10.  Back pain improved with therapy and heat and analgesic balm 11. Positive UA and malodorous urine. Patient is afebrile. Per family wishes i agreed to hold abx pending ucx. LOS (Days) 5 A FACE TO FACE EVALUATION WAS PERFORMED  Lyrah Bradt T 02/25/2012, 6:42 AM

## 2012-02-25 NOTE — Progress Notes (Signed)
Physical Therapy Session Note  Patient Details  Name: BRIGID VANDEKAMP MRN: [id number] Date of Birth: [birth year]  Today's Date: 02/25/2012  Short Term Goals: Week 1:  PT Short Term Goal 1 (Week 1): Pt will perform bed <> chair transfers with min A PT Short Term Goal 2 (Week 1): Pt will gait in controlled environment with supervision 50' PT Short Term Goal 3 (Week 1): Pt will demo dynamic standing balance for functional task with min A  Skilled Therapeutic Interventions/Progress Updates:  1:2  1005-1040  Pt stating she feels like her head isn't attached to her body today, but no complaints of pain.  Tx focused on gait training and therex for LE strengthening, muscle activation timing, and activity tolerance.  Sit<>stand with Min A, stand-pivot transfer with Min A as well,  but pt needing cues for safe descent as she attempted to sit prior to fully turning and in uncontrolled manner.  Pt ambulated 165' with Min A for steadying and improved gait quality over duration. Pt responds well, over time, with cues for posture, step length and width, and foot clearance. Decreased speed noted.  Pt performed therex on Nustep x10:30 level 3 with bil UE and LE, achieving goal of maintaining 50-60spm average. Pt needing 3 rest breaks throughout, but able to complete this activity.     2:2 14:32-15:02 Tx focused on gait training and therex for LE strengthening and activity tolerance. Gait training with min-guard/close S assist 67x140' with 4WW, with similar progress as this morning as walking went on. Pt had no LOB, but generally unsteady. Sit<>stand from standard chair with Min A for lifting once fatigued. Standing therex including all of the following bil x10 with bil UE support and cues for technique: heel raises, hip ABD, hip EXT, HS curl, mini-squats.    Therapy Documentation Precautions:  Precautions Precautions: Fall Restrictions Weight Bearing Restrictions: No   Pain: Pain Assessment Pain  Assessment: No/denies pain   Locomotion : Ambulation Ambulation/Gait Assistance: 4: Min assist  : See FIM for current functional status  Therapy/Group: Individual Therapy  Virl Cagey, PT 02/25/2012, 10:55 AM

## 2012-02-25 NOTE — Progress Notes (Signed)
Occupational Therapy Session Note  Patient Details  Name: Hannah Lamb MRN: [id number] Date of Birth: 1941-04-29  Today's Date: 02/25/2012  Time: 0900-1000  1st session Time Calculation (min): 60 min   2nd session:  Time:  1300-1335  (35 min)  Short Term Goals: Week 1:  OT Short Term Goal 1 (Week 1): Patient will complete transfers, bed <> w/c, using AE prn, with supervision. OT Short Term Goal 2 (Week 1): Patient will complete upper body dressing, at edge of bed, unsupported with supervision OT Short Term Goal 3 (Week 1): Patient will complete bathing, at sink side, w/c level with min assist OT Short Term Goal 4 (Week 1): Patient will complete toileting, using AE (bedpan) prn, with min assist for thoroughness OT Short Term Goal 5 (Week 1): Patient will demonstrate ability to complete R-UE P/SROM home exercise program with mod assist  Skilled Therapeutic Interventions/Progress Updates:     1st session:   Pt. Engaged in bathing and dressing at shower level.  Pt needed increased time to transfer to tub bench with verbal cues to turn body and line both legs next to bench.  Ppt reached feet and went from sit to stand with min assist.       2nd session:  Pt engaged in functional mobility using rollator walker and standing tolerance.  Pt. Stood to play checkers with emphasis on holding to table with one hand or using both hands to perform activity.  Pt was minimal assist with balance.  She tended to laterally flex to the left more as she stood.  She stood for 15 minutes with no rest break.     Therapy Documentation Precautions:  Precautions Precautions: Fall Restrictions Weight Bearing Restrictions: No     Pain:  None  (1st session 2nd session)       See FIM for current functional status  Therapy/Group: Individual Therapy  Humberto Seals 02/25/2012, 10:02 AM

## 2012-02-25 NOTE — Progress Notes (Signed)
Blood sugar checked at 2115 by tech and was noted to be 42. One tube of glucose gel given and recheck @ 2135 was for 103. Pt reported feeling shaky and nervous. Will continue to monitor. Verita Schneiders [place]

## 2012-02-25 NOTE — Progress Notes (Signed)
02/25/12 Patients blood pressure 190/90 Pulse 60 manual this am. Patients daughters with multiple questions about why cipro was ordered without their knowledge. Attempted to address daughters concerns without any success. Directed daughters to ask physcian about further concerns. A. Safwan Tomei,LPN.

## 2012-02-26 LAB — GLUCOSE, CAPILLARY: Glucose-Capillary: 120 mg/dL — ABNORMAL HIGH (ref 70–99)

## 2012-02-26 MED ORDER — GLUCERNA PO LIQD
237.0000 mL | ORAL | Status: DC
  Administered 2012-02-26 – 2012-02-27 (×2): 237 mL via ORAL
  Filled 2012-02-26 (×3): qty 237

## 2012-02-26 NOTE — Progress Notes (Addendum)
Subjective/Complaints: No urine culture yet. Patient has been incontinent. Sugar dropped yesterday  No pains  Review of Systems  Constitutional: Negative for fever and chills.  Respiratory: Negative for cough, sputum production and shortness of breath.   Cardiovascular: Positive for chest pain.       L lower rib pain last noc  Gastrointestinal: Positive for abdominal pain.  Musculoskeletal: Positive for joint pain.       R shoulder  All other systems reviewed and are negative.   Objective: Vital Signs: Blood pressure 162/81, pulse 63, temperature 98.3 F (36.8 C), temperature source Oral, resp. rate 17, height 5\' 7"  (1.702 m), weight 89.2 kg (196 lb 10.4 oz), SpO2 100.00%. No results found. Results for orders placed during the hospital encounter of 02/20/12 (from the past 72 hour(s))  GLUCOSE, CAPILLARY     Status: Normal   Collection Time   02/23/12 11:38 AM      Component Value Range Comment   Glucose-Capillary 81  70 - 99 mg/dL    Comment 1 Notify RN     GLUCOSE, CAPILLARY     Status: Abnormal   Collection Time   02/23/12  4:35 PM      Component Value Range Comment   Glucose-Capillary 179 (*) 70 - 99 mg/dL   GLUCOSE, CAPILLARY     Status: Abnormal   Collection Time   02/23/12  9:16 PM      Component Value Range Comment   Glucose-Capillary 103 (*) 70 - 99 mg/dL   GLUCOSE, CAPILLARY     Status: Abnormal   Collection Time   02/24/12  7:27 AM      Component Value Range Comment   Glucose-Capillary 108 (*) 70 - 99 mg/dL    Comment 1 Notify RN     URINALYSIS, ROUTINE W REFLEX MICROSCOPIC     Status: Abnormal   Collection Time   02/24/12 10:40 AM      Component Value Range Comment   Color, Urine YELLOW  YELLOW    APPearance TURBID (*) CLEAR    Specific Gravity, Urine 1.018  1.005 - 1.030    pH 5.0  5.0 - 8.0    Glucose, UA NEGATIVE  NEGATIVE mg/dL    Hgb urine dipstick LARGE (*) NEGATIVE    Bilirubin Urine NEGATIVE  NEGATIVE    Ketones, ur NEGATIVE  NEGATIVE mg/dL    Protein, ur NEGATIVE  NEGATIVE mg/dL    Urobilinogen, UA 0.2  0.0 - 1.0 mg/dL    Nitrite POSITIVE (*) NEGATIVE    Leukocytes, UA LARGE (*) NEGATIVE   URINE MICROSCOPIC-ADD ON     Status: Abnormal   Collection Time   02/24/12 10:40 AM      Component Value Range Comment   Squamous Epithelial / LPF MANY (*) RARE    WBC, UA TOO NUMEROUS TO COUNT  <3 WBC/hpf    RBC / HPF 7-10  <3 RBC/hpf    Bacteria, UA MANY (*) RARE   GLUCOSE, CAPILLARY     Status: Normal   Collection Time   02/24/12 11:33 AM      Component Value Range Comment   Glucose-Capillary 87  70 - 99 mg/dL    Comment 1 Notify RN     GLUCOSE, CAPILLARY     Status: Abnormal   Collection Time   02/24/12  4:35 PM      Component Value Range Comment   Glucose-Capillary 115 (*) 70 - 99 mg/dL   GLUCOSE, CAPILLARY  Status: Abnormal   Collection Time   02/24/12  9:35 PM      Component Value Range Comment   Glucose-Capillary 178 (*) 70 - 99 mg/dL   GLUCOSE, CAPILLARY     Status: Normal   Collection Time   02/25/12  7:35 AM      Component Value Range Comment   Glucose-Capillary 76  70 - 99 mg/dL   GLUCOSE, CAPILLARY     Status: Abnormal   Collection Time   02/25/12 11:13 AM      Component Value Range Comment   Glucose-Capillary 122 (*) 70 - 99 mg/dL   GLUCOSE, CAPILLARY     Status: Abnormal   Collection Time   02/25/12  4:21 PM      Component Value Range Comment   Glucose-Capillary 324 (*) 70 - 99 mg/dL   GLUCOSE, CAPILLARY     Status: Abnormal   Collection Time   02/25/12  8:54 PM      Component Value Range Comment   Glucose-Capillary 42 (*) 70 - 99 mg/dL    Comment 1 Notify RN     GLUCOSE, CAPILLARY     Status: Abnormal   Collection Time   02/25/12  9:27 PM      Component Value Range Comment   Glucose-Capillary 103 (*) 70 - 99 mg/dL    Comment 1 Notify RN     GLUCOSE, CAPILLARY     Status: Abnormal   Collection Time   02/26/12  1:00 AM      Component Value Range Comment   Glucose-Capillary 118 (*) 70 - 99 mg/dL     Comment 1 Notify RN       HEENT: normal Cardio: RRR Resp: CTA B/L GI: BS positive Extremity:  No Edema Skin:   Intact Neuro: Alert/Oriented, Abnormal Sensory reduced sensation to lt touch and Abnormal Motor reduced strenth on MMT Musc/Skel:  Other positive impingement R shoulder Back soreness PSIS Lower ext strength 3-/5 in L HF , Quad , TA and Gastroc 4-/5 on R side 5/5 RUE, 4/5 LUE. Overall better memory and awareness -SLR Assessment/Plan: 1. Functional deficits secondary to R basal ganglia infarct which require 3+ hours per day of interdisciplinary therapy in a comprehensive inpatient rehab setting. Physiatrist is providing close team supervision and 24 hour management of active medical problems listed below. Physiatrist and rehab team continue to assess barriers to discharge/monitor patient progress toward functional and medical goals. FIM: FIM - Bathing Bathing Steps Patient Completed: Chest;Right Arm;Left Arm;Abdomen;Front perineal area;Buttocks;Right upper leg;Left upper leg;Right lower leg (including foot);Left lower leg (including foot) Bathing: 4: Steadying assist  FIM - Upper Body Dressing/Undressing Upper body dressing/undressing steps patient completed: Thread/unthread right sleeve of pullover shirt/dresss;Thread/unthread left sleeve of pullover shirt/dress;Put head through opening of pull over shirt/dress;Pull shirt over trunk Upper body dressing/undressing: 4: Steadying assist FIM - Lower Body Dressing/Undressing Lower body dressing/undressing steps patient completed: Thread/unthread left pants leg;Thread/unthread right pants leg;Thread/unthread left underwear leg;Thread/unthread right underwear leg Lower body dressing/undressing: 3: Mod-Patient completed 50-74% of tasks  FIM - Toileting Toileting steps completed by patient: Adjust clothing prior to toileting;Adjust clothing after toileting;Performs perineal hygiene Toileting Assistive Devices: Grab bar or rail for  support Toileting: 3: Mod-Patient completed 2 of 3 steps  FIM - Diplomatic Services operational officer Devices: Grab bars Toilet Transfers: 3-To toilet/BSC: Mod A (lift or lower assist);3-From toilet/BSC: Mod A (lift or lower assist)  FIM - Banker Devices: Walker;Arm rests Bed/Chair Transfer: 4: Bed >  Chair or W/C: Min A (steadying Pt. > 75%);4: Chair or W/C > Bed: Min A (steadying Pt. > 75%)  FIM - Locomotion: Wheelchair Locomotion: Wheelchair: 0: Activity did not occur FIM - Locomotion: Ambulation Locomotion: Ambulation Assistive Devices: Designer, industrial/product Ambulation/Gait Assistance: 4: Min assist Locomotion: Ambulation: 4: Travels 150 ft or more with minimal assistance (Pt.>75%)  Comprehension Comprehension Mode: Auditory Comprehension: 6-Follows complex conversation/direction: With extra time/assistive device  Expression Expression Mode: Verbal Expression: 6-Expresses complex ideas: With extra time/assistive device  Social Interaction Social Interaction: 6-Interacts appropriately with others with medication or extra time (anti-anxiety, antidepressant).  Problem Solving Problem Solving: 5-Solves complex 90% of the time/cues < 10% of the time  Memory Memory: 4-Recognizes or recalls 75 - 89% of the time/requires cueing 10 - 24% of the time  Medical Problem List and Plan:  1. 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 DVT Prophylaxis/Anticoagulation: SCDs. Monitor for any signs of DVT  3. multiple sclerosis. MRI and C-spine showed no new lesions. Patient on no medication for MS prior to hospital admission  4. Diabetes mellitus. Hemoglobin A1c 10.5. Insulin aspart (novolog) 3 units 3 times a day and NovoLog 7030 22 units will increase to 25 units at breakfast, cont 20 units supper.  -drop yesterday likely due to rx of hyperglycemia 5.  Hypertension. bystolic 10 mg daily. BP's trended down from AM yesterday 6. Hyperlipidemia. Lipitor  7. Hypokalemia. Followup labs  8. Thyroid mass. Plan is to followup with Dr. Suszanne Conners for biopsy as an outpatient. Patient would need to be off Plavix x7 days prior to procedure  9. Right rotator cuff disease probable tear, OT for range of motion and scapular stabilization 10.  Back pain improved with therapy and heat and analgesic balm 11. Positive UA and malodorous urine. Patient is afebrile. Per family wishes i agreed to hold abx pending ucx although this was not collected yesterday. Need a specimen today. Straight cath if needed. LOS (Days) 6 A FACE TO FACE EVALUATION WAS PERFORMED  Kacy Hegna T 02/26/2012, 7:38 AM

## 2012-02-27 DIAGNOSIS — G8110 Spastic hemiplegia affecting unspecified side: Secondary | ICD-10-CM

## 2012-02-27 DIAGNOSIS — G35 Multiple sclerosis: Secondary | ICD-10-CM

## 2012-02-27 DIAGNOSIS — I6330 Cerebral infarction due to thrombosis of unspecified cerebral artery: Secondary | ICD-10-CM

## 2012-02-27 DIAGNOSIS — Z5189 Encounter for other specified aftercare: Secondary | ICD-10-CM

## 2012-02-27 LAB — GLUCOSE, CAPILLARY
Glucose-Capillary: 94 mg/dL (ref 70–99)
Glucose-Capillary: 60 mg/dL — ABNORMAL LOW (ref 70–99)
Glucose-Capillary: 62 mg/dL — ABNORMAL LOW (ref 70–99)
Glucose-Capillary: 109 mg/dL — ABNORMAL HIGH (ref 70–99)
Glucose-Capillary: 114 mg/dL — ABNORMAL HIGH (ref 70–99)
Glucose-Capillary: 167 mg/dL — ABNORMAL HIGH (ref 70–99)
Glucose-Capillary: 206 mg/dL — ABNORMAL HIGH (ref 70–99)

## 2012-02-27 MED ORDER — INSULIN ASPART PROT & ASPART (70-30 MIX) 100 UNIT/ML ~~LOC~~ SUSP
25.0000 [IU] | Freq: Every day | SUBCUTANEOUS | Status: DC
  Administered 2012-02-28 – 2012-02-29 (×2): 25 [IU] via SUBCUTANEOUS
  Filled 2012-02-27: qty 3

## 2012-02-27 MED ORDER — INSULIN ASPART PROT & ASPART (70-30 MIX) 100 UNIT/ML ~~LOC~~ SUSP
27.0000 [IU] | Freq: Every day | SUBCUTANEOUS | Status: DC
  Administered 2012-02-27: 27 [IU] via SUBCUTANEOUS

## 2012-02-27 NOTE — Significant Event (Signed)
CBG: 60  Treatment: 15 GM carbohydrate snack  Symptoms: Nervous/irritable  Follow-up CBG: Time:1212 CBG Result:54   Possible Reasons for Event: Medication regimen:   Comments/MD notified: Harvel Ricks, PA notified   Hedy Camara

## 2012-02-27 NOTE — Progress Notes (Signed)
Patient ID: WATEEN VARON, female   DOB: 1941-09-02, 71 y.o.   MRN: [id number]  Subjective/Complaints: No urine culture yet. Patient has been incontinent. Sugar dropped yesterday  No pains  Review of Systems  Constitutional: Negative for fever and chills.  Respiratory: Negative for cough, sputum production and shortness of breath.   Cardiovascular: Positive for chest pain.       L lower rib pain last noc  Gastrointestinal: Positive for abdominal pain.  Musculoskeletal: Positive for joint pain.       R shoulder  All other systems reviewed and are negative.   Objective: Vital Signs: Blood pressure 186/78, pulse 58, temperature 97.5 F (36.4 C), temperature source Oral, resp. rate 16, height 5\' 7"  (1.702 m), weight 89.2 kg (196 lb 10.4 oz), SpO2 97.00%. No results found. Results for orders placed during the hospital encounter of 02/20/12 (from the past 72 hour(s))  URINALYSIS, ROUTINE W REFLEX MICROSCOPIC     Status: Abnormal   Collection Time   02/24/12 10:40 AM      Component Value Range Comment   Color, Urine YELLOW  YELLOW    APPearance TURBID (*) CLEAR    Specific Gravity, Urine 1.018  1.005 - 1.030    pH 5.0  5.0 - 8.0    Glucose, UA NEGATIVE  NEGATIVE mg/dL    Hgb urine dipstick LARGE (*) NEGATIVE    Bilirubin Urine NEGATIVE  NEGATIVE    Ketones, ur NEGATIVE  NEGATIVE mg/dL    Protein, ur NEGATIVE  NEGATIVE mg/dL    Urobilinogen, UA 0.2  0.0 - 1.0 mg/dL    Nitrite POSITIVE (*) NEGATIVE    Leukocytes, UA LARGE (*) NEGATIVE   URINE MICROSCOPIC-ADD ON     Status: Abnormal   Collection Time   02/24/12 10:40 AM      Component Value Range Comment   Squamous Epithelial / LPF MANY (*) RARE    WBC, UA TOO NUMEROUS TO COUNT  <3 WBC/hpf    RBC / HPF 7-10  <3 RBC/hpf    Bacteria, UA MANY (*) RARE   GLUCOSE, CAPILLARY     Status: Normal   Collection Time   02/24/12 11:33 AM      Component Value Range Comment   Glucose-Capillary 87  70 - 99 mg/dL    Comment 1 Notify RN       GLUCOSE, CAPILLARY     Status: Abnormal   Collection Time   02/24/12  4:35 PM      Component Value Range Comment   Glucose-Capillary 115 (*) 70 - 99 mg/dL   GLUCOSE, CAPILLARY     Status: Abnormal   Collection Time   02/24/12  9:35 PM      Component Value Range Comment   Glucose-Capillary 178 (*) 70 - 99 mg/dL   GLUCOSE, CAPILLARY     Status: Normal   Collection Time   02/25/12  7:35 AM      Component Value Range Comment   Glucose-Capillary 76  70 - 99 mg/dL   GLUCOSE, CAPILLARY     Status: Abnormal   Collection Time   02/25/12 11:13 AM      Component Value Range Comment   Glucose-Capillary 122 (*) 70 - 99 mg/dL   GLUCOSE, CAPILLARY     Status: Abnormal   Collection Time   02/25/12  4:21 PM      Component Value Range Comment   Glucose-Capillary 324 (*) 70 - 99 mg/dL   GLUCOSE, CAPILLARY  Status: Abnormal   Collection Time   02/25/12  8:54 PM      Component Value Range Comment   Glucose-Capillary 42 (*) 70 - 99 mg/dL    Comment 1 Notify RN     GLUCOSE, CAPILLARY     Status: Abnormal   Collection Time   02/25/12  9:27 PM      Component Value Range Comment   Glucose-Capillary 103 (*) 70 - 99 mg/dL    Comment 1 Notify RN     GLUCOSE, CAPILLARY     Status: Abnormal   Collection Time   02/26/12  1:00 AM      Component Value Range Comment   Glucose-Capillary 118 (*) 70 - 99 mg/dL    Comment 1 Notify RN     GLUCOSE, CAPILLARY     Status: Abnormal   Collection Time   02/26/12  7:58 AM      Component Value Range Comment   Glucose-Capillary 116 (*) 70 - 99 mg/dL   GLUCOSE, CAPILLARY     Status: Abnormal   Collection Time   02/26/12 11:29 AM      Component Value Range Comment   Glucose-Capillary 208 (*) 70 - 99 mg/dL   GLUCOSE, CAPILLARY     Status: Abnormal   Collection Time   02/26/12  4:23 PM      Component Value Range Comment   Glucose-Capillary 260 (*) 70 - 99 mg/dL   GLUCOSE, CAPILLARY     Status: Abnormal   Collection Time   02/26/12  8:39 PM      Component Value  Range Comment   Glucose-Capillary 120 (*) 70 - 99 mg/dL    Comment 1 Notify RN     GLUCOSE, CAPILLARY     Status: Normal   Collection Time   02/27/12  7:07 AM      Component Value Range Comment   Glucose-Capillary 94  70 - 99 mg/dL    Comment 1 Notify RN       HEENT: normal Cardio: RRR Resp: CTA B/L GI: BS positive Extremity:  No Edema Skin:   Intact Neuro: Alert/Oriented, Abnormal Sensory reduced sensation to lt touch and Abnormal Motor reduced strenth on MMT Musc/Skel:  Other positive impingement R shoulder Back soreness PSIS Lower ext strength 3-/5 in L HF , Quad , TA and Gastroc 4-/5 on R side 5/5 RUE, 4/5 LUE. Overall better memory and awareness -SLR Assessment/Plan: 1. Functional deficits secondary to R basal ganglia infarct which require 3+ hours per day of interdisciplinary therapy in a comprehensive inpatient rehab setting. Physiatrist is providing close team supervision and 24 hour management of active medical problems listed below. Physiatrist and rehab team continue to assess barriers to discharge/monitor patient progress toward functional and medical goals. FIM: FIM - Bathing Bathing Steps Patient Completed: Chest;Right Arm;Left Arm;Abdomen;Front perineal area;Buttocks;Right upper leg;Left upper leg;Right lower leg (including foot);Left lower leg (including foot) Bathing: 4: Steadying assist  FIM - Upper Body Dressing/Undressing Upper body dressing/undressing steps patient completed: Thread/unthread right sleeve of pullover shirt/dresss;Thread/unthread left sleeve of pullover shirt/dress;Put head through opening of pull over shirt/dress;Pull shirt over trunk Upper body dressing/undressing: 5: Set-up assist to: Obtain clothing/put away FIM - Lower Body Dressing/Undressing Lower body dressing/undressing steps patient completed: Thread/unthread right underwear leg;Thread/unthread left underwear leg;Pull underwear up/down;Thread/unthread left pants leg;Pull pants  up/down;Don/Doff right sock;Don/Doff left sock Lower body dressing/undressing: 4: Min-Patient completed 75 plus % of tasks  FIM - Toileting Toileting steps completed by patient: Adjust clothing prior to  toileting;Performs perineal hygiene;Adjust clothing after toileting Toileting Assistive Devices: Grab bar or rail for support Toileting: 4: Steadying assist  FIM - Diplomatic Services operational officer Devices: Grab bars Toilet Transfers: 4-To toilet/BSC: Min A (steadying Pt. > 75%);4-From toilet/BSC: Min A (steadying Pt. > 75%)  FIM - Banker Devices: Walker;Arm rests Bed/Chair Transfer: 4: Bed > Chair or W/C: Min A (steadying Pt. > 75%);4: Chair or W/C > Bed: Min A (steadying Pt. > 75%)  FIM - Locomotion: Wheelchair Locomotion: Wheelchair: 0: Activity did not occur FIM - Locomotion: Ambulation Locomotion: Ambulation Assistive Devices: Designer, industrial/product Ambulation/Gait Assistance: 4: Min assist Locomotion: Ambulation: 4: Travels 150 ft or more with minimal assistance (Pt.>75%)  Comprehension Comprehension Mode: Auditory Comprehension: 6-Follows complex conversation/direction: With extra time/assistive device  Expression Expression Mode: Verbal Expression: 6-Expresses complex ideas: With extra time/assistive device  Social Interaction Social Interaction: 6-Interacts appropriately with others with medication or extra time (anti-anxiety, antidepressant).  Problem Solving Problem Solving: 6-Solves complex problems: With extra time  Memory Memory: 6-More than reasonable amt of time  Medical Problem List and Plan:  1. 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 DVT Prophylaxis/Anticoagulation: SCDs. Monitor for any signs of DVT  3. multiple sclerosis. MRI and C-spine showed no new lesions. Patient on no medication for MS prior to hospital  admission  4. Diabetes mellitus. Hemoglobin A1c 10.5. Insulin aspart (novolog) 3 units 3 times a day and NovoLog 7030  25 units at breakfast, cont 17 units supper.  -drop 6/15 likely due to rx of hyperglycemia, need to raise am 70/30 5. Hypertension. bystolic 10 mg daily. BP's trended down from AM yesterday 6. Hyperlipidemia. Lipitor  7. Hypokalemia. Followup labs  8. Thyroid mass. Plan is to followup with Dr. Suszanne Conners for biopsy as an outpatient. Patient would need to be off Plavix x7 days prior to procedure  9. Right rotator cuff disease probable tear, OT for range of motion and scapular stabilization 10.  Back pain improved with therapy and heat and analgesic balm 11. Positive UA and malodorous urine. Patient is afebrile. Per family wishes i agreed to hold abx pending ucx although this was not collected yesterday. Need a specimen today. Straight cath if needed. LOS (Days) 7 A FACE TO FACE EVALUATION WAS PERFORMED  Johnnathan Hagemeister E 02/27/2012, 7:46 AM

## 2012-02-27 NOTE — Progress Notes (Signed)
Physical Therapy Weekly Progress Note  Patient Details  Name: Hannah Lamb MRN: [id number] Date of Birth: 02-04-1941  Today's Date: 02/27/2012 Time: 15:05-15:45 -   Patient has met 3 of 3 short term goals. Pt currently requires min assist for transfers for sit to stand component and close supervision for gait in controlled environment, min assist on steps. Pt is using a rollator walker from home safely, however the brakes are broken. Family was provided with information on where to take walker to get it fixed prior to discharge.   Patient continues to demonstrate the following deficits: weakness, motor control, balance, activity tolerance and therefore will continue to benefit from skilled PT intervention to enhance overall performance with activity tolerance, balance, postural control and ability to compensate for deficits.  Patient progressing toward long term goals..  Continue plan of care.  PT Short Term Goals Week 1:  PT Short Term Goal 1 (Week 1): Pt will perform bed <> chair transfers with min A PT Short Term Goal 1 - Progress (Week 1): Met PT Short Term Goal 2 (Week 1): Pt will gait in controlled environment with supervision 50' PT Short Term Goal 2 - Progress (Week 1): Met PT Short Term Goal 3 (Week 1): Pt will demo dynamic standing balance for functional task with min A PT Short Term Goal 3 - Progress (Week 1): Met      Therapy Documentation Precautions:  Precautions Precautions: Fall Restrictions Weight Bearing Restrictions: No General:   Vital Signs: Therapy Vitals Temp: 98.7 F (37.1 C) Temp src: Oral Pulse Rate: 70  Resp: 18  BP: 185/73 mmHg Patient Position, if appropriate: Sitting Oxygen Therapy SpO2: 100 % O2 Device: None (Room air) Pain: Pain Assessment Pain Assessment: No/denies pain Mobility: Bed Mobility Rolling Right: 5: Supervision Rolling Right Details: Visual cues/gestures for sequencing Supine to Sit: 5: Supervision Transfers Sit to  Stand: 4: Min assist Sit to Stand Details (indicate cue type and reason): vc for foot placement. Stand to Sit: 4: Min guard Stand Pivot Transfers: 4: Min Orthoptist Transfers: Not tested (comment) Locomotion : Ambulation Ambulation: Yes Ambulation/Gait Assistance: 4: Min guard Ambulation Distance (Feet): 150 Feet Assistive device: 4-wheeled walker Ambulation/Gait Assistance Details: Verbal cues for gait pattern Gait Gait Pattern: Decreased step length - left;Step-through pattern;Lateral hip instability;Lateral trunk lean to left Stairs / Additional Locomotion Stairs: Yes Stairs Assistance: 4: Min guard Stair Management Technique: Two rails Number of Stairs: 12  Ramp: Not tested (comment) Curb: Not tested (comment) Wheelchair Mobility Wheelchair Mobility: No             Therapy/Group: Individual Therapy  [name] 02/27/2012, 4:12 PM

## 2012-02-27 NOTE — Plan of Care (Signed)
Problem: RH Balance Goal: LTG: Patient will maintain dynamic sitting balance (OT) LTG: Patient will maintain dynamic sitting balance with assistance during activities of daily living (OT)  Downgraded 02/27/12  Problem: RH Bathing Goal: LTG Patient will bathe with assist, cues/equipment (OT) LTG: Patient will bathe specified number of body parts with assist with/without cues using equipment (position) (OT)  Downgraded 02/27/12  Problem: RH Toileting Goal: LTG Patient will perform toileting w/assist, cues/equip (OT) LTG: Patient will perform toiletiing (clothes management/hygiene) with assist, with/without cues using equipment (OT)  Downgraded 02/27/12  Problem: RH Simple Meal Prep Goal: LTG Patient will perform simple meal prep w/assist (OT) LTG: Patient will perform simple meal prep with assistance, with/without cues (OT).  Downgraded 02/27/12  Problem: RH Toilet Transfers Goal: LTG Patient will perform toilet transfers w/assist (OT) LTG: Patient will perform toilet transfers with assist, with/without cues using equipment (OT)  Downgraded 02/27/12  Problem: RH Tub/Shower Transfers Goal: LTG Patient will perform tub/shower transfers w/assist (OT) LTG: Patient will perform tub/shower transfers with assist, with/without cues using equipment (OT)  Downgraded 02/27/12

## 2012-02-27 NOTE — Progress Notes (Signed)
Occupational Therapy Session Note  Patient Details  Name: TRIXY LOYOLA MRN: [id number] Date of Birth: 1941-08-10  Today's Date: 02/27/2012 Time: 0930-1030 and 1330-1415 Time Calculation (min): 60 min and 45 min  Short Term Goals: Week 1:  OT Short Term Goal 1 (Week 1): Patient will complete transfers, bed <> w/c, using AE prn, with supervision. OT Short Term Goal 2 (Week 1): Patient will complete upper body dressing, at edge of bed, unsupported with supervision OT Short Term Goal 3 (Week 1): Patient will complete bathing, at sink side, w/c level with min assist OT Short Term Goal 4 (Week 1): Patient will complete toileting, using AE (bedpan) prn, with min assist for thoroughness OT Short Term Goal 5 (Week 1): Patient will demonstrate ability to complete R-UE P/SROM home exercise program with mod assist  Skilled Therapeutic Interventions/Progress Updates:    1) Pt seen for ADL retraining with focus on use of Rolator walker with sit <> stand, toilet, and tub bench transfers in walk-in shower.  Pt required increased time initially during session for bed mobility and initial sit to stand.  Pt reports feeling slow in the AM, more so after taking her Plavix - encouraged pt to discuss concerns with MD and informed RN.  Pt min assist ambulation into bathroom with Rolator, close supervision with sit to stand and standing in shower with bathing of periarea.  Cues for pt to don LLE first to increase independence with LB dressing.  2) 1:1 OT with focus on transfers, sit <> stand, and BUE strengthening.  Pt with low blood sugar this session, therefore activities restricted to room secondary to pt feeling a little "off".  Pt required mod assist initially sit to stand from recliner secondary to lower surface and reports of decreased strength.  Toilet transfer with Rolator walker with min assist and steady assist with toileting (hygiene and clothing management).  Completed hand hygiene in standing with contact  guard.  BUE strengthening with 2# dowel rod with 3 reps of 10 chest presses and bicep curls with focus on upright seated posture and relaxing of Rt shoulder with movements.  Therapy Documentation Precautions:  Precautions Precautions: Fall Restrictions Weight Bearing Restrictions: No Pain: Pain Assessment Pain Assessment: No/denies pain  See FIM for current functional status  Therapy/Group: Individual Therapy  Leonette Monarch 02/27/2012, 11:05 AM

## 2012-02-27 NOTE — Progress Notes (Signed)
Physical Therapy Note  Patient Details  Name: Hannah Lamb MRN: [id number] Date of Birth: 1941/03/08 Today's Date: 02/27/2012  11:15-12:15 individual therapy pt complained of feeling foggy but no complaints of pain.  Daughter, May, was present and had questions about strength and function. Discussed MS, CVA, and the need for continuous activity to regain strength. Pt's legs were more swollen. TED hose were applied and pt was assisted to the recliner. Pt's blood sugar was low, so exercise was not performed. Answered questions about power wheelchairs.   [name] 02/27/2012, 12:22 PM

## 2012-02-27 NOTE — Significant Event (Signed)
CBG: 49  Treatment: 15 GM carbohydrate snack  Symptoms: Nervous/irritable  Follow-up CBG: Time:1342 CBG Result:62  Possible Reasons for Event: Medication regimen:   Comments/MD notified: Yes    Hedy Camara

## 2012-02-27 NOTE — Progress Notes (Signed)
Nutrition Follow-up  RD contacted by RN regarding pt's frequent low blood sugars. Per RN, pt with persistent low blood sugars during the weekend and today. Per physician's note, morning NovoLog 70/30 was adjusted to 25 units.  Ensure Complete supplement was discontinued on 6/16 and Glucerna Shake supplements was ordered instead 2/2 pt's dx of DM.  RN changed diet order to CHO Modified High (was CHO Modified Medium) after discussing pt's hypoglycemic episodes with this RD and PA.  Discussed intake with pt and family. Pt would like to d/c Glucerna and instead choose snacks between meals.  Diet Order:  CHO Modified High (>2000 kcal daily, 75 - 90 grams CHO per meal)  Meds: Scheduled Meds:   . clopidogrel  75 mg Oral Q breakfast  . Glucerna  237 mL Oral 1 day or 1 dose  . insulin aspart  0-8 Units Subcutaneous TID WC  . insulin aspart protamine-insulin aspart  17 Units Subcutaneous Q supper  . insulin aspart protamine-insulin aspart  25 Units Subcutaneous Q breakfast  . Muscle Rub   Topical BID  . nebivolol  10 mg Oral Daily  . potassium chloride  20 mEq Oral Daily  . DISCONTD: insulin aspart protamine-insulin aspart  25 Units Subcutaneous Q breakfast  . DISCONTD: insulin aspart protamine-insulin aspart  27 Units Subcutaneous Q breakfast   Continuous Infusions:  PRN Meds:.acetaminophen, ondansetron (ZOFRAN) IV, ondansetron, polyethylene glycol, sorbitol  Labs:  CMP     Component Value Date/Time   NA 143 02/21/2012 0630   K 3.7 02/21/2012 0630   CL 106 02/21/2012 0630   CO2 25 02/21/2012 0630   GLUCOSE 64* 02/21/2012 0630   BUN 13 02/21/2012 0630   CREATININE 0.65 02/21/2012 0630   CALCIUM 9.0 02/21/2012 0630   PROT 6.7 02/21/2012 0630   ALBUMIN 2.9* 02/21/2012 0630   AST 16 02/21/2012 0630   ALT 9 02/21/2012 0630   ALKPHOS 47 02/21/2012 0630   BILITOT 0.5 02/21/2012 0630   GFRNONAA 87* 02/21/2012 0630   GFRAA >90 02/21/2012 0630   CBG (last 3)   Basename 02/27/12 1341 02/27/12 1248  02/27/12 1209  GLUCAP 62* 49* 54*    Lab Results  Component Value Date   HGBA1C 10.5* 02/16/2012     Intake/Output Summary (Last 24 hours) at 02/27/12 1426 Last data filed at 02/27/12 0900  Gross per 24 hour  Intake    600 ml  Output      0 ml  Net    600 ml    Weight Status:  89.2 kg - stable  Re-estimated needs:  1750 - 1950 kcal, 62 - 74 grams protein, 1.9 - 2 liters daily  Nutrition Dx:  Increased nutrient needs - ongoing.  Goal:  Pt to consume at least 75% of meals and supplements  Intervention:   1. Discontinue Glucerna Shakes 2. Add snacks at 10 am, 2pm, and HS, per pt request 3. If pt continues to have problems with hypoglycemia, recommend c/s to diabetes coordinator 4. RD to continue to follow nutrition care plan  Monitor:  weights, labs, PO intake, I/O's  Adair Laundry Pager #:  [phone]

## 2012-02-27 NOTE — Significant Event (Signed)
CBG: 62 Treatment: 15 GM carbohydrate snack  Symptoms: none  Follow-up CBG: Time:1452 CBG Result:109  Possible Reasons for Event: Medication regimen:   Comments/MD notified:Yes    Hedy Camara

## 2012-02-27 NOTE — Significant Event (Signed)
CBG: 54  Treatment: 15 GM carbohydrate snack  Symptoms: Nervous/irritable  Follow-up CBG: Time:1245 CBG Result:49 Possible Reasons for Event: Medication regimen:   Comments/MD notified:Yes    Hedy Camara

## 2012-02-28 DIAGNOSIS — G35 Multiple sclerosis: Secondary | ICD-10-CM

## 2012-02-28 DIAGNOSIS — I6330 Cerebral infarction due to thrombosis of unspecified cerebral artery: Secondary | ICD-10-CM

## 2012-02-28 DIAGNOSIS — G8110 Spastic hemiplegia affecting unspecified side: Secondary | ICD-10-CM

## 2012-02-28 DIAGNOSIS — Z5189 Encounter for other specified aftercare: Secondary | ICD-10-CM

## 2012-02-28 LAB — GLUCOSE, CAPILLARY
Glucose-Capillary: 147 mg/dL — ABNORMAL HIGH (ref 70–99)
Glucose-Capillary: 189 mg/dL — ABNORMAL HIGH (ref 70–99)
Glucose-Capillary: 148 mg/dL — ABNORMAL HIGH (ref 70–99)

## 2012-02-28 LAB — URINE CULTURE
Colony Count: >=100000
Culture  Setup Time: 201306161549

## 2012-02-28 MED ORDER — CLONIDINE HCL 0.1 MG PO TABS
0.1000 mg | ORAL_TABLET | Freq: Three times a day (TID) | ORAL | Status: DC | PRN
  Filled 2012-02-28: qty 1

## 2012-02-28 MED ORDER — CLOPIDOGREL BISULFATE 75 MG PO TABS: 75.0000 mg | ORAL_TABLET | ORAL | Status: DC

## 2012-02-28 MED ORDER — CLOPIDOGREL BISULFATE 75 MG PO TABS
75.0000 mg | ORAL_TABLET | Freq: Every day | ORAL | Status: DC
  Administered 2012-02-28 – 2012-03-04 (×5): 75 mg via ORAL
  Filled 2012-02-28 (×8): qty 1

## 2012-02-28 MED ORDER — CEPHALEXIN 250 MG PO CAPS
250.0000 mg | ORAL_CAPSULE | Freq: Three times a day (TID) | ORAL | Status: AC
  Administered 2012-02-28 – 2012-03-03 (×10): 250 mg via ORAL
  Filled 2012-02-28 (×15): qty 1

## 2012-02-28 NOTE — Progress Notes (Signed)
Patient ID: Hannah Lamb, female   DOB: 02-Apr-1941, 71 y.o.   MRN: [id number]  Subjective/Complaints: No urine culture yet. Patient has been incontinent. Sugar dropped yesterday  No pains  Review of Systems  Constitutional: Negative for fever and chills.  Respiratory: Negative for cough, sputum production and shortness of breath.   Cardiovascular: Positive for chest pain.       L lower rib pain last noc  Gastrointestinal: Positive for abdominal pain.  Musculoskeletal: Positive for joint pain.       R shoulder  All other systems reviewed and are negative.   Objective: Vital Signs: Blood pressure 181/92, pulse 65, temperature 98.1 F (36.7 C), temperature source Oral, resp. rate 17, height 5\' 7"  (1.702 m), weight 95.4 kg (210 lb 5.1 oz), SpO2 94.00%. No results found. Results for orders placed during the hospital encounter of 02/20/12 (from the past 72 hour(s))  GLUCOSE, CAPILLARY     Status: Abnormal   Collection Time   02/25/12 11:13 AM      Component Value Range Comment   Glucose-Capillary 122 (*) 70 - 99 mg/dL   GLUCOSE, CAPILLARY     Status: Abnormal   Collection Time   02/25/12  4:21 PM      Component Value Range Comment   Glucose-Capillary 324 (*) 70 - 99 mg/dL   GLUCOSE, CAPILLARY     Status: Abnormal   Collection Time   02/25/12  8:54 PM      Component Value Range Comment   Glucose-Capillary 42 (*) 70 - 99 mg/dL    Comment 1 Notify RN     GLUCOSE, CAPILLARY     Status: Abnormal   Collection Time   02/25/12  9:27 PM      Component Value Range Comment   Glucose-Capillary 103 (*) 70 - 99 mg/dL    Comment 1 Notify RN     GLUCOSE, CAPILLARY     Status: Abnormal   Collection Time   02/26/12  1:00 AM      Component Value Range Comment   Glucose-Capillary 118 (*) 70 - 99 mg/dL    Comment 1 Notify RN     GLUCOSE, CAPILLARY     Status: Abnormal   Collection Time   02/26/12  7:58 AM      Component Value Range Comment   Glucose-Capillary 116 (*) 70 - 99 mg/dL   URINE  CULTURE     Status: Normal   Collection Time   02/26/12  9:59 AM      Component Value Range Comment   Specimen Description URINE, CLEAN CATCH      Special Requests NONE      Culture  Setup Time [id number]      Colony Count >=100,000 COLONIES/ML      Culture KLEBSIELLA PNEUMONIAE      Report Status 02/28/2012 FINAL      Organism ID, Bacteria KLEBSIELLA PNEUMONIAE     GLUCOSE, CAPILLARY     Status: Abnormal   Collection Time   02/26/12 11:29 AM      Component Value Range Comment   Glucose-Capillary 208 (*) 70 - 99 mg/dL   GLUCOSE, CAPILLARY     Status: Abnormal   Collection Time   02/26/12  4:23 PM      Component Value Range Comment   Glucose-Capillary 260 (*) 70 - 99 mg/dL   GLUCOSE, CAPILLARY     Status: Abnormal   Collection Time   02/26/12  8:39 PM  Component Value Range Comment   Glucose-Capillary 120 (*) 70 - 99 mg/dL    Comment 1 Notify RN     GLUCOSE, CAPILLARY     Status: Normal   Collection Time   02/27/12  7:07 AM      Component Value Range Comment   Glucose-Capillary 94  70 - 99 mg/dL    Comment 1 Notify RN     GLUCOSE, CAPILLARY     Status: Abnormal   Collection Time   02/27/12 11:32 AM      Component Value Range Comment   Glucose-Capillary 60 (*) 70 - 99 mg/dL   GLUCOSE, CAPILLARY     Status: Abnormal   Collection Time   02/27/12 12:09 PM      Component Value Range Comment   Glucose-Capillary 54 (*) 70 - 99 mg/dL   GLUCOSE, CAPILLARY     Status: Abnormal   Collection Time   02/27/12 12:48 PM      Component Value Range Comment   Glucose-Capillary 49 (*) 70 - 99 mg/dL    Comment 1 Notify RN     GLUCOSE, CAPILLARY     Status: Abnormal   Collection Time   02/27/12  1:41 PM      Component Value Range Comment   Glucose-Capillary 62 (*) 70 - 99 mg/dL   GLUCOSE, CAPILLARY     Status: Abnormal   Collection Time   02/27/12  2:51 PM      Component Value Range Comment   Glucose-Capillary 109 (*) 70 - 99 mg/dL   GLUCOSE, CAPILLARY     Status: Abnormal    Collection Time   02/27/12  4:30 PM      Component Value Range Comment   Glucose-Capillary 114 (*) 70 - 99 mg/dL   GLUCOSE, CAPILLARY     Status: Abnormal   Collection Time   02/27/12  8:22 PM      Component Value Range Comment   Glucose-Capillary 167 (*) 70 - 99 mg/dL   GLUCOSE, CAPILLARY     Status: Abnormal   Collection Time   02/27/12  9:23 PM      Component Value Range Comment   Glucose-Capillary 206 (*) 70 - 99 mg/dL   GLUCOSE, CAPILLARY     Status: Abnormal   Collection Time   02/28/12  7:25 AM      Component Value Range Comment   Glucose-Capillary 145 (*) 70 - 99 mg/dL    Comment 1 Notify RN       HEENT: normal Cardio: RRR Resp: CTA B/L GI: BS positive Extremity:  No Edema Skin:   Intact Neuro: Alert/Oriented, Abnormal Sensory reduced sensation to lt touch and Abnormal Motor reduced strenth on MMT Musc/Skel:  Other positive impingement R shoulder Back soreness PSIS Lower ext strength 3-/5 in L HF , Quad , TA and Gastroc 4-/5 on R side 5/5 RUE, 4/5 LUE. Overall better memory and awareness -SLR Assessment/Plan: 1. Functional deficits secondary to R basal ganglia infarct which require 3+ hours per day of interdisciplinary therapy in a comprehensive inpatient rehab setting. Physiatrist is providing close team supervision and 24 hour management of active medical problems listed below. Physiatrist and rehab team continue to assess barriers to discharge/monitor patient progress toward functional and medical goals. FIM: FIM - Bathing Bathing Steps Patient Completed: Chest;Right Arm;Left Arm;Abdomen;Front perineal area;Buttocks;Right upper leg;Left upper leg;Right lower leg (including foot);Left lower leg (including foot) Bathing: 4: Steadying assist  FIM - Upper Body Dressing/Undressing Upper body dressing/undressing steps patient  completed: Thread/unthread right sleeve of pullover shirt/dresss;Thread/unthread left sleeve of pullover shirt/dress;Put head through opening of  pull over shirt/dress;Pull shirt over trunk Upper body dressing/undressing: 5: Set-up assist to: Obtain clothing/put away FIM - Lower Body Dressing/Undressing Lower body dressing/undressing steps patient completed: Thread/unthread right underwear leg;Thread/unthread left underwear leg;Pull underwear up/down;Thread/unthread right pants leg;Thread/unthread left pants leg;Pull pants up/down;Don/Doff right sock;Don/Doff left sock Lower body dressing/undressing: 5: Set-up assist to: Obtain clothing  FIM - Toileting Toileting steps completed by patient: Adjust clothing prior to toileting Toileting Assistive Devices: Grab bar or rail for support Toileting: 4: Steadying assist  FIM - Diplomatic Services operational officer Devices: Grab bars Toilet Transfers: 4-To toilet/BSC: Min A (steadying Pt. > 75%);4-From toilet/BSC: Min A (steadying Pt. > 75%)  FIM - Banker Devices:  (rollator) Bed/Chair Transfer: 4: Bed > Chair or W/C: Min A (steadying Pt. > 75%);4: Chair or W/C > Bed: Min A (steadying Pt. > 75%)  FIM - Locomotion: Wheelchair Locomotion: Wheelchair: 0: Activity did not occur FIM - Locomotion: Ambulation Locomotion: Ambulation Assistive Devices:  (rollator walker) Ambulation/Gait Assistance: 5: Supervision Locomotion: Ambulation: 5: Travels 150 ft or more with supervision/safety issues  Comprehension Comprehension Mode: Auditory Comprehension: 5-Understands basic 90% of the time/requires cueing < 10% of the time  Expression Expression Mode: Verbal Expression: 5-Expresses complex 90% of the time/cues < 10% of the time  Social Interaction Social Interaction: 6-Interacts appropriately with others with medication or extra time (anti-anxiety, antidepressant).  Problem Solving Problem Solving: 5-Solves basic 90% of the time/requires cueing < 10% of the time  Memory Memory: 5-Recognizes or recalls 90% of the time/requires cueing < 10% of  the time  Medical Problem List and Plan:  1. 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 DVT Prophylaxis/Anticoagulation: SCDs. Monitor for any signs of DVT  3. multiple sclerosis. MRI and C-spine showed no new lesions. Patient on no medication for MS prior to hospital admission  4. Diabetes mellitus. Hemoglobin A1c 10.5. Insulin aspart (novolog) 3 units 3 times a day and NovoLog 7030  25 units at breakfast, cont 17 units supper.  -drop 6/15 likely due to rx of hyperglycemia, need to raise am 70/30 5. Hypertension. bystolic 10 mg daily. BP's trended down from AM yesterday 6. Hyperlipidemia. Lipitor  7. Hypokalemia. Followup labs  8. Thyroid mass. Plan is to followup with Dr. Suszanne Conners for biopsy as an outpatient. Patient would need to be off Plavix x7 days prior to procedure  9. Right rotator cuff disease probable tear, OT for range of motion and scapular stabilization 10.  Back pain improved with therapy and heat and analgesic balm 11. Positive UA and malodorous urine. Patient is afebrile. Cx + Kleb add keflexLOS (Days) 8 A FACE TO FACE EVALUATION WAS PERFORMED  Machelle Raybon E 02/28/2012, 8:01 AM

## 2012-02-28 NOTE — Progress Notes (Signed)
Per State Regulation 482.30 This chart was reviewed for medical necessity with respect to the patient's Admission/Duration of stay. Pt participating in therapies and progressing toward goals. DM uncontrolled, RD consult yesterday, diet changed.  On Keflex for UTI.  Meryl Dare                 Nurse Care Manager             Next Review Date: 03/02/12

## 2012-02-28 NOTE — Progress Notes (Signed)
Occupational Therapy Weekly Progress Note  Patient Details  Name: Hannah Lamb MRN: [id number] Date of Birth: [birth year]  Today's Date: 02/28/2012  Patient has met 4 of 5 short term goals.  Pt has demonstrated increased independence with bathing, dressing, and  transfers with Rolator walker.  Pt has progressed towards bathing at tub/shower level and toileting with min/steady assist at toilet level.  Pt requires increased time with sit to stand, yet is supervision overall with sit to stand and transfers.  Patient continues to demonstrate the following deficits: decreased LUE proprioception, cues for sequencing, decreased dynamic standing balance and therefore will continue to benefit from skilled OT intervention to enhance overall performance with BADL and Reduce care partner burden.  Patient progressing toward long term goals..  Continue plan of care.  OT Short Term Goals Week 1:  OT Short Term Goal 1 (Week 1): Patient will complete transfers, bed <> w/c, using AE prn, with supervision. OT Short Term Goal 1 - Progress (Week 1): Met OT Short Term Goal 2 (Week 1): Patient will complete upper body dressing, at edge of bed, unsupported with supervision OT Short Term Goal 2 - Progress (Week 1): Met OT Short Term Goal 3 (Week 1): Patient will complete bathing, at sink side, w/c level with min assist OT Short Term Goal 3 - Progress (Week 1): Met OT Short Term Goal 4 (Week 1): Patient will complete toileting, using AE (bedpan) prn, with min assist for thoroughness OT Short Term Goal 4 - Progress (Week 1): Met OT Short Term Goal 5 (Week 1): Patient will demonstrate ability to complete R-UE P/SROM home exercise program with mod assist OT Short Term Goal 5 - Progress (Week 1): Progressing toward goal Week 2:  OT Short Term Goal 1 (Week 2): STG = LTG due to remaining LOS [name], [name] 02/28/2012, 3:58 PM

## 2012-02-28 NOTE — Progress Notes (Signed)
Patient noted with blood pressure elevated at shift change at 1530 . Rechecked blood pressure for 180/100 at  1700. Patient refused plavix dose at 1700 due to unknown  Side effects  . Reviewed in detail medication and side effects and rationale for plavix. Patient took plavix after encouragement . 1720 blood pressure rechecked for 200/90 . 1725 D. Anguilli PA notified no new orders at this time. Emotional support given to patient and daughter . Blood pressure rechecked again at 1830 for 170/92. Patient less anxious and verbalized less anxiety . Patient reported she would not continue keflex and reported keflex side effect making her blood pressure high. Reviewed side effects of keflex with patient and daughter . Patient continued to refuse remaining doses. Next shift LPN aware of patient blood pressures . Continue with plan of care.                                          Cleotilde Neer

## 2012-02-28 NOTE — Progress Notes (Signed)
Occupational Therapy Session Note  Patient Details  Name: Hannah Lamb MRN: [id number] Date of Birth: 1940/09/18  Today's Date: 02/28/2012 Time: 4696-2952 and 1100-1130 Time Calculation (min): 45 min and 30 min  Short Term Goals: Week 1:  OT Short Term Goal 1 (Week 1): Patient will complete transfers, bed <> w/c, using AE prn, with supervision. OT Short Term Goal 2 (Week 1): Patient will complete upper body dressing, at edge of bed, unsupported with supervision OT Short Term Goal 3 (Week 1): Patient will complete bathing, at sink side, w/c level with min assist OT Short Term Goal 4 (Week 1): Patient will complete toileting, using AE (bedpan) prn, with min assist for thoroughness OT Short Term Goal 5 (Week 1): Patient will demonstrate ability to complete R-UE P/SROM home exercise program with mod assist      Skilled Therapeutic Interventions/Progress Updates:  1:1 1.  Pt was dressed prior to OT arriving and chose to not bathe this am.  Pt seen for bathroom transfer training and UE AROM  Exercises.  Pt stated that she is most concerned about getting up to go to bathroom in middle of night. Pt worked on transferring from bed to toilet with rollater with close supervision and toileted with close supervision.  UE AROM with dowel bar.  2. Pt seen to work on tub transfers, sit to stand and standing balance.  Pt transfers to tub seat with supervision, but then needs assist to lift legs over tub wall.  Pt able to sit to stand with close supervision to walker and to a table.  At tall table, pt worked on various standing balance exercises demonstrating improving balance with weight shifting side to side and forward and back.     Therapy Documentation Precautions:  Precautions Precautions: Fall Restrictions Weight Bearing Restrictions: No General: General Amount of Missed OT Time (min): 15 Minutes - due to nursing care   Pain: Pain Assessment Pain Assessment: No/denies pain  See FIM for  current functional status  Therapy/Group: Individual Therapy  Kaarin Pardy 02/28/2012, 12:04 PM

## 2012-02-28 NOTE — Progress Notes (Signed)
02/28/12 Patients blood pressure 200/92 pulse 60 manual at 2045.  Patient no complaints of pain or distress.  Harvel Ricks, PA notified. Orders given for clonidine 0.1 mg Q 8 hrs, prn. Patient refused clonidine, keflex, muscle rub, and scd's at hs. Will continue to monitor patients blood pressure, and any signs or symptoms of distress. Patient educated on risks of hypertension and the benefits of clonidine. Patient stated she has already taken clonidine before and does not want to take it. A. Erick Oxendine,LPN.

## 2012-02-28 NOTE — Progress Notes (Signed)
Occupational Therapy Session Note  Patient Details  Name: CHERIKA JESSIE MRN: [id number] Date of Birth: 07-23-1941  Today's Date: 02/28/2012 Time: 1330-1415 Time Calculation (min): 45 min  Short Term Goals: Week 1:  OT Short Term Goal 1 (Week 1): Patient will complete transfers, bed <> w/c, using AE prn, with supervision. OT Short Term Goal 2 (Week 1): Patient will complete upper body dressing, at edge of bed, unsupported with supervision OT Short Term Goal 3 (Week 1): Patient will complete bathing, at sink side, w/c level with min assist OT Short Term Goal 4 (Week 1): Patient will complete toileting, using AE (bedpan) prn, with min assist for thoroughness OT Short Term Goal 5 (Week 1): Patient will demonstrate ability to complete R-UE P/SROM home exercise program with mod assist  Skilled Therapeutic Interventions/Progress Updates:    1:1 OT with focus on stand pivot transfers, dynamic standing balance, and education on BLE care.  Pt with noted swelling in BLE, donned TED hose to decrease swelling and encouraged pt to don them every morning and prop legs up when seated to assist with decreased swelling.  Pt min assist stand pivot transfer without Rolator, supervision with. Engaged in ball toss activity in standing with min assist to maintain forward standing posture.  Therapy Documentation Precautions:  Precautions Precautions: Fall Restrictions Weight Bearing Restrictions: No Pain:   Pt with no c/o pain this session.  See FIM for current functional status  Therapy/Group: Individual Therapy  Leonette Monarch 02/28/2012, 3:42 PM

## 2012-02-28 NOTE — Progress Notes (Signed)
Physical Therapy Session Note  Patient Details  Name: Hannah Lamb MRN: [id number] Date of Birth: [birth year]  Today's Date: 02/28/2012 Time: 0805-0900 Time Calculation (min): 55 min  Short Term Goals: Week 1:  PT Short Term Goal 1 (Week 1): Pt will perform bed <> chair transfers with min A PT Short Term Goal 1 - Progress (Week 1): Met PT Short Term Goal 2 (Week 1): Pt will gait in controlled environment with supervision 50' PT Short Term Goal 2 - Progress (Week 1): Met PT Short Term Goal 3 (Week 1): Pt will demo dynamic standing balance for functional task with min A PT Short Term Goal 3 - Progress (Week 1): Met Week 2: LTGs   Skilled Therapeutic Interventions/Progress Updates: focus on sit> stand, standing balance, gait training   Dynamic standing balance with 1 UE support, close supervision, for doffing and donning wet brief; for dynamic sitting balance to don shoes, and hand hygiene at sink.  Gait training with department Rollator x 175' including turns, and basic transfers with supervision.  Gait training on ramp and curb (1 step) with Rollator, min assist and VCs.  Pt very fatigued after tx today.     Therapy Documentation Precautions:  Precautions Precautions: Fall Restrictions Weight Bearing Restrictions: No     Pain: Pain Assessment Pain Assessment: No/denies pain         See FIM for current functional status  Therapy/Group: Individual Therapy  Tobi Groesbeck 02/28/2012, 11:23 AM

## 2012-02-29 LAB — GLUCOSE, CAPILLARY
Glucose-Capillary: 92 mg/dL (ref 70–99)
Glucose-Capillary: 41 mg/dL — CL (ref 70–99)
Glucose-Capillary: 84 mg/dL (ref 70–99)
Glucose-Capillary: 172 mg/dL — ABNORMAL HIGH (ref 70–99)
Glucose-Capillary: 76 mg/dL (ref 70–99)

## 2012-02-29 MED ORDER — DOXAZOSIN MESYLATE 1 MG PO TABS
1.0000 mg | ORAL_TABLET | Freq: Every day | ORAL | Status: DC
  Filled 2012-02-29 (×3): qty 1

## 2012-02-29 NOTE — Progress Notes (Signed)
Occupational Therapy Session Note  Patient Details  Name: Hannah Lamb MRN: [id number] Date of Birth: 07-Apr-1941  Today's Date: 02/29/2012 Time: 1040-1140 Time Calculation (min): 60 min  Short Term Goals: Week 1:  OT Short Term Goal 1 (Week 1): Patient will complete transfers, bed <> w/c, using AE prn, with supervision. OT Short Term Goal 1 - Progress (Week 1): Met OT Short Term Goal 2 (Week 1): Patient will complete upper body dressing, at edge of bed, unsupported with supervision OT Short Term Goal 2 - Progress (Week 1): Met OT Short Term Goal 3 (Week 1): Patient will complete bathing, at sink side, w/c level with min assist OT Short Term Goal 3 - Progress (Week 1): Met OT Short Term Goal 4 (Week 1): Patient will complete toileting, using AE (bedpan) prn, with min assist for thoroughness OT Short Term Goal 4 - Progress (Week 1): Met OT Short Term Goal 5 (Week 1): Patient will demonstrate ability to complete R-UE P/SROM home exercise program with mod assist OT Short Term Goal 5 - Progress (Week 1): Progressing toward goal Week 2:  OT Short Term Goal 1 (Week 2): STG = LTG due to remaining LOS (  Skilled Therapeutic Interventions/Progress Updates:    Pt seen for BADL retraining of toileting, bathing, and dressing with a focus on sit to stand and standing balance.  Pt experienced some dizziness post shower and provided with orange juice.  Nursing checked blood sugar level and it was very low. Nursing provided pt with more snacks.  Assisted pt with donning pants and shoes as pt was not feeling well.  Pt performed sit to stand with supervision to min assist.       Therapy Documentation Precautions:  Precautions Precautions: Fall Restrictions Weight Bearing Restrictions: No Pain: Pain Assessment Pain Assessment: No/denies pain  See FIM for current functional status  Therapy/Group: Individual Therapy  Brendaly Townsel 02/29/2012, 11:48 AM

## 2012-02-29 NOTE — Progress Notes (Signed)
Occupational Therapy Session Note  Patient Details  Name: Hannah Lamb MRN: [id number] Date of Birth: 09-15-1940  Today's Date: 02/29/2012 Time: 1300-1330 Time Calculation (min): 30 min  Short Term Goals: Week 1:  OT Short Term Goal 1 (Week 1): Patient will complete transfers, bed <> w/c, using AE prn, with supervision. OT Short Term Goal 1 - Progress (Week 1): Met OT Short Term Goal 2 (Week 1): Patient will complete upper body dressing, at edge of bed, unsupported with supervision OT Short Term Goal 2 - Progress (Week 1): Met OT Short Term Goal 3 (Week 1): Patient will complete bathing, at sink side, w/c level with min assist OT Short Term Goal 3 - Progress (Week 1): Met OT Short Term Goal 4 (Week 1): Patient will complete toileting, using AE (bedpan) prn, with min assist for thoroughness OT Short Term Goal 4 - Progress (Week 1): Met OT Short Term Goal 5 (Week 1): Patient will demonstrate ability to complete R-UE P/SROM home exercise program with mod assist OT Short Term Goal 5 - Progress (Week 1): Progressing toward goal  Skilled Therapeutic Interventions/Progress Updates:  Patient on toilet upon therapy arrival. 1:1 OT session with focus on toileting, toilet transfer, and functional chair transfers. Patient reports that she has had a bad day today and isn't moving as well. Patient required assist to lift off of toilet due to fatigue. Steadying assist was provided while patient performed hygiene. Patient stood at sink to wash hands with supervision. Functional transfer: Supervision with RW; to w/c. Functional transfer: Supervision with RW; to recliner. Patient demonstrated good safety awareness and judgement during all transfers. Patient did need min vc's to follow through with left foot during ambulation with RW.  Therapy Documentation Precautions:  Precautions Precautions: Fall Restrictions Weight Bearing Restrictions: No Pain: Pain Assessment Pain Assessment: No/denies  pain  See FIM for current functional status  Therapy/Group: Individual Therapy  Elmo Shumard, Charisse March 02/29/2012, 2:19 PM

## 2012-02-29 NOTE — Progress Notes (Signed)
02/29/12 Patient refused keflex, cardura, and muscle rub at HS. Patient with no sign or symptoms of distress. Patient educated on risks of untreated hypertension. A. Ibrahem Volkman,LPN.

## 2012-02-29 NOTE — Progress Notes (Signed)
Patient ID: Hannah Lamb, female   DOB: 1941/05/25, 71 y.o.   MRN: [id number]  Subjective/Complaints: Refused clonidine when systolic BP was elevated I discuseed importance of BP management in context of stroke prevention  No pains  Review of Systems  Constitutional: Negative for fever and chills.  Respiratory: Negative for cough, sputum production and shortness of breath.   Cardiovascular: Positive for chest pain.       L lower rib pain last noc  Gastrointestinal: Positive for abdominal pain.  Musculoskeletal: Positive for joint pain.       R shoulder  All other systems reviewed and are negative.   Objective: Vital Signs: Blood pressure 166/83, pulse 62, temperature 98.4 F (36.9 C), temperature source Oral, resp. rate 19, height 5\' 7"  (1.702 m), weight 95.4 kg (210 lb 5.1 oz), SpO2 97.00%. No results found. Results for orders placed during the hospital encounter of 02/20/12 (from the past 72 hour(s))  URINE CULTURE     Status: Normal   Collection Time   02/26/12  9:59 AM      Component Value Range Comment   Specimen Description URINE, CLEAN CATCH      Special Requests NONE      Culture  Setup Time [id number]      Colony Count >=100,000 COLONIES/ML      Culture KLEBSIELLA PNEUMONIAE      Report Status 02/28/2012 FINAL      Organism ID, Bacteria KLEBSIELLA PNEUMONIAE     GLUCOSE, CAPILLARY     Status: Abnormal   Collection Time   02/26/12 11:29 AM      Component Value Range Comment   Glucose-Capillary 208 (*) 70 - 99 mg/dL   GLUCOSE, CAPILLARY     Status: Abnormal   Collection Time   02/26/12  4:23 PM      Component Value Range Comment   Glucose-Capillary 260 (*) 70 - 99 mg/dL   GLUCOSE, CAPILLARY     Status: Abnormal   Collection Time   02/26/12  8:39 PM      Component Value Range Comment   Glucose-Capillary 120 (*) 70 - 99 mg/dL    Comment 1 Notify RN     GLUCOSE, CAPILLARY     Status: Normal   Collection Time   02/27/12  7:07 AM      Component Value Range Comment     Glucose-Capillary 94  70 - 99 mg/dL    Comment 1 Notify RN     GLUCOSE, CAPILLARY     Status: Abnormal   Collection Time   02/27/12 11:32 AM      Component Value Range Comment   Glucose-Capillary 60 (*) 70 - 99 mg/dL   GLUCOSE, CAPILLARY     Status: Abnormal   Collection Time   02/27/12 12:09 PM      Component Value Range Comment   Glucose-Capillary 54 (*) 70 - 99 mg/dL   GLUCOSE, CAPILLARY     Status: Abnormal   Collection Time   02/27/12 12:48 PM      Component Value Range Comment   Glucose-Capillary 49 (*) 70 - 99 mg/dL    Comment 1 Notify RN     GLUCOSE, CAPILLARY     Status: Abnormal   Collection Time   02/27/12  1:41 PM      Component Value Range Comment   Glucose-Capillary 62 (*) 70 - 99 mg/dL   GLUCOSE, CAPILLARY     Status: Abnormal   Collection Time   02/27/12  2:51  PM      Component Value Range Comment   Glucose-Capillary 109 (*) 70 - 99 mg/dL   GLUCOSE, CAPILLARY     Status: Abnormal   Collection Time   02/27/12  4:30 PM      Component Value Range Comment   Glucose-Capillary 114 (*) 70 - 99 mg/dL   GLUCOSE, CAPILLARY     Status: Abnormal   Collection Time   02/27/12  8:22 PM      Component Value Range Comment   Glucose-Capillary 167 (*) 70 - 99 mg/dL   GLUCOSE, CAPILLARY     Status: Abnormal   Collection Time   02/27/12  9:23 PM      Component Value Range Comment   Glucose-Capillary 206 (*) 70 - 99 mg/dL   GLUCOSE, CAPILLARY     Status: Abnormal   Collection Time   02/28/12  7:25 AM      Component Value Range Comment   Glucose-Capillary 145 (*) 70 - 99 mg/dL    Comment 1 Notify RN     GLUCOSE, CAPILLARY     Status: Abnormal   Collection Time   02/28/12 11:33 AM      Component Value Range Comment   Glucose-Capillary 147 (*) 70 - 99 mg/dL    Comment 1 Notify RN     GLUCOSE, CAPILLARY     Status: Abnormal   Collection Time   02/28/12  4:13 PM      Component Value Range Comment   Glucose-Capillary 121 (*) 70 - 99 mg/dL   GLUCOSE, CAPILLARY     Status:  Abnormal   Collection Time   02/28/12  7:58 PM      Component Value Range Comment   Glucose-Capillary 189 (*) 70 - 99 mg/dL   GLUCOSE, CAPILLARY     Status: Abnormal   Collection Time   02/28/12 10:22 PM      Component Value Range Comment   Glucose-Capillary 148 (*) 70 - 99 mg/dL    Comment 1 Documented in Chart      Comment 2 Notify RN     GLUCOSE, CAPILLARY     Status: Normal   Collection Time   02/29/12  7:18 AM      Component Value Range Comment   Glucose-Capillary 92  70 - 99 mg/dL    Comment 1 Notify RN       HEENT: normal Cardio: RRR Resp: CTA B/L GI: BS positive Extremity:  No Edema Skin:   Intact Neuro: Alert/Oriented, Abnormal Sensory reduced sensation to lt touch and Abnormal Motor reduced strenth on MMT Musc/Skel:  Other positive impingement R shoulder Back soreness PSIS Lower ext strength 3-/5 in L HF , Quad , TA and Gastroc 4-/5 on R side 5/5 RUE, 4/5 LUE. Overall better memory and awareness -SLR Assessment/Plan: 1. Functional deficits secondary to R basal ganglia infarct which require 3+ hours per day of interdisciplinary therapy in a comprehensive inpatient rehab setting. Physiatrist is providing close team supervision and 24 hour management of active medical problems listed below. Physiatrist and rehab team continue to assess barriers to discharge/monitor patient progress toward functional and medical goals. FIM: FIM - Bathing Bathing Steps Patient Completed: Chest;Right Arm;Left Arm;Abdomen;Front perineal area;Buttocks;Right upper leg;Left upper leg;Right lower leg (including foot);Left lower leg (including foot) Bathing: 4: Steadying assist  FIM - Upper Body Dressing/Undressing Upper body dressing/undressing steps patient completed: Thread/unthread right sleeve of pullover shirt/dresss;Thread/unthread left sleeve of pullover shirt/dress;Put head through opening of pull over shirt/dress;Pull shirt over  trunk Upper body dressing/undressing: 5: Set-up assist  to: Obtain clothing/put away FIM - Lower Body Dressing/Undressing Lower body dressing/undressing steps patient completed: Thread/unthread right underwear leg;Thread/unthread left underwear leg;Pull underwear up/down;Thread/unthread right pants leg;Thread/unthread left pants leg;Pull pants up/down;Don/Doff right sock;Don/Doff left sock Lower body dressing/undressing: 5: Set-up assist to: Obtain clothing  FIM - Toileting Toileting steps completed by patient: Adjust clothing prior to toileting;Performs perineal hygiene;Adjust clothing after toileting Toileting Assistive Devices: Grab bar or rail for support Toileting: 5: Supervision: Safety issues/verbal cues  FIM - Diplomatic Services operational officer Devices: Art gallery manager Transfers: 5-To toilet/BSC: Supervision (verbal cues/safety issues);5-From toilet/BSC: Supervision (verbal cues/safety issues)  FIM - Banker Devices:  (rollator) Bed/Chair Transfer: 5: Bed > Chair or W/C: Supervision (verbal cues/safety issues);5: Chair or W/C > Bed: Supervision (verbal cues/safety issues)  FIM - Locomotion: Wheelchair Locomotion: Wheelchair: 0: Activity did not occur FIM - Locomotion: Ambulation Locomotion: Ambulation Assistive Devices: Designer, industrial/product Ambulation/Gait Assistance: 5: Supervision Locomotion: Ambulation: 5: Travels 150 ft or more with supervision/safety issues  Comprehension Comprehension Mode: Auditory Comprehension: 5-Understands complex 90% of the time/Cues < 10% of the time  Expression Expression Mode: Verbal Expression: 5-Expresses complex 90% of the time/cues < 10% of the time  Social Interaction Social Interaction: 6-Interacts appropriately with others with medication or extra time (anti-anxiety, antidepressant).  Problem Solving Problem Solving: 5-Solves complex 90% of the time/cues < 10% of the time  Memory Memory: 5-Recognizes or recalls 90% of the time/requires cueing  < 10% of the time  Medical Problem List and Plan:  1. 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 DVT Prophylaxis/Anticoagulation: SCDs. Monitor for any signs of DVT  3. multiple sclerosis. MRI and C-spine showed no new lesions. Patient on no medication for MS prior to hospital admission  4. Diabetes mellitus. Hemoglobin A1c 10.5. Insulin aspart (novolog) 3 units 3 times a day and NovoLog 7030  25 units at breakfast, cont 17 units supper.  -drop 6/15 likely due to rx of hyperglycemia, need to raise am 70/30 5. Hypertension. bystolic 10 mg daily. Will add cardura 6. Hyperlipidemia. Lipitor  7. Hypokalemia. Followup labs  8. Thyroid mass. Plan is to followup with Dr. Suszanne Conners for biopsy as an outpatient. Patient would need to be off Plavix x7 days prior to procedure  9. Right rotator cuff disease probable tear, OT for range of motion and scapular stabilization 10.  Back pain improved with therapy and heat and analgesic balm 11. Positive UA and malodorous urine. Patient is afebrile. Cx + Kleb add keflexLOS (Days) 9 A FACE TO FACE EVALUATION WAS PERFORMED  Garold Sheeler E 02/29/2012, 8:35 AM

## 2012-02-29 NOTE — Progress Notes (Signed)
Physical Therapy Note  Patient Details  Name: Hannah Lamb MRN: [id number] Date of Birth: 04/04/1941 Today's Date: 02/29/2012  9:15-10:00 individual therapy pt denied pain.  Supine to sit supervision, stand pivot to [facility] supervision with rollator walker. Pt was able to change her own briefafter urinary incontinence. Discussed possibility of upgrading goals with OT but she states she does not feel pt is consistent enough with transfers. Gait in home environment supervision with rollator walker. Very slow speed.   [name] 02/29/2012, 11:18 AM

## 2012-02-29 NOTE — Progress Notes (Signed)
Pt refused Keflex stating "" it made my blood pressure go up yesterday", education performed, Dawn Finland PA

## 2012-02-29 NOTE — Significant Event (Signed)
CBG: 37 @11 :20am  Treatment: 15 GM carbohydrate snack followed by lunch  Symptoms: None  Follow-up CBG: Time: CBG Result:11:35-41, 11:50-67, 1200- 84  Possible Reasons for Event: Unknown  Comments/MD notified:Dan Finland PA    AYERS, Coriann Brouhard D

## 2012-02-29 NOTE — Progress Notes (Signed)
Physical Therapy Note  Patient Details  Name: Hannah Lamb MRN: [id number] Date of Birth: 28-Feb-1941 Today's Date: 02/29/2012  14:00-15:00 individual therapy pt complained of pain unrated in both knees. Pt denied intervention.  Sit to stand close supervision and min assist x 1 from rollator walker to hold walker stable. Pt was able to stand with supervision after hand placement changed with rt hand on handle and left hand lower in the crease of the handle and seat back with vc for widening BOS. Performed sit to stand to and from rollator x 3 through out session with initial vc for locking brake, hand placement on walker with turning after stand. Pt demonstrated carry over during session for brake use, foot placement and hand placement if given enough time to self correct. Gait with rollator x 90', 50', 150' with close supervision with vc for increased step length on left. gait up and down ramp min assist and retropulsion 30' increased time needed for all mobility.'   [name] 02/29/2012, 4:09 PM

## 2012-03-01 LAB — URINE CULTURE: Colony Count: >=100000

## 2012-03-01 LAB — GLUCOSE, CAPILLARY
Glucose-Capillary: 124 mg/dL — ABNORMAL HIGH (ref 70–99)
Glucose-Capillary: 241 mg/dL — ABNORMAL HIGH (ref 70–99)
Glucose-Capillary: 209 mg/dL — ABNORMAL HIGH (ref 70–99)

## 2012-03-01 MED ORDER — TERAZOSIN HCL 1 MG PO CAPS
1.0000 mg | ORAL_CAPSULE | Freq: Every day | ORAL | Status: DC
  Filled 2012-03-01 (×5): qty 1

## 2012-03-01 MED ORDER — CLONIDINE HCL 0.1 MG PO TABS
0.1000 mg | ORAL_TABLET | Freq: Three times a day (TID) | ORAL | Status: DC | PRN
  Filled 2012-03-01 (×3): qty 1

## 2012-03-01 MED ORDER — INSULIN ASPART PROT & ASPART (70-30 MIX) 100 UNIT/ML ~~LOC~~ SUSP
15.0000 [IU] | Freq: Every day | SUBCUTANEOUS | Status: DC
  Administered 2012-03-01 – 2012-03-04 (×4): 15 [IU] via SUBCUTANEOUS

## 2012-03-01 MED ORDER — INSULIN ASPART PROT & ASPART (70-30 MIX) 100 UNIT/ML ~~LOC~~ SUSP
23.0000 [IU] | Freq: Every day | SUBCUTANEOUS | Status: DC
  Administered 2012-03-03 – 2012-03-05 (×3): 23 [IU] via SUBCUTANEOUS

## 2012-03-01 NOTE — Progress Notes (Addendum)
Patient ID: Hannah Lamb, female   DOB: [birth year], 71 y.o.   MRN: [id number] Pt's nurse reporting that pt and daughter refusing meds and wanting to leave hospital AMA. Met with pt and daughter to discuss issues. Pt reports that she is refusing meds  because they cause BP and CBGs to be out of control. Daughter reports that pt's BP and CBGs are worse in hospital than at home. This RN explained that pt has a UTI and the effects it can have on CBGs. Provided a printed copy of her urinalysis to show the amount of bacteria in her urine on 02/24/12.  Explained the complications of untreated  UTI and HTN and explained risks of not taking meds for UTI and BP.  Pt and daughter report understanding of high risk of stroke and sepsis if pt does not take meds & discharges AMA. Explained that we would like to get her BP and CBGs controlled before she goes home to prevent complications and return to hospital. Explained pt rights and meaning and process of AMA. Pt and dtr agreed for her to take Keflex and requested that we monitor her BP and CBG after taking it. Pt also agreed to stay in hospital until planned d/c date of 03/05/12. Explained to pt that her BP med was changed today and encouraged pt to take it this PM.  Reported above to pt's RN. Also reported encounter to Dan Finland, PA.

## 2012-03-01 NOTE — Progress Notes (Signed)
Occupational Therapy Session Note  Patient Details  Name: Hannah Lamb MRN: [id number] Date of Birth: March 04, 1941  Today's Date: 03/01/2012 Time: 1030-1050 Time Calculation (min): 20 min  Short Term Goals: Week 1:  OT Short Term Goal 1 (Week 1): Patient will complete transfers, bed <> w/c, using AE prn, with supervision. OT Short Term Goal 1 - Progress (Week 1): Met OT Short Term Goal 2 (Week 1): Patient will complete upper body dressing, at edge of bed, unsupported with supervision OT Short Term Goal 2 - Progress (Week 1): Met OT Short Term Goal 3 (Week 1): Patient will complete bathing, at sink side, w/c level with min assist OT Short Term Goal 3 - Progress (Week 1): Met OT Short Term Goal 4 (Week 1): Patient will complete toileting, using AE (bedpan) prn, with min assist for thoroughness OT Short Term Goal 4 - Progress (Week 1): Met OT Short Term Goal 5 (Week 1): Patient will demonstrate ability to complete R-UE P/SROM home exercise program with mod assist OT Short Term Goal 5 - Progress (Week 1): Progressing toward goal Week 2:  OT Short Term Goal 1 (Week 2): STG = LTG due to remaining LOS (  Skilled Therapeutic Interventions/Progress Updates: Pt scheduled for B/D, but pt declined to participate.   She stated that she was extremely tired as she just woke up from a nap.  Nursing checked blood sugar level and it was 247.  [name] declined to have her mother participate with therapy due to high sugar level.  I recommended a short walk, but daughter declined.  Pt stated she wanted to rest.  Discussed pt's current level of supervision with all self care and that she sometimes needs assist with coming into stand if very fatigued. Offered to demonstrate with pt tub bench transfers, but daughter declined stating that she was comfortable helping her mother with that.  Discussed safety with tub bench and other concerns relating to therapy.  Daughter stated that she does not have any concerns  about discharge to home.     Therapy Documentation Precautions:  Precautions Precautions: Fall Restrictions Weight Bearing Restrictions: No General: General Amount of Missed OT Time (min): 40 Minutes    Pain: Pain Assessment Pain Assessment: No/denies pain  See FIM for current functional status  Therapy/Group: Individual Therapy  Kasha Howeth 03/01/2012, 11:36 AM

## 2012-03-01 NOTE — Progress Notes (Signed)
Manual BP taken at 1824 by RN: 237/98. Patient denied any adverse symptoms. After extensive education to patient and family members in the room, patient willing to take prn 0.1 mg clonidine po. However, patient refused scheduled dose of Plavix. Will recheck BP and continue to monitor patient. Patient continues to refuse medications based on reported side effects not normally reported in patients who take these medications. Patient educated that with blood pressures continuing to stay in these high ranges, she has a very high risk of developing another stroke. Patient states, "Well let me have another stroke."  Hedy Camara

## 2012-03-01 NOTE — Progress Notes (Signed)
Patient's CBG 241 at 1045. 2 units of Novolog given at 1100 per sliding scale. Patient's daughter refused therapies, saying blood sugar is too high. Patient nonsymptomatic. Patient's daughter requesting discharge. Educated on consequences of leaving AMA. Asked if patient would like to speak with MD or PA about discharge and other options, leaving AMA, etc. Patient's daughter said, "give [place] some time to talk about it and I'll let you know." Harvel Ricks, PA notified. No new updates at this time. Hedy Camara

## 2012-03-01 NOTE — Patient Care Conference (Signed)
Inpatient RehabilitationTeam Conference Note Date: 02/29/2012   Time: 10:55 AM    Patient Name: Hannah Lamb      Medical Record Number: [id number]  Date of Birth: 1941-09-01 Sex: Female         Room/Bed: 4034/4034-01 Payor Info: Payor: MEDICARE  Plan: MEDICARE PART A AND B  Product Type: *No Product type*     Admitting Diagnosis: R CVA -MS  Admit Date/Time:  02/20/2012  4:51 PM Admission Comments: No comment available   Primary Diagnosis:  CVA (cerebral infarction) Principal Problem: CVA (cerebral infarction)  Patient Active Problem List   Diagnosis Date Noted  . CVA (cerebral infarction) 02/21/2012  . Abnormal EKG 02/16/2012  . Weakness generalized 02/15/2012  . Hypertensive emergency 02/15/2012  . Multiple sclerosis 02/15/2012  . DIABETES MELLITUS II, UNCOMPLICATED 11/09/2006  . HYPERCHOLESTEROLEMIA 11/09/2006  . HYPERTENSION, BENIGN SYSTEMIC 11/09/2006  . ARTHRITIS 11/09/2006    Expected Discharge Date: Expected Discharge Date: 03/05/12  Team Members Present: Physician: Dr. Claudette Laws Case Manager Present: Lutricia Horsfall, RN Social Worker Present: Dossie Der, LCSW Nurse Present: Rosalio Macadamia, RN PT Present: Vincente Liberty, PTA;Edman Circle, PT OT Present: Bretta Bang, Verlene Mayer, OT SLP Present: Fae Pippin, SLP PPS Coordinator: Tora Duck, RN    Current Status/Progress Goal Weekly Team Focus  Medical   intermittent compliance with medication management, elevated systolic blood pressure  reduce systolic blood pressure to less than 140  adjust blood pressure medication   Bowel/Bladder   continent of bowel and bladder with timed toileting /incontinent at times without time toileting  min assist      Swallow/Nutrition/ Hydration             ADL's   supervision with toileting, dressing; steady assist with bathing; mod assist with tub bench to tub  supervision with bathing, mod I with dressing UB, supervision with LB dressing and toileting  ADL  retraining, standing balance, RUE AROM, pt / family education   Mobility   occasional min asssit for sit to stand and steps otherwise supervision.  supervision overall  family education and sit to stand   Communication             Safety/Cognition/ Behavioral Observations  no unsafe behavior         Pain   pain to right shoulder  < = 2      Skin   no skin breakdown            *See Interdisciplinary Assessment and Plan and progress notes for long and short-term goals  Barriers to Discharge: control blood pressure prior to discharge    Possible Resolutions to Barriers:  see above, educate patient to stroke prevention techniques    Discharge Planning/Teaching Needs:  Home with daughter's who can provide care and have gone through [employer] while here.      Team Discussion:  Pt's BP uncontrolled, adjusting meds. Pt refusing meds at times. Pt incontinent of urine, working on timed toileting, refuses to get up to bathroom during the night. Pt was incont of urine at night PTA. Downgrading some goals. Have requested family to bring pt's glucometer to assess DM education  needs.To have home health.  Revisions to Treatment Plan:  OT downgrading goals to supervision.   Continued Need for Acute Rehabilitation Level of Care: The patient requires daily medical management by a physician with specialized training in physical medicine and rehabilitation for the following conditions: Daily direction of a multidisciplinary physical rehabilitation program to ensure safe  treatment while eliciting the highest outcome that is of practical value to the patient.: Yes Daily medical management of patient stability for increased activity during participation in an intensive rehabilitation regime.: Yes Daily analysis of laboratory values and/or radiology reports with any subsequent need for medication adjustment of medical intervention for : Neurological problems;Other  Meryl Dare 03/01/2012, 10:52 AM

## 2012-03-01 NOTE — Progress Notes (Signed)
Patient refused to take 0800 dose of Cardura. Patient and daughter educated on the drug and the effects of hypertension. Educated on current blood pressures and possibility of subsequent stroke with untreated hypertension. Patient states, "I know." Stated that she has been on Cardura before and it "made me feel like an invalid." Reported it caused bloody and black stools as well as constipation. Patient insisted that these side effects were from the Cardura. Dr. Wynn Banker notified. No new orders at this time. Continuing to educate patient on hypertension and its effects. Hedy Camara

## 2012-03-01 NOTE — Progress Notes (Signed)
Physical Therapy Session Note  Patient Details  Name: Hannah Lamb MRN: [id number] Date of Birth: 21-Aug-1941  Today's Date: 03/01/2012 Time:0805-0900 and  [id number] Time Calculation (min): 55 min and 27 min  Short Term Goals: Week 2: LTGS    Skilled Therapeutic Interventions/Progress Updates:   AM:  Sit> stand from elevated bed and standing endurance and balance for clothing management, hygiene due to wet brief, with close supervision.  Family ed with daughter Harvie Bridge. Gait training with personal Rollator walker x 175' with supervision, VCs for forward gaze, upright posture.  Simulated car transfer to low sedan height with supervision using Rollator to enter; mod assist to exit due to difficulty with sit> stand from low seat.  Daughter requested a thick cushion for her mother to sit on in w/c to enable better function.  Will discuss with MSW.  PM:  Pt initially refused tx, due to not feeling well from new HTN med.  Pt agreed to do therapeutic exercise performed with LE to increase strength for functional mobility, in sitting.  Neuromuscular re-education for trunk shortening/lengthening with wt shifting.  Pt was unable to elevate hip on either side,and showed minimal head and trunk righting reactions.    Introduced [employer] with abdominal activation.    1 x 10: heel slides bil; alternating knee extension; core activation       Therapy Documentation Precautions:  Precautions Precautions: Fall Restrictions Weight Bearing Restrictions: No Pain: Pain Assessment Pain Assessment: No/denies pain Locomotion :        See FIM for current functional status  Therapy/Group: Individual Therapy  Candy Ziegler 03/01/2012, 1:38 PM

## 2012-03-01 NOTE — Progress Notes (Signed)
Inpatient Diabetes Program Recommendations  AACE/ADA: New Consensus Statement on Inpatient Glycemic Control (2009)  Target Ranges:  Prepandial:   less than 140 mg/dL      Peak postprandial:   less than 180 mg/dL (1-2 hours)      Critically ill patients:  140 - 180 mg/dL   Results for RHESA, FORSBERG (MRN [id number]) as of 03/01/2012 12:00  Ref. Range 02/29/2012 11:21 02/29/2012 11:30 02/29/2012 11:45 02/29/2012 12:01 02/29/2012 16:38 02/29/2012 18:51 02/29/2012 20:55 03/01/2012 02:16 03/01/2012 03:00 03/01/2012 07:19 03/01/2012 10:43  Glucose-Capillary Latest Range: 70-99 mg/dL 37 (LL) 41 (LL) 68 (L) 84 172 (H) 142 (H) 76 63 (L) 131 (H) 124 (H) 241 (H)    Inpatient Diabetes Program Recommendations Insulin - Basal: Decrease breakfast dose 70/30 to 15 units; agree with supper dose 15  Note: 70/30 currently reduced from 25 units to 23 units.  A greater reduction will likely be needed to prevent hypoglycemia 30-40s.   Thank you  Piedad Climes [facility] Inpatient Diabetes Coordinator [phone]

## 2012-03-01 NOTE — Progress Notes (Signed)
CBG:63   0220  Treatment: 15 GM carbohydrate snack  Symptoms: None  Follow-up 0300 CBG Result:131  Possible Reasons for Event: Unknown  Comments/MD notified:Dan Angiulli,PA    Dwyane Luo D

## 2012-03-01 NOTE — Progress Notes (Signed)
Physical Therapy Note  Patient Details  Name: Hannah Lamb MRN: [id number] Date of Birth: September 18, 1940 Today's Date: 03/01/2012  Pt declined 45 minutes therapy stating she was afraid of side effects from keflex. When questioned further, pt stated she was afraid she would be dizzy and just not feel right. PT offerred therex or other non standing activity but pt still declined.   Michaelene Song 03/01/2012, 2:16 PM

## 2012-03-01 NOTE — Plan of Care (Signed)
Problem: RH KNOWLEDGE DEFICIT Goal: RH STG INCREASE KNOWLEDGE OF DIABETES Outcome: Progressing Multiple attempts to educate patient and family on diabetes throughout stay Goal: RH STG INCREASE KNOWLEDGE OF HYPERTENSION Outcome: Not Progressing Multiple attempts to educate family and patient on hypertension throughout patient's stay; patient and family refusing medications

## 2012-03-01 NOTE — Progress Notes (Signed)
Patient ID: Hannah Lamb, female   DOB: 1941/03/04, 71 y.o.   MRN: [id number]  Subjective/Complaints: Refused clonidine when systolic BP was elevated on 6/18, refused cardura last noc. Refused a dose of keflex. Pt has been educated by 2 MDs, 2 RNs and PA regarding importance of meds  No pains  Review of Systems  Constitutional: Negative for fever and chills.  Respiratory: Negative for cough, sputum production and shortness of breath.   Cardiovascular: Positive for chest pain.       L lower rib pain last noc  Gastrointestinal: Positive for abdominal pain.  Musculoskeletal: Positive for joint pain.       R shoulder  All other systems reviewed and are negative.   Objective: Vital Signs: Blood pressure 163/71, pulse 62, temperature 98.3 F (36.8 C), temperature source Oral, resp. rate 19, height 5\' 7"  (1.702 m), weight 86.3 kg (190 lb 4.1 oz), SpO2 95.00%. No results found. Results for orders placed during the hospital encounter of 02/20/12 (from the past 72 hour(s))  GLUCOSE, CAPILLARY     Status: Abnormal   Collection Time   02/27/12 11:32 AM      Component Value Range Comment   Glucose-Capillary 60 (*) 70 - 99 mg/dL   GLUCOSE, CAPILLARY     Status: Abnormal   Collection Time   02/27/12 12:09 PM      Component Value Range Comment   Glucose-Capillary 54 (*) 70 - 99 mg/dL   GLUCOSE, CAPILLARY     Status: Abnormal   Collection Time   02/27/12 12:48 PM      Component Value Range Comment   Glucose-Capillary 49 (*) 70 - 99 mg/dL    Comment 1 Notify RN     GLUCOSE, CAPILLARY     Status: Abnormal   Collection Time   02/27/12  1:41 PM      Component Value Range Comment   Glucose-Capillary 62 (*) 70 - 99 mg/dL   GLUCOSE, CAPILLARY     Status: Abnormal   Collection Time   02/27/12  2:51 PM      Component Value Range Comment   Glucose-Capillary 109 (*) 70 - 99 mg/dL   GLUCOSE, CAPILLARY     Status: Abnormal   Collection Time   02/27/12  4:30 PM      Component Value Range Comment   Glucose-Capillary 114 (*) 70 - 99 mg/dL   GLUCOSE, CAPILLARY     Status: Abnormal   Collection Time   02/27/12  8:22 PM      Component Value Range Comment   Glucose-Capillary 167 (*) 70 - 99 mg/dL   GLUCOSE, CAPILLARY     Status: Abnormal   Collection Time   02/27/12  9:23 PM      Component Value Range Comment   Glucose-Capillary 206 (*) 70 - 99 mg/dL   GLUCOSE, CAPILLARY     Status: Abnormal   Collection Time   02/28/12  7:25 AM      Component Value Range Comment   Glucose-Capillary 145 (*) 70 - 99 mg/dL    Comment 1 Notify RN     GLUCOSE, CAPILLARY     Status: Abnormal   Collection Time   02/28/12 11:33 AM      Component Value Range Comment   Glucose-Capillary 147 (*) 70 - 99 mg/dL    Comment 1 Notify RN     URINE CULTURE     Status: Normal   Collection Time   02/28/12 12:58 PM  Component Value Range Comment   Specimen Description URINE, CLEAN CATCH      Special Requests NONE      Culture  Setup Time [id number]      Colony Count >=100,000 COLONIES/ML      Culture KLEBSIELLA PNEUMONIAE      Report Status 03/01/2012 FINAL      Organism ID, Bacteria KLEBSIELLA PNEUMONIAE     GLUCOSE, CAPILLARY     Status: Abnormal   Collection Time   02/28/12  4:13 PM      Component Value Range Comment   Glucose-Capillary 121 (*) 70 - 99 mg/dL   GLUCOSE, CAPILLARY     Status: Abnormal   Collection Time   02/28/12  7:58 PM      Component Value Range Comment   Glucose-Capillary 189 (*) 70 - 99 mg/dL   GLUCOSE, CAPILLARY     Status: Abnormal   Collection Time   02/28/12 10:22 PM      Component Value Range Comment   Glucose-Capillary 148 (*) 70 - 99 mg/dL    Comment 1 Documented in Chart      Comment 2 Notify RN     GLUCOSE, CAPILLARY     Status: Normal   Collection Time   02/29/12  7:18 AM      Component Value Range Comment   Glucose-Capillary 92  70 - 99 mg/dL    Comment 1 Notify RN     GLUCOSE, CAPILLARY     Status: Abnormal   Collection Time   02/29/12 11:21 AM      Component  Value Range Comment   Glucose-Capillary 37 (*) 70 - 99 mg/dL    Comment 1 Notify RN     GLUCOSE, CAPILLARY     Status: Abnormal   Collection Time   02/29/12 11:30 AM      Component Value Range Comment   Glucose-Capillary 41 (*) 70 - 99 mg/dL   GLUCOSE, CAPILLARY     Status: Abnormal   Collection Time   02/29/12 11:45 AM      Component Value Range Comment   Glucose-Capillary 68 (*) 70 - 99 mg/dL    Comment 1 Notify RN     GLUCOSE, CAPILLARY     Status: Normal   Collection Time   02/29/12 12:01 PM      Component Value Range Comment   Glucose-Capillary 84  70 - 99 mg/dL    Comment 1 Notify RN     GLUCOSE, CAPILLARY     Status: Abnormal   Collection Time   02/29/12  4:38 PM      Component Value Range Comment   Glucose-Capillary 172 (*) 70 - 99 mg/dL   GLUCOSE, CAPILLARY     Status: Abnormal   Collection Time   02/29/12  6:51 PM      Component Value Range Comment   Glucose-Capillary 142 (*) 70 - 99 mg/dL   GLUCOSE, CAPILLARY     Status: Normal   Collection Time   02/29/12  8:55 PM      Component Value Range Comment   Glucose-Capillary 76  70 - 99 mg/dL   GLUCOSE, CAPILLARY     Status: Abnormal   Collection Time   03/01/12  2:16 AM      Component Value Range Comment   Glucose-Capillary 63 (*) 70 - 99 mg/dL    Comment 1 Notify RN     GLUCOSE, CAPILLARY     Status: Abnormal   Collection Time   03/01/12  3:00 AM      Component Value Range Comment   Glucose-Capillary 131 (*) 70 - 99 mg/dL    Comment 1 Notify RN     GLUCOSE, CAPILLARY     Status: Abnormal   Collection Time   03/01/12  7:19 AM      Component Value Range Comment   Glucose-Capillary 124 (*) 70 - 99 mg/dL     HEENT: normal Cardio: RRR Resp: CTA B/L GI: BS positive Extremity:  No Edema Skin:   Intact Neuro: Alert/Oriented, Abnormal Sensory reduced sensation to lt touch and Abnormal Motor reduced strenth on MMT Musc/Skel:  Other positive impingement R shoulder Back soreness PSIS Lower ext strength 3-/5 in L HF ,  Quad , TA and Gastroc 4-/5 on R side 5/5 RUE, 4/5 LUE. Overall better memory and awareness -SLR Assessment/Plan: 1. Functional deficits secondary to R basal ganglia infarct which require 3+ hours per day of interdisciplinary therapy in a comprehensive inpatient rehab setting. Physiatrist is providing close team supervision and 24 hour management of active medical problems listed below. Physiatrist and rehab team continue to assess barriers to discharge/monitor patient progress toward functional and medical goals. FIM: FIM - Bathing Bathing Steps Patient Completed: Chest;Right Arm;Left Arm;Abdomen;Front perineal area;Buttocks;Right upper leg;Left upper leg;Right lower leg (including foot);Left lower leg (including foot) Bathing: 5: Supervision: Safety issues/verbal cues  FIM - Upper Body Dressing/Undressing Upper body dressing/undressing steps patient completed: Thread/unthread right sleeve of pullover shirt/dresss;Thread/unthread left sleeve of pullover shirt/dress;Put head through opening of pull over shirt/dress;Pull shirt over trunk Upper body dressing/undressing: 5: Set-up assist to: Obtain clothing/put away FIM - Lower Body Dressing/Undressing Lower body dressing/undressing steps patient completed: Thread/unthread right underwear leg;Thread/unthread left underwear leg;Pull underwear up/down;Thread/unthread right pants leg;Thread/unthread left pants leg;Pull pants up/down;Don/Doff right sock;Don/Doff left sock Lower body dressing/undressing: 5: Set-up assist to: Obtain clothing  FIM - Toileting Toileting steps completed by patient: Adjust clothing prior to toileting;Performs perineal hygiene;Adjust clothing after toileting Toileting Assistive Devices: Grab bar or rail for support Toileting: 4: Steadying assist  FIM - Diplomatic Services operational officer Devices: Art gallery manager Transfers: 3-From toilet/BSC: Mod A (lift or lower assist)  FIM - Event organiser Devices:  (rollator) Bed/Chair Transfer: 5: Supine > Sit: Supervision (verbal cues/safety issues);5: Bed > Chair or W/C: Supervision (verbal cues/safety issues);5: Chair or W/C > Bed: Supervision (verbal cues/safety issues)  FIM - Locomotion: Wheelchair Locomotion: Wheelchair: 0: Activity did not occur FIM - Locomotion: Ambulation Locomotion: Health visitor Devices:  (rollator) Ambulation/Gait Assistance: 5: Supervision Locomotion: Ambulation: 5: Travels 150 ft or more with supervision/safety issues  Comprehension Comprehension Mode: Auditory Comprehension: 5-Understands basic 90% of the time/requires cueing < 10% of the time  Expression Expression Mode: Verbal Expression: 5-Expresses complex 90% of the time/cues < 10% of the time  Social Interaction Social Interaction: 6-Interacts appropriately with others with medication or extra time (anti-anxiety, antidepressant).  Problem Solving Problem Solving: 5-Solves basic problems: With no assist  Memory Memory: 5-Recognizes or recalls 90% of the time/requires cueing < 10% of the time  Medical Problem List and Plan:  1. 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 DVT Prophylaxis/Anticoagulation: SCDs. Monitor for any signs of DVT  3. multiple sclerosis. MRI and C-spine showed no new lesions. Patient on no medication for MS prior to hospital admission  4. Diabetes mellitus uncontrolled. Hemoglobin A1c 10.5. Insulin aspart (novolog) 3 units 3 times a day  and NovoLog 7030 reduce 23 units at breakfast, cont 15 units supper.   5. Hypertension. bystolic 10 mg daily. Will add cardura 6. Hyperlipidemia. Lipitor  7. Hypokalemia. Followup labs  8. Thyroid mass. Plan is to followup with Dr. Suszanne Conners for biopsy as an outpatient. Patient would need to be off Plavix x7 days prior to procedure  9. Right rotator cuff disease probable  tear, OT for range of motion and scapular stabilization 10.  Back pain improved with therapy and heat and analgesic balm 11. Positive UA and malodorous urine. Patient is afebrile. Cx + Kleb add keflexLOS (Days) 10 A FACE TO FACE EVALUATION WAS PERFORMED  Sharin Altidor E 03/01/2012, 8:30 AM

## 2012-03-01 NOTE — Progress Notes (Signed)
Social Work Patient ID: Hannah Lamb, female   DOB: 01/31/1941, 71 y.o.   MRN: [id number] Met with pt and daughter to discuss discharge plans.  Both very pleased with pt's progress here, she is doing things she was not doing at home prior to admission. Agreeable to Home health follow up therapies.  Daughter to pursue a scooter on her own does not want a manual or transport wheelchair.  Pt has a Tourist information centre manager at home, but Daughter feels it is too big and doesn't maneuver well.  She wants something smaller.  Family will provide 24 hour supervision level and have gone through family Education.  Pt reports she has lost 10lbs here in the hospital and feels good.

## 2012-03-02 LAB — GLUCOSE, CAPILLARY
Glucose-Capillary: 80 mg/dL (ref 70–99)
Glucose-Capillary: 82 mg/dL (ref 70–99)
Glucose-Capillary: 216 mg/dL — ABNORMAL HIGH (ref 70–99)
Glucose-Capillary: 186 mg/dL — ABNORMAL HIGH (ref 70–99)

## 2012-03-02 NOTE — Progress Notes (Signed)
Physical Therapy Session Note  Patient Details  Name: Hannah Lamb MRN: [id number] Date of Birth: Jan 12, 1941  Today's Date: 03/02/2012 Time: 0803-0903 Time Calculation (min): 60 min  Short Term Goals:  Week 2:LTGs     Skilled Therapeutic Interventions/Progress Updates: focus on family ed with daughter Harden Mo and granddaughter Kyung Rudd  Dynamic sitting balance- donning shoes from w/c, with supervision.    Sit> stand- components with  RW in front with brakes locked, VCs PRN of scoot forward, nose over toes, push up.  Simulated car transfer using RW, to sedan height, with supervision to enter, min assist to exit due to difficulty pushing up with R UE, due to R rotator cuff problem.  Bed mobility- modified independent on regular bed, using normal movement patterns.  Gait training with RW with supervision, x 150', with cues to look forward, stand tall, widen stance.  Also up/down ramp and up/down curb using RW.  Pt needs assistance to place RW up/down curb, but otherwise supervision.  HEP of bil bridging in supine, focusing on isometric contractions on end range, and bil heel slides in sitting each 10 x 1 BID.    Standing endurance at sink with bil UE support x 5 minutes, while granddaughter styled pt's hair.       Therapy Documentation Precautions:  Precautions Precautions: Fall Restrictions Weight Bearing Restrictions: No   Pain: Pain Assessment Pain Assessment: No/denies pain   Locomotion : Ambulation Ambulation/Gait Assistance: 5: Supervision         See FIM for current functional status  Therapy/Group: Individual Therapy  [name] 03/02/2012, 9:35 AM

## 2012-03-02 NOTE — Progress Notes (Signed)
Occupational Therapy Session Note  Patient Details  Name: Hannah Lamb MRN: [id number] Date of Birth: September 05, 1941  Today's Date: 03/02/2012 Time: 0930-1030 and 1305-1350 Time Calculation (min): 60 min and 45 min  Short Term Goals: Week 2:  OT Short Term Goal 1 (Week 2): STG = LTG due to remaining LOS (  Skilled Therapeutic Interventions/Progress Updates:    1) Pt had completed bathing and dressing prior to session secondary to incontinent episode during night.  Focus on toilet transfers, tub/shower transfers, functional mobility, grooming in standing, and hands on family education.  Pt reported need to use toilet, close supervision ambulation and transfer with use of rollator walker.  Pt required min cues initially for hand placement and body positioning prior to sit <> stand.  Simulated tub bench transfer into tub/shower with daughter present (not the daughter she lives with), daughter able to provide necessary cues to increase safety and independence with sit to stand.  Simulated toilet transfer with daughter and granddaughter with each providing cues if needed.    2) 1:1 OT with focus on toilet transfer, functional mobility with RW, and HEP for Rt shoulder/rotator cuff.  Pt supervision/cues with sit to stand w/c > rollator > elevated toilet seat > rollator.  Upon initial standing pt required cues to scoot to edge of w/c and "nose over toes" when going from sit to stand to increase weight shifting.  Pt supervision with toileting (hygiene and clothing management).  Pt reports fatigue but willing to participate.  HEP with handouts provided for self-ROM with shoulder flexion and PROM horizontal abduction/adduction.  Educated daughter, Mamie, on HEP and encouraged pt to do these 2-3x/day along with shoulder shrugs to increase ROM and decrease tightness.  Therapy Documentation Precautions:  Precautions Precautions: Fall Restrictions Weight Bearing Restrictions: No Pain: Pain Assessment Pain  Assessment: No/denies pain  See FIM for current functional status  Therapy/Group: Individual Therapy  Leonette Monarch 03/02/2012, 12:24 PM

## 2012-03-02 NOTE — Care Management Note (Addendum)
Noted that pt did not take Keflex and Hytrin this AM. Talked with pt at 11:30 AM to review what we discussed yesterday and reminded her that she agreed to take Keflex for her UTI. Pt agreed to take it while in CIR and at home as prescribed. 2 family members present and expressed understanding of education re UTI and importance of Keflex. Also reviewed possible consequences of not taking BP meds.

## 2012-03-02 NOTE — Progress Notes (Signed)
Physical Therapy Note  Patient Details  Name: PRITI CONSOLI MRN: [id number] Date of Birth: [birth year] Today's Date: 03/02/2012  10:00-10:30 individual therapy pt denied pain.  Performed BSC transfer with pt's granddaughter, Kyung Rudd, vc needed for technique for sit to stand. Pt performed with supervision.    [name] 03/02/2012, 12:45 PM

## 2012-03-02 NOTE — Progress Notes (Signed)
Per State Regulation 482.30 This chart was reviewed for medical necessity with respect to the patient's Admission/Duration of stay.  Pt continues with BP and CBGs uncontrolled. Pt refusing some meds. Pt and family education re: meds, stroke prevention, DM is ongoing daily. Meryl Dare                 Nurse Care Manager            Next Review Date: 03/05/12

## 2012-03-02 NOTE — Progress Notes (Signed)
Patient b/p at 2110 was 184/90. Pt refused Hyterin. B/p taken at 0200 was 122/80. Blood sugar was 80. CNA and nurse went to patient's room during the night to change pt brief. Pt refused brief change several times pt daughter at bedside stated to "do it later". Pt and family was educated of the risk of lying in urine. They said they understood. Patient wanted  to take shower this a.m.

## 2012-03-02 NOTE — Progress Notes (Signed)
CBG: *64  Treatment: Ate banana  Symptoms: None  Follow-up CBG: Time:0737 CBG Result:*84  Possible Reasons for Event: Not eating snack  Comments/MD notified:  Dr. Wynn Banker at bedside    Ellin Mayhew, [facility]iva Signs

## 2012-03-02 NOTE — Progress Notes (Signed)
Patient ID: Hannah Lamb, female   DOB: [birth year], 71 y.o.   MRN: [id number]  Subjective/Complaints: Pt apologized for being ornery, non compliant yesterday.  Stated she will take her meds and do therepay today.  Discussed importance of BP management for CVA prevention  No pains  Review of Systems  Constitutional: Negative for fever and chills.  Respiratory: Negative for cough, sputum production and shortness of breath.   Cardiovascular: Positive for chest pain.       L lower rib pain last noc  Gastrointestinal: Positive for abdominal pain.  Musculoskeletal: Positive for joint pain.       R shoulder  All other systems reviewed and are negative.   Objective: Vital Signs: Blood pressure 166/85, pulse 62, temperature 98.5 F (36.9 C), temperature source Oral, resp. rate 17, height 5\' 7"  (1.702 m), weight 86.3 kg (190 lb 4.1 oz), SpO2 98.00%. No results found. Results for orders placed during the hospital encounter of 02/20/12 (from the past 72 hour(s))  GLUCOSE, CAPILLARY     Status: Abnormal   Collection Time   02/28/12  7:25 AM      Component Value Range Comment   Glucose-Capillary 145 (*) 70 - 99 mg/dL    Comment 1 Notify RN     GLUCOSE, CAPILLARY     Status: Abnormal   Collection Time   02/28/12 11:33 AM      Component Value Range Comment   Glucose-Capillary 147 (*) 70 - 99 mg/dL    Comment 1 Notify RN     URINE CULTURE     Status: Normal   Collection Time   02/28/12 12:58 PM      Component Value Range Comment   Specimen Description URINE, CLEAN CATCH      Special Requests NONE      Culture  Setup Time [id number]      Colony Count >=100,000 COLONIES/ML      Culture KLEBSIELLA PNEUMONIAE      Report Status 03/01/2012 FINAL      Organism ID, Bacteria KLEBSIELLA PNEUMONIAE     GLUCOSE, CAPILLARY     Status: Abnormal   Collection Time   02/28/12  4:13 PM      Component Value Range Comment   Glucose-Capillary 121 (*) 70 - 99 mg/dL   GLUCOSE, CAPILLARY     Status: Abnormal    Collection Time   02/28/12  7:58 PM      Component Value Range Comment   Glucose-Capillary 189 (*) 70 - 99 mg/dL   GLUCOSE, CAPILLARY     Status: Abnormal   Collection Time   02/28/12 10:22 PM      Component Value Range Comment   Glucose-Capillary 148 (*) 70 - 99 mg/dL    Comment 1 Documented in Chart      Comment 2 Notify RN     GLUCOSE, CAPILLARY     Status: Normal   Collection Time   02/29/12  7:18 AM      Component Value Range Comment   Glucose-Capillary 92  70 - 99 mg/dL    Comment 1 Notify RN     GLUCOSE, CAPILLARY     Status: Abnormal   Collection Time   02/29/12 11:21 AM      Component Value Range Comment   Glucose-Capillary 37 (*) 70 - 99 mg/dL    Comment 1 Notify RN     GLUCOSE, CAPILLARY     Status: Abnormal   Collection Time   02/29/12 11:30 AM  Component Value Range Comment   Glucose-Capillary 41 (*) 70 - 99 mg/dL   GLUCOSE, CAPILLARY     Status: Abnormal   Collection Time   02/29/12 11:45 AM      Component Value Range Comment   Glucose-Capillary 68 (*) 70 - 99 mg/dL    Comment 1 Notify RN     GLUCOSE, CAPILLARY     Status: Normal   Collection Time   02/29/12 12:01 PM      Component Value Range Comment   Glucose-Capillary 84  70 - 99 mg/dL    Comment 1 Notify RN     GLUCOSE, CAPILLARY     Status: Abnormal   Collection Time   02/29/12  4:38 PM      Component Value Range Comment   Glucose-Capillary 172 (*) 70 - 99 mg/dL   GLUCOSE, CAPILLARY     Status: Abnormal   Collection Time   02/29/12  6:51 PM      Component Value Range Comment   Glucose-Capillary 142 (*) 70 - 99 mg/dL   GLUCOSE, CAPILLARY     Status: Normal   Collection Time   02/29/12  8:55 PM      Component Value Range Comment   Glucose-Capillary 76  70 - 99 mg/dL   GLUCOSE, CAPILLARY     Status: Abnormal   Collection Time   03/01/12  2:16 AM      Component Value Range Comment   Glucose-Capillary 63 (*) 70 - 99 mg/dL    Comment 1 Notify RN     GLUCOSE, CAPILLARY     Status: Abnormal    Collection Time   03/01/12  3:00 AM      Component Value Range Comment   Glucose-Capillary 131 (*) 70 - 99 mg/dL    Comment 1 Notify RN     GLUCOSE, CAPILLARY     Status: Abnormal   Collection Time   03/01/12  7:19 AM      Component Value Range Comment   Glucose-Capillary 124 (*) 70 - 99 mg/dL   GLUCOSE, CAPILLARY     Status: Abnormal   Collection Time   03/01/12 10:43 AM      Component Value Range Comment   Glucose-Capillary 241 (*) 70 - 99 mg/dL    Comment 1 Notify RN     GLUCOSE, CAPILLARY     Status: Abnormal   Collection Time   03/01/12  4:42 PM      Component Value Range Comment   Glucose-Capillary 209 (*) 70 - 99 mg/dL   GLUCOSE, CAPILLARY     Status: Normal   Collection Time   03/01/12  9:10 PM      Component Value Range Comment   Glucose-Capillary 94  70 - 99 mg/dL   GLUCOSE, CAPILLARY     Status: Normal   Collection Time   03/02/12  1:55 AM      Component Value Range Comment   Glucose-Capillary 80  70 - 99 mg/dL    Comment 1 Notify RN       HEENT: normal Cardio: RRR Resp: CTA B/L GI: BS positive Extremity:  No Edema Skin:   Intact Neuro: Alert/Oriented, Abnormal Sensory reduced sensation to lt touch and Abnormal Motor reduced strenth on MMT Musc/Skel:  Other positive impingement R shoulder Back soreness PSIS Lower ext strength 3-/5 in L HF , Quad , TA and Gastroc 4-/5 on R side 5/5 RUE, 4/5 LUE. Overall better memory and awareness -SLR Assessment/Plan: 1. Functional deficits  secondary to R basal ganglia infarct which require 3+ hours per day of interdisciplinary therapy in a comprehensive inpatient rehab setting. Physiatrist is providing close team supervision and 24 hour management of active medical problems listed below. Physiatrist and rehab team continue to assess barriers to discharge/monitor patient progress toward functional and medical goals. FIM: FIM - Bathing Bathing Steps Patient Completed: Chest;Right Arm;Left Arm;Abdomen;Front perineal  area;Buttocks;Right upper leg;Left upper leg;Right lower leg (including foot);Left lower leg (including foot) Bathing: 5: Supervision: Safety issues/verbal cues  FIM - Upper Body Dressing/Undressing Upper body dressing/undressing steps patient completed: Thread/unthread right sleeve of pullover shirt/dresss;Thread/unthread left sleeve of pullover shirt/dress;Put head through opening of pull over shirt/dress;Pull shirt over trunk Upper body dressing/undressing: 5: Set-up assist to: Obtain clothing/put away FIM - Lower Body Dressing/Undressing Lower body dressing/undressing steps patient completed: Thread/unthread right underwear leg;Thread/unthread left underwear leg;Pull underwear up/down;Thread/unthread right pants leg;Thread/unthread left pants leg;Pull pants up/down;Don/Doff right sock;Don/Doff left sock Lower body dressing/undressing: 5: Set-up assist to: Obtain clothing  FIM - Toileting Toileting steps completed by patient: Adjust clothing prior to toileting;Performs perineal hygiene;Adjust clothing after toileting Toileting Assistive Devices: Grab bar or rail for support Toileting: 6: Assistive device: No helper  FIM - Diplomatic Services operational officer Devices: Art gallery manager Transfers: 3-To toilet/BSC: Mod A (lift or lower assist);3-From toilet/BSC: Mod A (lift or lower assist)  FIM - Banker Devices:  (rollator) Bed/Chair Transfer: 4: Supine > Sit: Min A (steadying Pt. > 75%/lift 1 leg);4: Sit > Supine: Min A (steadying pt. > 75%/lift 1 leg);3: Chair or W/C > Bed: Mod A (lift or lower assist);3: Bed > Chair or W/C: Mod A (lift or lower assist)  FIM - Locomotion: Wheelchair Locomotion: Wheelchair: 0: Activity did not occur FIM - Locomotion: Ambulation Locomotion: Ambulation Assistive Devices: Designer, industrial/product Ambulation/Gait Assistance: 5: Supervision Locomotion: Ambulation: 5: Travels 150 ft or more with supervision/safety  issues  Comprehension Comprehension Mode: Auditory Comprehension: 5-Understands complex 90% of the time/Cues < 10% of the time  Expression Expression Mode: Verbal Expression: 5-Expresses complex 90% of the time/cues < 10% of the time  Social Interaction Social Interaction: 5-Interacts appropriately 90% of the time - Needs monitoring or encouragement for participation or interaction.  Problem Solving Problem Solving: 5-Solves complex 90% of the time/cues < 10% of the time  Memory Memory: 4-Recognizes or recalls 75 - 89% of the time/requires cueing 10 - 24% of the time  Medical Problem List and Plan:  1. 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 DVT Prophylaxis/Anticoagulation: SCDs. Monitor for any signs of DVT  3. multiple sclerosis. MRI and C-spine showed no new lesions. Patient on no medication for MS prior to hospital admission  4. Diabetes mellitus uncontrolled. Hemoglobin A1c 10.5. Insulin aspart (novolog) 3 units 3 times a day and NovoLog 7030 reduce 23 units at breakfast, cont 15 units supper.  Needs to take a good hs snack 5. Hypertension. bystolic 10 mg daily. Will add cardura 6. Hyperlipidemia. Lipitor  7. Hypokalemia. Followup labs  8. Thyroid mass. Plan is to followup with Dr. Suszanne Conners for biopsy as an outpatient. Patient would need to be off Plavix x7 days prior to procedure  9. Right rotator cuff disease probable tear, OT for range of motion and scapular stabilization 10.  Back pain improved with therapy and heat and analgesic balm 11. Positive UA and malodorous urine. Patient is afebrile. Cx + Kleb add  keflexLOS (Days) 11 A FACE TO FACE EVALUATION WAS PERFORMED  Linc Renne E 03/02/2012, 7:14 AM

## 2012-03-02 NOTE — Progress Notes (Signed)
Occupational Therapy Note  Patient Details  Name: Hannah Lamb MRN: [id number] Date of Birth: 12/19/1940  Time:  1300-1345  (45 min)   Late entry Individual therapy Pain:  none  Engaged in bathing and dressing at shower stall level.   Addressed functional mobility, standing balance, safety, organizing and sequencing.    Pt. Transferred from wc to shower seat and performed sit to stand for pericare with minimal assist.  Pt. Needed verbal cues for hand placement and posture with standing and sit to stand.   See FIM.   Humberto Seals 02/26/2012, 5:46 PM

## 2012-03-03 LAB — GLUCOSE, CAPILLARY: Glucose-Capillary: 95 mg/dL (ref 70–99)

## 2012-03-03 MED ORDER — GLUCOSE 40 % PO GEL
ORAL | Status: AC
  Administered 2012-03-03: 37.5 g
  Filled 2012-03-03: qty 1

## 2012-03-03 NOTE — Progress Notes (Signed)
Pt and family refusing BP med. Spoke with patient and family and explained the importance of keeping BP under control to prevent stroke. Will continue to monitor. Verita Schneiders [place]

## 2012-03-03 NOTE — Progress Notes (Signed)
Pt refuses to take BP meds, did take keflex. Explained the importance of keeping BP controlled to prevent strokes. Daughter says she doesn't want her to take the BP med.  Hannah Lamb

## 2012-03-03 NOTE — Progress Notes (Signed)
Physical Therapy Session Note  Patient Details  Name: Hannah Lamb MRN: [id number] Date of Birth: 1941/08/13  Today's Date: 03/03/2012 Time: 4098-1191 Time Calculation: 45 minutes  Short Term Goals: Week 1:  PT Short Term Goal 1 (Week 1): Pt will perform bed <> chair transfers with min A PT Short Term Goal 1 - Progress (Week 1): Met PT Short Term Goal 2 (Week 1): Pt will gait in controlled environment with supervision 50' PT Short Term Goal 2 - Progress (Week 1): Met PT Short Term Goal 3 (Week 1): Pt will demo dynamic standing balance for functional task with min A PT Short Term Goal 3 - Progress (Week 1): Met   Therapy Documentation Precautions:  Precautions Precautions: Fall Restrictions Weight Bearing Restrictions: No Pain: No c/o pain  Therapeutic Activity:(15') Transfer training bed to w/c and sit<->stand into Rollator multiple times for reinforcement of technique, with S/Mod-I Therapeutic Exercise:(15') B LE's in sitting, and multiple sets of mini-squats with emphasis on correct trunk posture Gait Training:(15') using Rollator x 150' with S/Mod-I and verbal cues for trunk posture Patient very slow with transfers, gait and mobility but demonstrated safety throughout tx session.   Therapy/Group: Individual Therapy  Rex Kras 03/03/2012, 8:47 AM

## 2012-03-03 NOTE — Significant Event (Signed)
CBG: 53  Treatment: 1 tube instant glucose  Symptoms: Nervous/irritable  Follow-up CBG: Time 2125 CBG Result:95  Possible Reasons for Event: Unknown  Comments/MD notified: no    Ramonita Lab

## 2012-03-03 NOTE — Progress Notes (Signed)
Patient ID: Hannah Lamb, female   DOB: 1941-05-22, 71 y.o.   MRN: [id number]  Subjective/Complaints: No specific complaints this morning.  No pains  Review of Systems  Constitutional: Negative for fever and chills.  Respiratory: Negative for cough, sputum production and shortness of breath.   Cardiovascular:       L lower rib pain last noc  Musculoskeletal:       R shoulder   Objective: Vital Signs: Blood pressure 174/88, pulse 60, temperature 97.5 F (36.4 C), temperature source Oral, resp. rate 18, height 5\' 7"  (1.702 m), weight 190 lb 4.1 oz (86.3 kg), SpO2 96.00%.  No acute distress. Neck is supple. Chest clear to auscultation. Cardiac exam S1-S2 are regular. Abdominal exam active bowel sounds, soft, extremities no edema. Neurologic exam she is alert.  Assessment/Plan: 1. Functional deficits secondary to R basal ganglia infarct which require 3+ hours per day of interdisciplinary therapy in a comprehensive inpatient rehab setting. Physiatrist is providing close team supervision and 24 hour management of active medical problems listed below. Physiatrist and rehab team continue to assess barriers to discharge/monitor patient progress toward functional and medical goals. FIM: FIM - Bathing Bathing Steps Patient Completed: Chest;Right Arm;Left Arm;Abdomen;Front perineal area;Buttocks;Right upper leg;Left upper leg;Right lower leg (including foot);Left lower leg (including foot) Bathing: 5: Supervision: Safety issues/verbal cues  FIM - Upper Body Dressing/Undressing Upper body dressing/undressing steps patient completed: Thread/unthread right sleeve of pullover shirt/dresss;Thread/unthread left sleeve of pullover shirt/dress;Put head through opening of pull over shirt/dress;Pull shirt over trunk Upper body dressing/undressing: 5: Set-up assist to: Obtain clothing/put away FIM - Lower Body Dressing/Undressing Lower body dressing/undressing steps patient completed: Thread/unthread right  underwear leg;Thread/unthread left underwear leg;Pull underwear up/down;Thread/unthread right pants leg;Thread/unthread left pants leg;Pull pants up/down;Don/Doff right sock;Don/Doff left sock Lower body dressing/undressing: 5: Set-up assist to: Obtain clothing  FIM - Toileting Toileting steps completed by patient: Adjust clothing prior to toileting;Performs perineal hygiene;Adjust clothing after toileting Toileting Assistive Devices: Grab bar or rail for support Toileting: 5: Supervision: Safety issues/verbal cues  FIM - Diplomatic Services operational officer Devices: Elevated toilet seat;Walker Toilet Transfers: 5-To toilet/BSC: Supervision (verbal cues/safety issues);5-From toilet/BSC: Supervision (verbal cues/safety issues)  FIM - Banker Devices:  (rollator) Bed/Chair Transfer: 6: Supine > Sit: No assist;6: Sit > Supine: No assist;5: Chair or W/C > Bed: Supervision (verbal cues/safety issues);5: Bed > Chair or W/C: Supervision (verbal cues/safety issues)  FIM - Locomotion: Wheelchair Locomotion: Wheelchair: 0: Activity did not occur FIM - Locomotion: Ambulation Locomotion: Ambulation Assistive Devices: Designer, industrial/product (rollator) Ambulation/Gait Assistance: 5: Supervision Locomotion: Ambulation: 5: Travels 150 ft or more with supervision/safety issues  Comprehension Comprehension Mode: Auditory Comprehension: 5-Understands complex 90% of the time/Cues < 10% of the time  Expression Expression Mode: Verbal Expression: 5-Expresses complex 90% of the time/cues < 10% of the time  Social Interaction Social Interaction: 5-Interacts appropriately 90% of the time - Needs monitoring or encouragement for participation or interaction.  Problem Solving Problem Solving: 5-Solves complex 90% of the time/cues < 10% of the time  Memory Memory: 4-Recognizes or recalls 75 - 89% of the time/requires cueing 10 - 24% of the time  Medical Problem  List and Plan:  1. 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 DVT Prophylaxis/Anticoagulation: SCDs. Monitor for any signs of DVT  3. multiple sclerosis. MRI and C-spine showed no new lesions. Patient on no medication for MS prior  to hospital admission  4. Diabetes mellitus uncontrolled. Hemoglobin A1c 10.5. Insulin aspart (novolog) 3 units 3 times a day and NovoLog 7030 reduce 23 units at breakfast, cont 15 units supper. CBG (last 3)   Basename 03/02/12 2133 03/02/12 1647 03/02/12 1126  GLUCAP 186* 216* 198*   Will increase 7030 insulin.  5. Hypertension. bystolic 10 mg daily. Will add cardura BP Readings from Last 3 Encounters:  03/02/12 174/88  02/20/12 164/69   will monitor for now. She may need additional medications.  6. Hyperlipidemia. Lipitor  7. Hypokalemia. Followup labs  8. Thyroid mass. Plan is to followup with Dr. Suszanne Conners for biopsy as an outpatient. Patient would need to be off Plavix x7 days prior to procedure  9. Right rotator cuff disease probable tear, OT for range of motion and scapular stabilization 10.  Back pain improved 11. Positive UA and malodorous urine. Patient is afebrile. Cx + Kleb add keflex LOS (Days) 12 A FACE TO FACE EVALUATION WAS PERFORMED  Hannah Lamb 03/03/2012, 5:53 AM

## 2012-03-04 LAB — GLUCOSE, CAPILLARY
Glucose-Capillary: 84 mg/dL (ref 70–99)
Glucose-Capillary: 113 mg/dL — ABNORMAL HIGH (ref 70–99)
Glucose-Capillary: 133 mg/dL — ABNORMAL HIGH (ref 70–99)

## 2012-03-04 NOTE — Discharge Summary (Signed)
NAMEPICCOLA, ARICO NO.:  [id number]  MEDICAL RECORD NO.:  [id number]  LOCATION:  4034                         FACILITY:  [facility]  PHYSICIAN:  Mariam Dollar, P.A.  DATE OF BIRTH:  01-03-1941  DATE OF ADMISSION:  02/20/2012 DATE OF DISCHARGE:                              DISCHARGE SUMMARY   DISCHARGE DIAGNOSES: 1. Right basal ganglia as well as scattered right frontal lobe     infarcts.  2. Sequential compression devices for DVT prophylaxis.  3. Klebsiella urinary tract infection.  4. Multiple scleroses.  5. Diabetes mellitus.  6. Hypertension.  7. Hyperlipidemia.  8. Thyroid mass.  9. Right rotator cuff disease.  This is a 71 year old right-handed female with a history of multiple sclerosis diagnosed in 2008, followed at [facility], on no present medications for MS as well as hypertension, who was admitted on February 15, 2012 with generalized weakness to the point where she was unable to ambulate.  MRI of the brain revealed acute infarct involving the right caudate head and anterior aspect of the right lenticular nucleus with small scattered infarcts throughout the right frontal lobe.  There were also small remote infarcts of the basal ganglia, thalami and cerebellum bilaterally with prominent white matter-type changes fairly confluent in the periventricular region that had not progressed since prior exam. MRI of cervical spine, there were no focal cord signal abnormalities. Neurology consulted and placed on Plavix therapy for CVA prophylaxis. Cardiology Services consulted with notable questionable history of syncope.  Carotid Doppler is showing no ICA stenosis.  Echocardiogram with ejection fraction of 70% with grade 1 diastolic dysfunction. Cardiac enzymes were negative.  Bouts of hypokalemia with supplement added.  Venous Doppler study of lower extremities was negative for DVT. Cardiology signed off with workup unremarkable.  Incidental  findings during workup of stroke, cervical MRI showing markedly enlarged thyroid gland with a mask greater on the left, compressing surrounding air column and vascular structures.  An ultrasound of her neck showed enlarged thyroid with multiple nodules and masses.  ENT, Dr. Suszanne Conners, consulted with plan for biopsy.  Interventional Radiology notes that the patient would need to be off Plavix 7 days prior to biopsy, thus current plan was to follow up in 1-2 weeks as outpatient with ENT to discuss thyroid biopsy.  The patient was admitted for comprehensive rehab program.  PAST MEDICAL HISTORY:  See discharge diagnoses.  ALLERGIES:  SULFA and LIPITOR.  SOCIAL HISTORY:  Lives with family, assistance as needed.  FUNCTIONAL STATUS UPON ADMISSION TO REHAB SERVICES:  Independent.  FUNCTIONAL STATUS UPON ADMISSION TO REHAB SERVICES:  While in the hospital, was minimum to moderate assist.  PHYSICAL EXAMINATION:  VITAL SIGNS: Blood pressure 176/83, pulse 63, respirations 18, temperature 98.5. GENERAL:  This was an alert female, no acute distress, oriented to person, place and time, followed 3-step commands appropriate, perhaps a little apraxia.  No gross sensory findings.  LUNGS:  Clear to auscultation. CARDIAC: Regular rate and rhythm.  ABDOMEN:  Soft, nontender.  Good bowel sounds.  REHABILITATION AND HOSPITAL COURSE:  The patient was admitted to Inpatient Rehab Services with therapies initiated on a 3-hour daily basis consisting of physical therapy,  occupational therapy, and rehabilitation nursing.  The following issues were addressed during the patient's rehabilitation stay.  Pertaining to Hannah Lamb right caudate head and anterior aspect of the right lenticular nucleus infarct maintained on Plavix therapy with progressive gains while in [employer].  Sequential compression devices were in place for DVT prophylaxis.  Recent venous Doppler studies were negative.  She did have a history  of multiple sclerosis, diagnosed in 2008, on no present medication.  She would follow up at [facility] of who she was followed with prior to hospital admission.  She did have a history of diabetes mellitus with hemoglobin A1c of 10.5.  She remained on insulin therapy as advised, blood sugars normalizing.  Blood pressures monitored, on Hytrin as well as by [facility].  She did have some variables in her blood pressure.  She was refusing blood pressure medications at times.  It was discussed at length the need for safety of maintaining her antihypertensive medications and following up with her primary care provider.  During her rehab course, she did have a Klebsiella urinary tract infection, she was completing a course of Keflex, she remained afebrile. She continued to refuse her antibiotics at most times. It was discussed at length the need to be compliant with both antihypertensive medication all other prescriptions prescribed.  Incidental findings of thyroid mass during her hospital stay.  She was to follow up with Dr. Suszanne Conners of ENT to discuss biopsy.  She would need to be off Plavix for 7 days prior to biopsy.  The patient received weekly collaborative interdisciplinary team conferences to discuss estimated length of stay, family teaching, and any barriers to discharge.  She was continent of bowel and bladder, supervision with toileting and dressing, steady assist with bathing, moderate assist with tub bench, occasional minimal assist for sit to stand and steps; otherwise, she was supervision with good functional gains.  Full family teaching was completed and plans will be discharged to home.  DISCHARGE MEDICATIONS:  Included Plavix 75 mg daily; insulin aspart 23 units at breakfast, 15 units at supper; Bystolic 10 mg daily; and Hytrin 1 mg at bedtime.  DIET:  Diabetic diet.  SPECIAL INSTRUCTIONS:  The patient should follow up with Dr. Suszanne Conners of ENT for thyroid mass, discuss biopsy of  which she would need to be off of Plavix therapy for 7 days.  She would follow up with Dr. Claudette Laws at the [facility] on March 20, 2012.  She should follow up with her primary care provider, Dr. Mikeal Hawthorne, appointment was made on March 19, 2012.     Mariam Dollar, P.A.     DA/MEDQ  D:  03/04/2012  T:  03/04/2012  Job:  [id number]  cc:   Mohammad Loma Newton

## 2012-03-04 NOTE — Discharge Instructions (Signed)
Inpatient Rehab Discharge Instructions  Hannah Lamb Discharge date and time: No discharge date for patient encounter.   Activities/Precautions/ Functional Status: Activity: activity as tolerated Diet: diabetic diet Wound Care: none needed Functional status:  ___ No restrictions     ___ Walk up steps independently __x_ 24/7 supervision/assistance   _x__ Walk up steps with assistance ___ Intermittent supervision/assistance  ___ Bathe/dress independently ___ Walk with walker     ___ Bathe/dress with assistance ___ Walk Independently    ___ Shower independently __x_ Walk with assistance    _x__ Shower with assistance ___ No alcohol     ___ Return to work/school ________  Special Instructions: Followup Dr. Suszanne Conners of the ENT services in regards to questionable plan thyroid biopsy   COMMUNITY REFERRALS UPON DISCHARGE:    Home Health:   PT,OT,RN     Agency:[facility] Phone:[phone] Date of last service:03/02/2012    Medical Equipment/Items Ordered:BSC  Agency/Supplier:[facility] Other:DAUGHTER TO PURSUE SCOOTER NOT INTERESTED IN TRANSPORT OR [facility]  GENERAL COMMUNITY RESOURCES FOR PATIENT/FAMILY: Support Groups:[facility] SUPPORT GROUP   My questions have been answered and I understand these instructions. I will adhere to these goals and the provided educational materials after my discharge from the hospital.  Patient/Caregiver Signature _______________________________ Date __________  Clinician Signature _______________________________________ Date __________  Please bring this form and your medication list with you to all your follow-up doctor's appointments.

## 2012-03-04 NOTE — Progress Notes (Signed)
Patient ID: Hannah Lamb, female   DOB: October 10, 1940, 71 y.o.   MRN: [id number]  Subjective/Complaints: No specific complaints this morning.  No pains  Review of Systems  Constitutional: Negative for fever and chills.  Respiratory: Negative for cough, sputum production and shortness of breath.   Cardiovascular:       L lower rib pain last noc  Musculoskeletal:       R shoulder   Objective: Vital Signs: Blood pressure 176/83, pulse 63, temperature 98.5 F (36.9 C), temperature source Oral, resp. rate 18, height 5\' 7"  (1.702 m), weight 190 lb 4.1 oz (86.3 kg), SpO2 98.00%.  No acute distress. Neck is supple. Chest clear to auscultation. Cardiac exam S1-S2 are regular. Abdominal exam active bowel sounds, soft, extremities no edema. Neurologic exam she is alert.  Assessment/Plan: 1. Functional deficits secondary to R basal ganglia infarct which require 3+ hours per day of interdisciplinary therapy in a comprehensive inpatient rehab setting. Physiatrist is providing close team supervision and 24 hour management of active medical problems listed below. Physiatrist and rehab team continue to assess barriers to discharge/monitor patient progress toward functional and medical goals. FIM: FIM - Bathing Bathing Steps Patient Completed: Chest;Front perineal area;Abdomen;Left Arm;Right Arm;Right upper leg;Left upper leg;Right lower leg (including foot);Left lower leg (including foot) Bathing: 4: Min-Patient completes 8-9 87f 10 parts or 75+ percent  FIM - Upper Body Dressing/Undressing Upper body dressing/undressing steps patient completed: Thread/unthread right sleeve of pullover shirt/dresss;Thread/unthread left sleeve of pullover shirt/dress;Put head through opening of pull over shirt/dress;Pull shirt over trunk Upper body dressing/undressing: 5: Set-up assist to: Obtain clothing/put away FIM - Lower Body Dressing/Undressing Lower body dressing/undressing steps patient completed: Pull underwear  up/down;Pull pants up/down Lower body dressing/undressing: 4: Min-Patient completed 75 plus % of tasks  FIM - Toileting Toileting steps completed by patient: Adjust clothing prior to toileting;Performs perineal hygiene;Adjust clothing after toileting Toileting Assistive Devices: Grab bar or rail for support Toileting: 5: Supervision: Safety issues/verbal cues  FIM - Diplomatic Services operational officer Devices: Elevated toilet seat Toilet Transfers: 5-To toilet/BSC: Supervision (verbal cues/safety issues)  FIM - Banker Devices:  (rollator) Bed/Chair Transfer: 5: Chair or W/C > Bed: Supervision (verbal cues/safety issues);5: Bed > Chair or W/C: Supervision (verbal cues/safety issues);5: Sit > Supine: Supervision (verbal cues/safety issues);5: Supine > Sit: Supervision (verbal cues/safety issues)  FIM - Locomotion: Wheelchair Locomotion: Wheelchair: 0: Activity did not occur FIM - Locomotion: Ambulation Locomotion: Ambulation Assistive Devices: Designer, industrial/product Ambulation/Gait Assistance: 5: Supervision Locomotion: Ambulation: 5: Travels 150 ft or more with supervision/safety issues  Comprehension Comprehension Mode: Auditory Comprehension: 5-Follows basic conversation/direction: With no assist  Expression Expression Mode: Verbal Expression: 5-Expresses basic needs/ideas: With no assist  Social Interaction Social Interaction: 6-Interacts appropriately with others with medication or extra time (anti-anxiety, antidepressant).  Problem Solving Problem Solving: 5-Solves basic problems: With no assist  Memory Memory: 5-Recognizes or recalls 90% of the time/requires cueing < 10% of the time  Medical Problem List and Plan:  1. 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 DVT Prophylaxis/Anticoagulation: SCDs. Monitor for any signs of DVT  3.  multiple sclerosis. MRI and C-spine showed no new lesions. Patient on no medication for MS prior to hospital admission  4. Diabetes mellitus uncontrolled. Hemoglobin A1c 10.5. Insulin aspart (novolog) 3 units 3 times a day and NovoLog 7030 reduce 23 units at breakfast, cont 15 units supper. CBG (  last 3)   Basename 03/04/12 0731 03/03/12 2141 03/03/12 2040  GLUCAP 84 95 53*   Will increase 7030 insulin.  5. Hypertension. bystolic 10 mg daily. Will add cardura BP Readings from Last 3 Encounters:  03/04/12 176/83  02/20/12 164/69   will monitor for now. She may need additional medications. Reviewed LPN note-- not patient encouraged to be compliant with medications  6. Hyperlipidemia. Lipitor  7. Hypokalemia. Followup labs  8. Thyroid mass. Plan is to followup with Dr. Suszanne Conners for biopsy as an outpatient. Patient would need to be off Plavix x7 days prior to procedure  9. Right rotator cuff disease probable tear, OT for range of motion and scapular stabilization 10.  Back pain improved 11. UTI- Cx + Kleb add keflex LOS (Days) 13 A FACE TO FACE EVALUATION WAS PERFORMED  Hannah Lamb 03/04/2012, 9:18 AM

## 2012-03-04 NOTE — Progress Notes (Signed)
Physical Therapy Note  Patient Details  Name: Hannah Lamb MRN: [id number] Date of Birth: January 06, 1941 Today's Date: 03/04/2012  1300-1355 (55 minutes) group Pain: no complaint of pain Pt participated in PT group session focused on gait training/safety/tolerance and dynamic standing balance activities to improve standing safety. Pt ambulates 80 feet X 1 with rollator min to close supervision and manages AD without cues. Dynamic standing balance- pt performed horseshoe toss and ball toss in standing with unilateral UE assist on AD min assist with no loss of balance.    Ayanni Tun,JIM 03/04/2012, 3:28 PM

## 2012-03-04 NOTE — Discharge Summary (Signed)
Discharge summary job [id number]

## 2012-03-05 LAB — URINE MICROSCOPIC-ADD ON

## 2012-03-05 LAB — URINALYSIS, ROUTINE W REFLEX MICROSCOPIC
Nitrite: POSITIVE — AB
Specific Gravity, Urine: 1.015 (ref 1.005–1.030)
Urobilinogen, UA: 0.2 mg/dL (ref 0.0–1.0)
pH: 7 (ref 5.0–8.0)

## 2012-03-05 LAB — GLUCOSE, CAPILLARY
Glucose-Capillary: 145 mg/dL — ABNORMAL HIGH (ref 70–99)
Glucose-Capillary: 236 mg/dL — ABNORMAL HIGH (ref 70–99)

## 2012-03-05 MED ORDER — TERAZOSIN HCL 1 MG PO CAPS: 1.0000 mg | ORAL_CAPSULE | Freq: Every day | ORAL | Status: DC

## 2012-03-05 MED ORDER — INSULIN ASPART PROT & ASPART (70-30 MIX) 100 UNIT/ML ~~LOC~~ SUSP: 23.0000 [IU] | Freq: Every day | SUBCUTANEOUS | Status: DC

## 2012-03-05 MED ORDER — CEPHALEXIN 250 MG PO CAPS
250.0000 mg | ORAL_CAPSULE | Freq: Three times a day (TID) | ORAL | Status: DC
  Filled 2012-03-05 (×3): qty 1

## 2012-03-05 MED ORDER — INSULIN ASPART PROT & ASPART (70-30 MIX) 100 UNIT/ML ~~LOC~~ SUSP: 15.0000 [IU] | Freq: Every day | SUBCUTANEOUS | Status: DC

## 2012-03-05 NOTE — Progress Notes (Signed)
Patient and family are still refusing blood pressure medication. Blood pressure checked this am manually for 210/96. Spoke with patient and family about their concerns and about the importance of taking the medication. Printed off information on the drug and gave to family. Will continue to monitor. Verita Schneiders [place]

## 2012-03-05 NOTE — Care Management (Signed)
Portable Health Profile   Name: ADANA MARIK       Date Initiated: 03/05/2012 DOB: 09-27-1940  Age: 71 y.o. Address: [address] [place] [address]  Phone:  [phone]  Emergency Contact    Name Relation Home Work Mobile   Lounette, Sloan  [phone]     [facility] Daughter         Insurance    1. MEDICARE MEDICARE PART A AND B      2. MEDICAID [place] MEDICAID [place] ACCESS        Allergies   Allergies  Allergen Reactions  . Sulfa Drugs Cross Reactors dizziness      . Lipitor (Atorvastatin) Muscle weakness        Immunizations on file   Immunization History  Administered Date(s) Administered  . Td 10/18/2003   Primary Care Provider  Dr Lonia Blood  [phone] Other Physicians  Dr Carleene Overlie   [phone] Dr Nicole Cella   [phone]   Other Healthcare Providers/Preference    Hospital Preference    Diagnosis/Conditions from this Admission  Right CVA  Risk Factors/Limitations: vision, hearing, swallowing  Fall risk  Special Instructions  Follow discharge instructions. Take all medications as ordered. Keep follow-up appointments.  Take your Portable Health Profile, discharge instructions and medication list to all medical appointments.  List changes/additions below

## 2012-03-05 NOTE — Progress Notes (Signed)
Patient ID: Hannah Lamb, female   DOB: 25-Aug-1941, 71 y.o.   MRN: [id number]  Subjective/Complaints: Pt refused to take hytrin over the weekend.  Discussed importance of BP management for CVA prevention.  She is aware but still is refusing. Had a short course of Abx for UTI will recheck UA No pains  Review of Systems  Constitutional: Negative for fever and chills.  Respiratory: Negative for cough, sputum production and shortness of breath.   Cardiovascular: Positive for chest pain.       L lower rib pain last noc  Gastrointestinal: Positive for abdominal pain.  Musculoskeletal: Positive for joint pain.       R shoulder  All other systems reviewed and are negative.   Objective: Vital Signs: Blood pressure 210/96, pulse 59, temperature 98.6 F (37 C), temperature source Oral, resp. rate 18, height 5\' 7"  (1.702 m), weight 86.3 kg (190 lb 4.1 oz), SpO2 97.00%. No results found. Results for orders placed during the hospital encounter of 02/20/12 (from the past 72 hour(s))  GLUCOSE, CAPILLARY     Status: Abnormal   Collection Time   03/02/12 11:26 AM      Component Value Range Comment   Glucose-Capillary 198 (*) 70 - 99 mg/dL    Comment 1 Notify RN     GLUCOSE, CAPILLARY     Status: Abnormal   Collection Time   03/02/12  4:47 PM      Component Value Range Comment   Glucose-Capillary 216 (*) 70 - 99 mg/dL   GLUCOSE, CAPILLARY     Status: Abnormal   Collection Time   03/02/12  9:33 PM      Component Value Range Comment   Glucose-Capillary 186 (*) 70 - 99 mg/dL   GLUCOSE, CAPILLARY     Status: Abnormal   Collection Time   03/03/12  7:48 AM      Component Value Range Comment   Glucose-Capillary 101 (*) 70 - 99 mg/dL   GLUCOSE, CAPILLARY     Status: Abnormal   Collection Time   03/03/12 11:27 AM      Component Value Range Comment   Glucose-Capillary 175 (*) 70 - 99 mg/dL   GLUCOSE, CAPILLARY     Status: Abnormal   Collection Time   03/03/12  4:36 PM      Component Value Range  Comment   Glucose-Capillary 102 (*) 70 - 99 mg/dL   GLUCOSE, CAPILLARY     Status: Abnormal   Collection Time   03/03/12  8:40 PM      Component Value Range Comment   Glucose-Capillary 53 (*) 70 - 99 mg/dL    Comment 1 Notify RN     GLUCOSE, CAPILLARY     Status: Normal   Collection Time   03/03/12  9:41 PM      Component Value Range Comment   Glucose-Capillary 95  70 - 99 mg/dL    Comment 1 Notify RN     GLUCOSE, CAPILLARY     Status: Normal   Collection Time   03/04/12  7:31 AM      Component Value Range Comment   Glucose-Capillary 84  70 - 99 mg/dL   GLUCOSE, CAPILLARY     Status: Abnormal   Collection Time   03/04/12 11:32 AM      Component Value Range Comment   Glucose-Capillary 113 (*) 70 - 99 mg/dL   GLUCOSE, CAPILLARY     Status: Abnormal   Collection Time   03/04/12  5:00 PM      Component Value Range Comment   Glucose-Capillary 133 (*) 70 - 99 mg/dL   GLUCOSE, CAPILLARY     Status: Abnormal   Collection Time   03/04/12  9:15 PM      Component Value Range Comment   Glucose-Capillary 66 (*) 70 - 99 mg/dL    Comment 1 Notify RN     GLUCOSE, CAPILLARY     Status: Normal   Collection Time   03/04/12 10:02 PM      Component Value Range Comment   Glucose-Capillary 87  70 - 99 mg/dL    Comment 1 Notify RN     GLUCOSE, CAPILLARY     Status: Abnormal   Collection Time   03/05/12  7:19 AM      Component Value Range Comment   Glucose-Capillary 145 (*) 70 - 99 mg/dL    Comment 1 Notify RN       HEENT: normal Cardio: RRR Resp: CTA B/L GI: BS positive Extremity:  No Edema Skin:   Intact Neuro: Alert/Oriented, Abnormal Sensory reduced sensation to lt touch and Abnormal Motor reduced strenth on MMT Musc/Skel:  Other positive impingement R shoulder Back soreness PSIS Lower ext strength 3-/5 in L HF , Quad , TA and Gastroc 4-/5 on R side 5/5 RUE, 4/5 LUE. Overall better memory and awareness -SLR Assessment/Plan: 1. Functional deficits secondary to R basal ganglia infarct  stable functionally for D/C BP rechecked at 170 systolic, no signs of hyper tensive crisis.  OK to D/C with prescriptions for recommended meds with outpt f/u with PCP for HTN and gen medical PMR for rehab issues     FIM: FIM - Bathing Bathing Steps Patient Completed: Chest;Right Arm;Left Arm;Abdomen;Front perineal area;Buttocks;Right upper leg;Left upper leg;Right lower leg (including foot);Left lower leg (including foot) Bathing: 4: Min-Patient completes 8-9 26f 10 parts or 75+ percent  FIM - Upper Body Dressing/Undressing Upper body dressing/undressing steps patient completed: Thread/unthread right sleeve of pullover shirt/dresss;Pull shirt over trunk;Put head through opening of pull over shirt/dress;Thread/unthread left sleeve of pullover shirt/dress Upper body dressing/undressing: 5: Set-up assist to: Obtain clothing/put away FIM - Lower Body Dressing/Undressing Lower body dressing/undressing steps patient completed: Pull pants up/down;Pull underwear up/down Lower body dressing/undressing: 4: Min-Patient completed 75 plus % of tasks  FIM - Toileting Toileting steps completed by patient: Adjust clothing prior to toileting;Performs perineal hygiene;Adjust clothing after toileting Toileting Assistive Devices: Grab bar or rail for support Toileting: 5: Supervision: Safety issues/verbal cues  FIM - Diplomatic Services operational officer Devices: Elevated toilet seat Toilet Transfers: 5-To toilet/BSC: Supervision (verbal cues/safety issues)  FIM - Banker Devices:  (rollator) Bed/Chair Transfer: 5: Chair or W/C > Bed: Supervision (verbal cues/safety issues);5: Bed > Chair or W/C: Supervision (verbal cues/safety issues);5: Sit > Supine: Supervision (verbal cues/safety issues);5: Supine > Sit: Supervision (verbal cues/safety issues)  FIM - Locomotion: Wheelchair Locomotion: Wheelchair: 0: Activity did not occur FIM - Locomotion:  Ambulation Locomotion: Ambulation Assistive Devices: Designer, industrial/product Ambulation/Gait Assistance: 5: Supervision Locomotion: Ambulation: 5: Travels 150 ft or more with supervision/safety issues  Comprehension Comprehension Mode: Auditory Comprehension: 5-Follows basic conversation/direction: With no assist  Expression Expression Mode: Verbal Expression: 5-Expresses basic needs/ideas: With no assist  Social Interaction Social Interaction: 6-Interacts appropriately with others with medication or extra time (anti-anxiety, antidepressant).  Problem Solving Problem Solving: 5-Solves basic problems: With no assist  Memory Memory: 5-Recognizes or recalls 90% of the time/requires cueing < 10% of the time  Medical Problem List and Plan:  1. 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 DVT Prophylaxis/Anticoagulation: SCDs. Monitor for any signs of DVT  3. multiple sclerosis. MRI and C-spine showed no new lesions. Patient on no medication for MS prior to hospital admission  4. Diabetes mellitus uncontrolled. Hemoglobin A1c 10.5. Insulin aspart (novolog) 3 units 3 times a day and NovoLog 7030 reduce 23 units at breakfast, cont 15 units supper.  Needs to take a good hs snack 5. Hypertension. bystolic 10 mg daily.Added hytrin but not taking.  She is competant to make informed decisions about her medical care.  I offered to extend her stay to monitor BP if she agrees to take the hytrin but she still refused.  Will give an Rx for her in the event that she changes her mind 6. Hyperlipidemia. Lipitor  7. Hypokalemia. Followup labs  8. Thyroid mass. Plan is to followup with Dr. Suszanne Conners for biopsy as an outpatient. Patient would need to be off Plavix x7 days prior to procedure  9. Right rotator cuff disease probable tear, OT for range of motion and scapular stabilization 10.  Back pain improved with therapy and  heat and analgesic balm 11.Hx UTI started refusing keflex after a short course will recheck UA LOS (Days) 14 A FACE TO FACE EVALUATION WAS PERFORMED  Lyriq Finerty E 03/05/2012, 9:11 AM

## 2012-03-05 NOTE — Significant Event (Signed)
CBG: 66  Treatment: 15 GM carbohydrate snack  Symptoms: Nervous/irritable  Follow-up CBG: Time:2200 CBG Result:86  Possible Reasons for Event: Unknown  Comments/MD notified:no   Ramonita Lab

## 2012-03-05 NOTE — Care Management Note (Signed)
Noted pt's hypertension this AM and pt's refusal to take BP med; Dr Doroteo Bradford aware. Noted that Keflex was d/c'd 6/23; reported to Dr Wynn Banker that pt has not received a full week of Keflex.  Pt and daughters have been educated on need for BP meds and abx, handouts have been provided. Pt and family report understanding of risks of non-compliance.

## 2012-03-05 NOTE — Progress Notes (Signed)
Occupational Therapy Session Note  Patient Details  Name: Hannah Lamb MRN: [id number] Date of Birth: Nov 06, 1940  Today's Date: 03/05/2012 Time: 4098-1191 Time Calculation (min): 55 min  Short Term Goals: Week 2:  OT Short Term Goal 1 (Week 2): STG = LTG due to remaining LOS (  Skilled Therapeutic Interventions/Progress Updates:    Pt with hypertension this AM, consulted with RN prior to treatment session.  RN reporting pt refused BP meds but is not presenting with any symptoms and to monitor symptoms during session.  Pt completed ADL retraining at overall sit to stand level with no reports of dizziness or any other symptoms of hypertension.  Educated pt and family on importance of BP management for CVA prevention; she is aware but still is refusing meds.  Pt overall supervision with bathing and dressing at sit to stand level, min verbal cues for sit to stand "nose over toes".  Pt reporting just ready to return home.  Therapy Documentation Precautions:  Precautions Precautions: Fall Restrictions Weight Bearing Restrictions: No General:   Vital Signs: Therapy Vitals Pulse Rate: 70  BP: 184/91 mmHg Patient Position, if appropriate: Sitting Oxygen Therapy SpO2: 98 % O2 Device: None (Room air) Pulse Oximetry Type: Intermittent Pain: Pain Assessment Pain Assessment: No/denies pain ADL: ADL Grooming: Modified independent Where Assessed-Grooming: Sitting at sink;Standing at sink Upper Body Bathing: Supervision/safety Where Assessed-Upper Body Bathing: Shower Lower Body Bathing: Supervision/safety Where Assessed-Lower Body Bathing: Shower Upper Body Dressing: Modified independent (Device) Where Assessed-Upper Body Dressing: Chair Lower Body Dressing: Supervision/safety;Setup Where Assessed-Lower Body Dressing: Chair Toileting: Supervision/safety Where Assessed-Toileting: Teacher, adult education: Close supervision Toilet Transfer Method: Ambulating (with rollator  walker) Acupuncturist: Raised toilet seat Tub/Shower Transfer: Moderate assistance Tub/Shower Transfer Method: Stand pivot Tub/Shower Equipment: Insurance underwriter: Close supervision Film/video editor Method: Warden/ranger: Transfer tub bench  See FIM for current functional status  Therapy/Group: Individual Therapy  Leonette Monarch 03/05/2012, 12:05 PM

## 2012-03-05 NOTE — Progress Notes (Signed)
Occupational Therapy Discharge Summary  Patient Details  Name: Hannah Lamb MRN: [id number] Date of Birth: [birth year]  Today's Date: 03/05/2012 Time: 9629-5284 Time Calculation (min): 55 min  Patient has met 10 of 11 long term goals due to improved activity tolerance, improved balance, ability to compensate for deficits and improved awareness.  Patient to discharge at overall Supervision level.  Patient's care partner is independent to provide the necessary physical and cognitive assistance at discharge.  Patient requires occasional cues for positioning prior to sit <> stand to increase safety and independence.  Patient's daughter has demonstrated appropriate cuing with transfers.   Reasons goals not met: Pt continues to require min assist with intellectual awareness regarding safety and medication management to decrease risk for CVA.  Pt continues to refuse medication for BP and insulin reporting that she feels weak.  Educated pt and her family on the importance of taking these meds as prescribed to decrease risk of another CVA.  Recommendation:  Patient will benefit from ongoing skilled OT services in home health setting to continue to advance functional skills in the area of BADL, iADL and Reduce care partner burden.  Equipment: 3 in 1 BSC  Reasons for discharge: treatment goals met and discharge from hospital  Patient/family agrees with progress made and goals achieved: Yes  OT Discharge Precautions/Restrictions  Precautions Precautions: Fall Restrictions Weight Bearing Restrictions: No General   Vital Signs Therapy Vitals Pulse Rate: 70  BP: 184/91 mmHg Patient Position, if appropriate: Sitting Oxygen Therapy SpO2: 98 % O2 Device: None (Room air) Pulse Oximetry Type: Intermittent Pain Pain Assessment Pain Assessment: No/denies pain ADL ADL Grooming: Modified independent Where Assessed-Grooming: Sitting at sink;Standing at sink Upper Body Bathing:  Supervision/safety Where Assessed-Upper Body Bathing: Shower Lower Body Bathing: Supervision/safety Where Assessed-Lower Body Bathing: Shower Upper Body Dressing: Modified independent (Device) Where Assessed-Upper Body Dressing: Chair Lower Body Dressing: Supervision/safety;Setup Where Assessed-Lower Body Dressing: Chair Toileting: Supervision/safety Where Assessed-Toileting: Teacher, adult education: Close supervision Toilet Transfer Method: Ambulating (with rollator walker) Acupuncturist: Raised toilet seat Tub/Shower Transfer: Moderate assistance Tub/Shower Transfer Method: Stand pivot Tub/Shower Equipment: Insurance underwriter: Close supervision Film/video editor Method: Warden/ranger: Emergency planning/management officer Vision/Perception  Vision - History Baseline Vision: Wears glasses only for reading Patient Visual Report: No change from baseline Vision - Assessment Eye Alignment: Within Functional Limits Perception Perception: Within Functional Limits Praxis Praxis: Intact Praxis-Other Comments: WFL  Cognition Overall Cognitive Status: Appears within functional limits for tasks assessed Orientation Level: Oriented X4 Sensation Sensation Light Touch: Appears Intact Stereognosis: Appears Intact Hot/Cold: Appears Intact Proprioception: Appears Intact Motor  Motor Motor: Hemiplegia;Abnormal postural alignment and control Motor - Discharge Observations: rt shoulder hiked due to premorbid rotator cuff injury per pt.report Mobility  Bed Mobility Rolling Right: 5: Supervision Supine to Sit: 5: Supervision Transfers Sit to Stand: 5: Supervision Sit to Stand Details: Verbal cues for sequencing;Verbal cues for technique;Verbal cues for precautions/safety Sit to Stand Details (indicate cue type and reason): vc for foot placement and anterior weigth shift. Stand to Sit: 5: Supervision Stand to Sit Details: vc for foot  placement and anterior weight shift.  Trunk/Postural Assessment  Postural Control Postural Control: Within Functional Limits  Balance Balance Balance Assessed: Yes Static Sitting Balance Static Sitting - Balance Support: Feet supported Static Sitting - Level of Assistance: 7: Independent Static Standing Balance Static Standing - Balance Support: Bilateral upper extremity supported Static Standing - Level of Assistance: 5: Stand by assistance Dynamic  Standing Balance Dynamic Standing - Level of Assistance: 5: Stand by assistance (rollator walker) Extremity/Trunk Assessment RUE Assessment RUE Assessment: Exceptions to [facility] (grossly impaired d/t RTC injury 12/2010) LUE Assessment LUE Assessment: Within Functional Limits  See FIM for current functional status  Leonette Monarch 03/05/2012, 12:11 PM

## 2012-03-05 NOTE — Progress Notes (Signed)
Social Work  Discharge Note  The overall goal for the admission was met for:   Discharge location: Yes  Length of Stay: Yes  Discharge activity level: Yes  Home/community participation: Yes  Services provided included: MD, RD, PT, OT, RN, CM, TR, Pharmacy and SW  Financial Services: Medicare and Medicaid  Follow-up services arranged: Home Health: RN, PT, OT via [facility], DME: 3n1 commode - [facility] and Patient/Family has no preference for HH/DME agencies  Comments (or additional information):  Patient/Family verbalized understanding of follow-up arrangements: Yes  Individual responsible for coordination of the follow-up plan: patient and daughter  Confirmed correct DME delivered: Amada Jupiter 03/05/2012    [name]

## 2012-03-05 NOTE — Progress Notes (Signed)
Physical Therapy Discharge Summary  Patient Details  Name: Hannah Lamb MRN: [id number] Date of Birth: [birth year]  Today's Date: 03/05/2012 Time:  - 9:30-10:15  45 minutes    Patient has met 6 of 6 long term goals due to improved activity tolerance, improved balance and increased strength.  Patient to discharge at an ambulatory level Supervision.   Patient's care partner is independent to provide the necessary supervision assistance at discharge. Pt was unable to participate in graduation day activities on day of discharge due to elevated blood pressure at 184/91 and refusal. Pt had met all goals prior to discharge day. Pt has been noncompliant with medications per nursing and doctor reports. Education was provided to family and pt about the relationship between high blood pressure and risk of stroke. They verbalized understanding. Doctor verbally gave parameters of therapy of systolic BP being less than 180 and offered extending hospital stay to manage uncontrolled hypertension, but pt and family refused.  Reasons goals not met: goals met on previous days.  Recommendation:  Patient will benefit from ongoing skilled PT services in home health setting to continue to advance safe functional mobility, address ongoing impairments in strength, balance, motor control, safety and minimize fall risk.  Equipment: pt owns rollator walker and power wc, recommended transport chair for the community.  Reasons for discharge: discharge from hospital  Patient/family agrees with progress made and goals achieved: Yes  PT Discharge Precautions/Restrictions Precautions Precautions: Fall Restrictions Weight Bearing Restrictions: No Vital Signs Therapy Vitals Pulse Rate: 70  BP: 184/91 mmHg Patient Position, if appropriate: Sitting Oxygen Therapy SpO2: 98 % O2 Device: None (Room air) Pulse Oximetry Type: Intermittent Pain Pain Assessment Pain Assessment: No/denies pain Vision/Perception  Vision  - History Baseline Vision: Wears glasses only for reading Patient Visual Report: No change from baseline Vision - Assessment Eye Alignment: Within Functional Limits Perception Perception: Within Functional Limits Praxis Praxis: Intact Praxis-Other Comments: WFL  Cognition Overall Cognitive Status: Appears within functional limits for tasks assessed Orientation Level: Oriented X4 Sensation Sensation Light Touch: Appears Intact Stereognosis: Appears Intact Hot/Cold: Appears Intact Proprioception: Appears Intact Motor  Motor Motor: Hemiplegia;Abnormal postural alignment and control Motor - Discharge Observations: rt shoulder hiked due to premorbid rotator cuff injury per pt.report  Mobility Bed Mobility Rolling Right: 5: Supervision Supine to Sit: 5: Supervision Transfers Sit to Stand: 5: Supervision Sit to Stand Details: Verbal cues for sequencing;Verbal cues for technique;Verbal cues for precautions/safety Sit to Stand Details (indicate cue type and reason): vc for foot placement and anterior weigth shift. Stand to Sit: 5: Supervision Stand to Sit Details: vc for foot placement and anterior weight shift. Stand Pivot Transfers: 5: Supervision (rollator walker) Squat Pivot Transfers: Not tested (comment) Locomotion  Ambulation Ambulation/Gait Assistance: 5: Supervision Ambulation Distance (Feet): 150 Feet Assistive device: 4-wheeled walker Stairs / Additional Locomotion Stairs: Yes Stairs Assistance: 4: Min guard Stair Management Technique: Two rails Number of Stairs: 12  Ramp: 4: Min assist (rollator walker) Curb: 4: Min assist (rollator walker) Wheelchair Mobility Wheelchair Mobility: No  Trunk/Postural Assessment  Postural Control Postural Control: Within Functional Limits  Balance Balance Balance Assessed: Yes Static Sitting Balance Static Sitting - Balance Support: Feet supported Static Sitting - Level of Assistance: 7: Independent Static Standing  Balance Static Standing - Balance Support: Bilateral upper extremity supported Static Standing - Level of Assistance: 5: Stand by assistance Dynamic Standing Balance Dynamic Standing - Level of Assistance: 5: Stand by assistance (rollator walker) Extremity Assessment      RLE  Assessment RLE Assessment:  (grossly 3/5) LLE Assessment LLE Assessment:  (grossly 3/5)  See FIM for current functional status  [name] 03/05/2012, 10:26 AM

## 2012-03-20 ENCOUNTER — Inpatient Hospital Stay: Payer: Medicare Other | Admitting: Physical Medicine & Rehabilitation

## 2012-03-20 ENCOUNTER — Encounter: Payer: Medicare Other | Attending: Physical Medicine & Rehabilitation

## 2012-03-30 DIAGNOSIS — I10 Essential (primary) hypertension: Secondary | ICD-10-CM

## 2012-03-30 DIAGNOSIS — E119 Type 2 diabetes mellitus without complications: Secondary | ICD-10-CM

## 2012-03-30 DIAGNOSIS — G35 Multiple sclerosis: Secondary | ICD-10-CM

## 2012-03-30 DIAGNOSIS — Z0271 Encounter for disability determination: Secondary | ICD-10-CM

## 2012-03-30 DIAGNOSIS — M6281 Muscle weakness (generalized): Secondary | ICD-10-CM

## 2013-01-06 ENCOUNTER — Inpatient Hospital Stay (HOSPITAL_COMMUNITY)
Admission: EM | Admit: 2013-01-06 | Discharge: 2013-01-08 | DRG: 305 | Disposition: A | Discharge status: Home Health | Payer: Medicare Other | Attending: Internal Medicine | Admitting: Internal Medicine

## 2013-01-06 ENCOUNTER — Encounter (HOSPITAL_COMMUNITY): Payer: Self-pay | Admitting: Emergency Medicine

## 2013-01-06 ENCOUNTER — Emergency Department (HOSPITAL_COMMUNITY): Payer: Medicare Other

## 2013-01-06 DIAGNOSIS — Z23 Encounter for immunization: Secondary | ICD-10-CM

## 2013-01-06 DIAGNOSIS — R531 Weakness: Secondary | ICD-10-CM

## 2013-01-06 DIAGNOSIS — Z888 Allergy status to other drugs, medicaments and biological substances status: Secondary | ICD-10-CM

## 2013-01-06 DIAGNOSIS — IMO0002 Reserved for concepts with insufficient information to code with codable children: Secondary | ICD-10-CM

## 2013-01-06 DIAGNOSIS — E1165 Type 2 diabetes mellitus with hyperglycemia: Secondary | ICD-10-CM

## 2013-01-06 DIAGNOSIS — E876 Hypokalemia: Secondary | ICD-10-CM | POA: Diagnosis present

## 2013-01-06 DIAGNOSIS — Z794 Long term (current) use of insulin: Secondary | ICD-10-CM

## 2013-01-06 DIAGNOSIS — G35 Multiple sclerosis: Secondary | ICD-10-CM

## 2013-01-06 DIAGNOSIS — Z833 Family history of diabetes mellitus: Secondary | ICD-10-CM

## 2013-01-06 DIAGNOSIS — Z8673 Personal history of transient ischemic attack (TIA), and cerebral infarction without residual deficits: Secondary | ICD-10-CM

## 2013-01-06 DIAGNOSIS — M129 Arthropathy, unspecified: Secondary | ICD-10-CM

## 2013-01-06 DIAGNOSIS — E785 Hyperlipidemia, unspecified: Secondary | ICD-10-CM | POA: Diagnosis present

## 2013-01-06 DIAGNOSIS — M25511 Pain in right shoulder: Secondary | ICD-10-CM

## 2013-01-06 DIAGNOSIS — Z882 Allergy status to sulfonamides status: Secondary | ICD-10-CM

## 2013-01-06 DIAGNOSIS — I10 Essential (primary) hypertension: Principal | ICD-10-CM

## 2013-01-06 DIAGNOSIS — E049 Nontoxic goiter, unspecified: Secondary | ICD-10-CM | POA: Diagnosis present

## 2013-01-06 DIAGNOSIS — Z7982 Long term (current) use of aspirin: Secondary | ICD-10-CM

## 2013-01-06 DIAGNOSIS — M25519 Pain in unspecified shoulder: Secondary | ICD-10-CM | POA: Diagnosis present

## 2013-01-06 DIAGNOSIS — E7800 Pure hypercholesterolemia, unspecified: Secondary | ICD-10-CM

## 2013-01-06 DIAGNOSIS — R9431 Abnormal electrocardiogram [ECG] [EKG]: Secondary | ICD-10-CM

## 2013-01-06 DIAGNOSIS — N390 Urinary tract infection, site not specified: Secondary | ICD-10-CM

## 2013-01-06 DIAGNOSIS — IMO0001 Reserved for inherently not codable concepts without codable children: Secondary | ICD-10-CM | POA: Diagnosis present

## 2013-01-06 DIAGNOSIS — Z79899 Other long term (current) drug therapy: Secondary | ICD-10-CM

## 2013-01-06 DIAGNOSIS — I161 Hypertensive emergency: Secondary | ICD-10-CM

## 2013-01-06 HISTORY — DX: Acute myocardial infarction, unspecified: I21.9

## 2013-01-06 HISTORY — DX: Cerebral infarction, unspecified: I63.9

## 2013-01-06 HISTORY — DX: Anxiety disorder, unspecified: F41.9

## 2013-01-06 LAB — POCT I-STAT, CHEM 8
BUN: 13 mg/dL (ref 6–23)
Chloride: 102 mEq/L (ref 96–112)
Creatinine, Ser: 0.6 mg/dL (ref 0.50–1.10)
Glucose, Bld: 237 mg/dL — ABNORMAL HIGH (ref 70–99)
HCT: 40 % (ref 36.0–46.0)
Potassium: 3.4 mEq/L — ABNORMAL LOW (ref 3.5–5.1)

## 2013-01-06 LAB — CBC WITH DIFFERENTIAL/PLATELET
Basophils Absolute: 0 10*3/uL (ref 0.0–0.1)
Basophils Relative: 1 % (ref 0–1)
Eosinophils Absolute: 0.2 10*3/uL (ref 0.0–0.7)
Eosinophils Relative: 3 % (ref 0–5)
HCT: 37.9 % (ref 36.0–46.0)
Hemoglobin: 13.1 g/dL (ref 12.0–15.0)
MCH: 28.4 pg (ref 26.0–34.0)
MCHC: 34.6 g/dL (ref 30.0–36.0)
Monocytes Absolute: 0.4 10*3/uL (ref 0.1–1.0)
Monocytes Relative: 7 % (ref 3–12)
RDW: 13.7 % (ref 11.5–15.5)

## 2013-01-06 LAB — URINALYSIS, ROUTINE W REFLEX MICROSCOPIC
Specific Gravity, Urine: 1.016 (ref 1.005–1.030)
Urobilinogen, UA: 1 mg/dL (ref 0.0–1.0)

## 2013-01-06 LAB — MRSA PCR SCREENING: MRSA by PCR: NEGATIVE

## 2013-01-06 LAB — URINE MICROSCOPIC-ADD ON

## 2013-01-06 LAB — GLUCOSE, CAPILLARY
Glucose-Capillary: 250 mg/dL — ABNORMAL HIGH (ref 70–99)
Glucose-Capillary: 217 mg/dL — ABNORMAL HIGH (ref 70–99)

## 2013-01-06 MED ORDER — TERAZOSIN HCL 1 MG PO CAPS
1.0000 mg | ORAL_CAPSULE | Freq: Once | ORAL | Status: AC
  Administered 2013-01-06: 1 mg via ORAL
  Filled 2013-01-06: qty 1

## 2013-01-06 MED ORDER — ONDANSETRON HCL 4 MG/2ML IJ SOLN: 4.0000 mg | Freq: Three times a day (TID) | INTRAMUSCULAR | Status: AC | PRN

## 2013-01-06 MED ORDER — POTASSIUM CHLORIDE CRYS ER 20 MEQ PO TBCR
40.0000 meq | EXTENDED_RELEASE_TABLET | Freq: Once | ORAL | Status: AC
  Administered 2013-01-06: 40 meq via ORAL

## 2013-01-06 MED ORDER — SODIUM CHLORIDE 0.9 % IJ SOLN: 3.0000 mL | Freq: Two times a day (BID) | INTRAMUSCULAR | Status: DC

## 2013-01-06 MED ORDER — NITROGLYCERIN IN D5W 200-5 MCG/ML-% IV SOLN
5.0000 ug/min | Freq: Once | INTRAVENOUS | Status: AC
  Administered 2013-01-06: 5 ug/min via INTRAVENOUS
  Filled 2013-01-06: qty 250

## 2013-01-06 MED ORDER — POTASSIUM CHLORIDE CRYS ER 20 MEQ PO TBCR
20.0000 meq | EXTENDED_RELEASE_TABLET | Freq: Two times a day (BID) | ORAL | Status: DC
  Administered 2013-01-06 – 2013-01-08 (×4): 20 meq via ORAL
  Filled 2013-01-06 (×4): qty 1
  Filled 2013-01-06: qty 2
  Filled 2013-01-06: qty 1

## 2013-01-06 MED ORDER — NEBIVOLOL HCL 10 MG PO TABS
10.0000 mg | ORAL_TABLET | Freq: Once | ORAL | Status: AC
  Administered 2013-01-06: 10 mg via ORAL
  Filled 2013-01-06: qty 1

## 2013-01-06 MED ORDER — ACETAMINOPHEN 325 MG PO TABS
650.0000 mg | ORAL_TABLET | Freq: Four times a day (QID) | ORAL | Status: DC | PRN
  Administered 2013-01-07: 650 mg via ORAL
  Filled 2013-01-06: qty 1
  Filled 2013-01-06 (×2): qty 2

## 2013-01-06 MED ORDER — SODIUM CHLORIDE 0.9 % IJ SOLN
3.0000 mL | Freq: Two times a day (BID) | INTRAMUSCULAR | Status: DC
  Administered 2013-01-06: 3 mL via INTRAVENOUS

## 2013-01-06 MED ORDER — ASPIRIN 325 MG PO TABS
ORAL_TABLET | ORAL | Status: AC
  Filled 2013-01-06: qty 1

## 2013-01-06 MED ORDER — NICARDIPINE HCL IN NACL 20-0.86 MG/200ML-% IV SOLN
5.0000 mg/h | Freq: Once | INTRAVENOUS | Status: AC
  Administered 2013-01-06: 5 mg/h via INTRAVENOUS
  Filled 2013-01-06: qty 200

## 2013-01-06 MED ORDER — INSULIN ASPART 100 UNIT/ML ~~LOC~~ SOLN: 0.0000 [IU] | Freq: Three times a day (TID) | SUBCUTANEOUS | Status: DC

## 2013-01-06 MED ORDER — METOPROLOL TARTRATE 25 MG PO TABS
25.0000 mg | ORAL_TABLET | Freq: Two times a day (BID) | ORAL | Status: DC
  Administered 2013-01-06 (×2): 25 mg via ORAL
  Filled 2013-01-06 (×4): qty 1

## 2013-01-06 MED ORDER — ACETAMINOPHEN 650 MG RE SUPP: 650.0000 mg | Freq: Four times a day (QID) | RECTAL | Status: DC | PRN

## 2013-01-06 MED ORDER — AMLODIPINE BESYLATE 10 MG PO TABS
10.0000 mg | ORAL_TABLET | Freq: Every day | ORAL | Status: DC
  Administered 2013-01-06 – 2013-01-08 (×3): 10 mg via ORAL
  Filled 2013-01-06 (×3): qty 1

## 2013-01-06 MED ORDER — TERAZOSIN HCL 1 MG PO CAPS
1.0000 mg | ORAL_CAPSULE | Freq: Every day | ORAL | Status: DC
  Administered 2013-01-06 – 2013-01-07 (×2): 1 mg via ORAL
  Filled 2013-01-06 (×3): qty 1

## 2013-01-06 MED ORDER — ASPIRIN EC 325 MG PO TBEC
325.0000 mg | DELAYED_RELEASE_TABLET | Freq: Every day | ORAL | Status: DC
  Administered 2013-01-06 – 2013-01-08 (×3): 325 mg via ORAL
  Filled 2013-01-06 (×4): qty 1

## 2013-01-06 MED ORDER — ENOXAPARIN SODIUM 40 MG/0.4ML ~~LOC~~ SOLN
40.0000 mg | SUBCUTANEOUS | Status: DC
  Administered 2013-01-06 – 2013-01-07 (×2): 40 mg via SUBCUTANEOUS
  Filled 2013-01-06 (×3): qty 0.4

## 2013-01-06 MED ORDER — SODIUM CHLORIDE 0.9 % IJ SOLN: 3.0000 mL | INTRAMUSCULAR | Status: DC | PRN

## 2013-01-06 MED ORDER — NITROGLYCERIN IN D5W 200-5 MCG/ML-% IV SOLN: 2.0000 ug/min | INTRAVENOUS | Status: DC

## 2013-01-06 MED ORDER — SODIUM CHLORIDE 0.9 % IV SOLN: 250.0000 mL | INTRAVENOUS | Status: DC | PRN

## 2013-01-06 MED ORDER — INSULIN ASPART 100 UNIT/ML ~~LOC~~ SOLN
0.0000 [IU] | Freq: Three times a day (TID) | SUBCUTANEOUS | Status: DC
  Administered 2013-01-06: 8 [IU] via SUBCUTANEOUS
  Administered 2013-01-07: 11 [IU] via SUBCUTANEOUS

## 2013-01-06 MED ORDER — SODIUM CHLORIDE 0.9 % IV SOLN
Freq: Once | INTRAVENOUS | Status: AC
  Administered 2013-01-06: 12:00:00 via INTRAVENOUS

## 2013-01-06 NOTE — ED Notes (Signed)
Patient transported to CT 

## 2013-01-06 NOTE — ED Provider Notes (Signed)
History     CSN: [id number]  Arrival date & time 01/06/13  1122   First MD Initiated Contact with Patient 01/06/13 1125      Chief Complaint  Patient presents with  . Hypertension    (Consider location/radiation/quality/duration/timing/severity/associated sxs/prior treatment) HPI Comments: 72 year old female with a history of hypertension, multiple sclerosis and diabetes who presents with a complaint of dizziness and a strange feeling in her head. She states that she has a history of hypertension which has been treated with several different medications however she has recently run out of some of these medications. This morning her blood pressure was extremely high, paramedics were called and found her blood pressure to be 236/110. She was not given medications in route, she complains of generalized fatigue and weakness and a swimmy headed feeling but denies focal weakness, vomiting, nausea, fever, chills, cough, shortness of breath. Nothing seems to make this better, worse with not taking her medications  Patient is a 72 y.o. female presenting with hypertension. The history is provided by the patient.  Hypertension    Past Medical History  Diagnosis Date  . Hypertension   . MS (multiple sclerosis)   . Diabetes mellitus   . Goiter   . Hyperlipidemia     Past Surgical History  Procedure Laterality Date  . Abdominal hysterectomy      Family History  Problem Relation Age of Onset  . Diabetes Father     History  Substance Use Topics  . Smoking status: Never Smoker   . Smokeless tobacco: Not on file  . Alcohol Use: No    OB History   Grav Para Term Preterm Abortions TAB SAB Ect Mult Living                  Review of Systems  All other systems reviewed and are negative.    Allergies  Sulfa drugs cross reactors and Lipitor  Home Medications   Current Outpatient Rx  Name  Route  Sig  Dispense  Refill  . aspirin EC 325 MG tablet   Oral   Take 325 mg by mouth  daily.         . Azilsartan Medoxomil (EDARBI) 40 MG TABS   Oral   Take 40 mg by mouth daily.         . nebivolol (BYSTOLIC) 10 MG tablet   Oral   Take 10 mg by mouth daily.         . potassium chloride (MICRO-K) 10 MEQ CR capsule   Oral   Take 10 mEq by mouth 2 (two) times daily.         Marland Kitchen terazosin (HYTRIN) 1 MG capsule   Oral   Take 1 mg by mouth at bedtime.           BP 170/85  Pulse 80  Temp(Src) 98.5 F (36.9 C)  Resp 19  SpO2 95%  Physical Exam  Nursing note and vitals reviewed. Constitutional: She appears well-developed and well-nourished. No distress.  HENT:  Head: Normocephalic and atraumatic.  Mouth/Throat: Oropharynx is clear and moist. No oropharyngeal exudate.  Eyes: Conjunctivae and EOM are normal. Pupils are equal, round, and reactive to light. Right eye exhibits no discharge. Left eye exhibits no discharge. No scleral icterus.  Neck: Normal range of motion. Neck supple. No JVD present. No thyromegaly present.  Cardiovascular: Normal rate, regular rhythm, normal heart sounds and intact distal pulses.  Exam reveals no gallop and no friction rub.  No murmur heard. Pulmonary/Chest: Effort normal and breath sounds normal. No respiratory distress. She has no wheezes. She has no rales.  Abdominal: Soft. Bowel sounds are normal. She exhibits no distension and no mass. There is no tenderness.  Musculoskeletal: Normal range of motion. She exhibits no edema and no tenderness.  Lymphadenopathy:    She has no cervical adenopathy.  Neurological: She is alert. Coordination normal.  Skin: Skin is warm and dry. No rash noted. No erythema.  Psychiatric: She has a normal mood and affect. Her behavior is normal.    ED Course  Procedures (including critical care time)  Labs Reviewed  POCT I-STAT, CHEM 8 - Abnormal; Notable for the following:    Potassium 3.4 (*)    Glucose, Bld 237 (*)    All other components within normal limits  CBC WITH DIFFERENTIAL   APTT  PROTIME-INR  TROPONIN I  POCT I-STAT TROPONIN I   Ct Head Wo Contrast  01/06/2013  *RADIOLOGY REPORT*  Clinical Data: 72 year old female with ataxia and hypertension. Multiple prior cerebral infarcts.  CT HEAD WITHOUT CONTRAST  Technique:  Contiguous axial images were obtained from the base of the skull through the vertex without contrast.  Comparison: 02/16/2012 CT and MRI  Findings: Mild chronic small vessel white matter ischemic changes and remote bilateral basal ganglia, periventricular white matter and right cerebellar infarcts again noted.  No acute intracranial abnormalities are identified, including mass lesion or mass effect, hydrocephalus, extra-axial fluid collection, midline shift, hemorrhage, or acute infarction.  The visualized bony calvarium is unremarkable.  IMPRESSION: No evidence of acute intracranial abnormality.  Chronic small vessel white matter ischemic changes and remote infarcts.   Original Report Authenticated By: Harmon Pier, M.D.    Dg Chest Port 1 View  01/06/2013  *RADIOLOGY REPORT*  Clinical Data: Chest pain.  PORTABLE CHEST - 1 VIEW  Comparison: Chest x-ray 02/15/2012.  Findings: Lung volumes are normal.  There are linear opacities in the left base, similar to the prior examination, favored to represent areas of mild chronic scarring.  No acute consolidative airspace disease.  No pleural effusions.  Pulmonary vasculature is within normal limits.  Mild cardiomegaly.  Tortuosity atherosclerosis of the thoracic aorta. The patient is rotated to the left on today's exam, resulting in distortion of the mediastinal contours and reduced diagnostic sensitivity and specificity for mediastinal pathology.  IMPRESSION: 1.  No radiographic evidence of acute cardiopulmonary disease. 2.  Mild cardiomegaly. 3.  Atherosclerosis.   Original Report Authenticated By: Trudie Reed, M.D.      1. Hypertension    #2 hyperglycemia    MDM  The patient has an essentially normal  neurologic exam at this time but appears uncomfortable and when addressed at her up she is off balance. We'll perform a CT scan of the brain, start treating her hypertension with intravenous Cardene drip, labs ordered, EKG pending.  ED ECG REPORT  I personally interpreted this EKG   Date: 01/06/2013   Rate: 81  Rhythm: normal sinus rhythm  QRS Axis: left  Intervals: normal  ST/T Wave abnormalities: nonspecific ST/T changes  Conduction Disutrbances:none  Narrative Interpretation: Left ventricular hypertrophy with repolarization abnormality is present  Old EKG Reviewed: since 02/19/12, no sig change   D/w DR. Short - agrees withi admission - will stop cardene as have had good improvement - currently 170/s over 110's.  Sx gradually improved.  Cardene stopped, blood pressure and again rose to 200/100, nitroglycerin drip initiated at request of hospitalist, patient has no  recurrence of any significant symptoms and is able to tolerate orals applesauce and food at this time. Diabetic medications ordered  Due to the requirement of several different medication drips to control blood pressure critical care being provided.  CRITICAL CARE Performed by: Vida Roller   Total critical care time: 35  Critical care time was exclusive of separately billable procedures and treating other patients.  Critical care was necessary to treat or prevent imminent or life-threatening deterioration.  Critical care was time spent personally by me on the following activities: development of treatment plan with patient and/or surrogate as well as nursing, discussions with consultants, evaluation of patient's response to treatment, examination of patient, obtaining history from patient or surrogate, ordering and performing treatments and interventions, ordering and review of laboratory studies, ordering and review of radiographic studies, pulse oximetry and re-evaluation of patient's condition.   Vida Roller,  MD 01/06/13 [phone]

## 2013-01-06 NOTE — ED Notes (Signed)
Patient given turkey sandwich

## 2013-01-06 NOTE — ED Notes (Signed)
EMS was called to pt's house for elevated blood pressure. [name] got a systolic pressure of 196 and the pt c/o generalized weakness, and feeling "swimmy headed". Denies any pain, but states she has been fatigued. Vitals 236/100, 80HR, CBG 210.

## 2013-01-06 NOTE — H&P (Addendum)
[facility] History and Physical  MERRISSA GIACOBBE ZOX:[id number] DOB: 03-22-1941 DOA: 01/06/2013  Referring physician: ED PCP: Lonia Blood, MD   Chief Complaint:  uncontrolled HTN X 1 day  HPI:  72 year-old female with history of uncontrolled hypertension or diabetes mellitus, history of CVA in 2013, multiple sclerosis who was brought in by her daughter after symptoms of left eye redness and and indications since this morning. This was followed by epigastric discomfort. On evaluation by EMS patient was found to have malignant hypertension with a pressure of 230/123mmhg. patient brought to the ED where her blood pressure was 220/130 as well and started on nicardipine drip. As per the ED physician patient had symptoms   of dizziness with elevated blood pressure which improved after it was controlled. Patient's daughter informs that her Systolic blood pressure fluctuates from 140-190 at home.  Patient denies any headache, blurred vision, dizziness, tinnitus, nausea, vomiting, chest pain, shortness of breath, orthopnea, PND, bowel or urinary symptoms. Denies any weakness of extremities. She did feel some palpitations and epigastric discomfort. Of note the patient was admitted in June last year it will read malignant hypertension and found to have  right-sided stroke.  Labs done in the ED were unremarkable except for mild hypokalemia. EKG was unchanged from prior.   Patient's systolic blood pressure was still in 200s and she was switched to nitroglycerin drip and prior hospital is called for admission to step down for closer monitoring of her blood pressure.  Review of Systems:  Constitutional: Denies fever, chills, diaphoresis, appetite change and fatigue.  HEENT: Denies photophobia, left eye redness and irritation, , hearing loss, ear pain, congestion, sore throat, rhinorrhea, sneezing, mouth sores, trouble swallowing, neck pain, neck stiffness and tinnitus.   Respiratory: Denies SOB, DOE,  cough, chest tightness,  and wheezing.   Cardiovascular: c/o palpitations, has chronic leg edema,  Denies chest pain,  Gastrointestinal: Denies nausea, vomiting, abdominal pain, diarrhea, constipation, blood in stool and abdominal distention.  Genitourinary: Denies dysuria, urgency, frequency, hematuria, flank pain and difficulty urinating.  Musculoskeletal: Denies myalgias, back pain, joint swelling, arthralgias. uses walker for unsteady gait.  Skin: Denies pallor, rash and wound.  Neurological: Denies dizziness, seizures, syncope, weakness, light-headedness, numbness and headaches.  Hematological: Denies adenopathy. Easy bruising, personal or family bleeding history  Psychiatric/Behavioral: Denies suicidal ideation, mood changes, confusion, nervousness, sleep disturbance and agitation   Past Medical History  Diagnosis Date  . Hypertension   . MS (multiple sclerosis)   . Diabetes mellitus   . Goiter   . Hyperlipidemia    Past Surgical History  Procedure Laterality Date  . Abdominal hysterectomy     Social History:  reports that she has never smoked. She does not have any smokeless tobacco history on file. She reports that she does not drink alcohol or use illicit drugs.  Allergies  Allergen Reactions  . Sulfa Drugs Cross Reactors     dizziness  . Lipitor (Atorvastatin) Other (See Comments)    Muscle weakness    Family History  Problem Relation Age of Onset  . Diabetes Father     Prior to Admission medications   Medication Sig Start Date End Date Taking? Authorizing Provider  aspirin EC 325 MG tablet Take 325 mg by mouth daily.   Yes Historical Provider, MD  Azilsartan Medoxomil (EDARBI) 40 MG TABS Take 40 mg by mouth daily.   Yes Historical Provider, MD  nebivolol (BYSTOLIC) 10 MG tablet Take 10 mg by mouth daily.   Yes Historical  Provider, MD  potassium chloride (MICRO-K) 10 MEQ CR capsule Take 10 mEq by mouth 2 (two) times daily.   Yes Historical Provider, MD  terazosin  (HYTRIN) 1 MG capsule Take 1 mg by mouth at bedtime. 03/05/12 03/05/13 Yes Mcarthur Rossetti Angiulli, PA-C    Physical Exam:  Filed Vitals:   01/06/13 1430 01/06/13 1445 01/06/13 1500 01/06/13 1515  BP: 201/95 231/86 171/81 193/94  Pulse: 73 76 72 76  Temp:      Resp: 16 16 21 20   SpO2: 100% 100% 99% 100%    Constitutional: Vital signs reviewed.  Patient is a well-developed and well-nourished in no acute distress and cooperative with exam. Alert and oriented x3.  Head: Normocephalic and atraumatic Mouth: no erythema or exudates, MMM Eyes: PERRL, EOMI, conjunctivae normal, No scleral icterus.  Neck: Supple, Trachea midline normal ROM, No JVD, mass, thyromegaly, or carotid bruit present.  Cardiovascular: RRR, S1 normal, S2 normal, no MRG, pulses symmetric and intact bilaterally Pulmonary/Chest: CTAB, no wheezes, rales, or rhonchi Abdominal: Soft. Non-tender, non-distended, bowel sounds are normal, no masses, organomegaly, or guarding present.  GU: no CVA tenderness Musculoskeletal: No joint deformities, erythema, or stiffness, ROM full and no nontender Ext: 1+ edema b/l ( chronic per patient) ,  and no cyanosis, pulses palpable bilaterally  Hematology: no cervical, inginal, or axillary adenopathy.  Neurological: A&O x3, Strenght is normal and symmetric bilaterally, cranial nerve II-XII are grossly intact, no focal motor deficit, sensory intact to light touch bilaterally.  Skin: Warm, dry and intact. No rash, cyanosis, or clubbing.  Psychiatric: Normal mood and affect. speech and behavior is normal. Judgment and thought content normal. Cognition and memory are normal.   Labs on Admission:  Basic Metabolic Panel:  Recent Labs Lab 01/06/13 1215  NA 142  K 3.4*  CL 102  GLUCOSE 237*  BUN 13  CREATININE 0.60   Liver Function Tests: No results found for this basename: AST, ALT, ALKPHOS, BILITOT, PROT, ALBUMIN,  in the last 168 hours No results found for this basename: LIPASE, AMYLASE,  in  the last 168 hours No results found for this basename: AMMONIA,  in the last 168 hours CBC:  Recent Labs Lab 01/06/13 1127 01/06/13 1215  WBC 5.6  --   NEUTROABS 2.6  --   HGB 13.1 13.6  HCT 37.9 40.0  MCV 82.0  --   PLT 163  --    Cardiac Enzymes:  Recent Labs Lab 01/06/13 1127  TROPONINI <0.30   BNP: No components found with this basename: POCBNP,  CBG: No results found for this basename: GLUCAP,  in the last 168 hours  Radiological Exams on Admission: Ct Head Wo Contrast  01/06/2013  *RADIOLOGY REPORT*  Clinical Data: 72 year old female with ataxia and hypertension. Multiple prior cerebral infarcts.  CT HEAD WITHOUT CONTRAST  Technique:  Contiguous axial images were obtained from the base of the skull through the vertex without contrast.  Comparison: 02/16/2012 CT and MRI  Findings: Mild chronic small vessel white matter ischemic changes and remote bilateral basal ganglia, periventricular white matter and right cerebellar infarcts again noted.  No acute intracranial abnormalities are identified, including mass lesion or mass effect, hydrocephalus, extra-axial fluid collection, midline shift, hemorrhage, or acute infarction.  The visualized bony calvarium is unremarkable.  IMPRESSION: No evidence of acute intracranial abnormality.  Chronic small vessel white matter ischemic changes and remote infarcts.   Original Report Authenticated By: Harmon Pier, M.D.    Dg Chest Port 1 View  01/06/2013  *  RADIOLOGY REPORT*  Clinical Data: Chest pain.  PORTABLE CHEST - 1 VIEW  Comparison: Chest x-ray 02/15/2012.  Findings: Lung volumes are normal.  There are linear opacities in the left base, similar to the prior examination, favored to represent areas of mild chronic scarring.  No acute consolidative airspace disease.  No pleural effusions.  Pulmonary vasculature is within normal limits.  Mild cardiomegaly.  Tortuosity atherosclerosis of the thoracic aorta. The patient is rotated to the left on  today's exam, resulting in distortion of the mediastinal contours and reduced diagnostic sensitivity and specificity for mediastinal pathology.  IMPRESSION: 1.  No radiographic evidence of acute cardiopulmonary disease. 2.  Mild cardiomegaly. 3.  Atherosclerosis.   Original Report Authenticated By: Trudie Reed, M.D.     EKG: NSR , LVH, TWI in lateral leads unchanged  Assessment/Plan  Principal Problem:   Malignant hypertension Patient initially started on cardene drip and BP improved . However was again severely uncontrolled an started on nitroglycerine drip.   admit to stepdown. Patient denies any neurological symptoms and has no focal neurological deficit on exam. Head CT unremarkable for acute findings.  patient on Hytrin, bystolic and ARB at home.  i will switch bystolic to metoprolol. Hold ARB now. Will start on 10 mg amlodipine daily. Titrate nitroglycerin drip per unit protocol.   Active Problems: Diabetes mellitus  on NPH bid. informs fsg between 130-180 but has los fsg in am. Will place on SSI. Check A1C Discussed options of lantus which she wants to address with her PCP.   HX of CVA  in 2013  no residual weakness  continue ASA   ? Hx of MS Has some generalized weakness at baseline. Uses walker.     DVT prophylaxis : sq lovenox  Diet: Diabetic  Code Status: full Code Family Communication: Daughter at bedside Disposition Plan: Home once stable  Eddie North [facility] Pager [phone]  If 7PM-7AM, please contact night-coverage [email or web address] Password [facility] 01/06/2013, 3:37 PM   Total time spent: 70 minutes

## 2013-01-06 NOTE — ED Notes (Signed)
Pt c/o left eye redness and a feeling of something in her eye. Pt states her rt shoulder is beginning to hurt her, but denies any other pain.

## 2013-01-07 ENCOUNTER — Inpatient Hospital Stay (HOSPITAL_COMMUNITY): Payer: Medicare Other

## 2013-01-07 DIAGNOSIS — M25519 Pain in unspecified shoulder: Secondary | ICD-10-CM | POA: Diagnosis present

## 2013-01-07 DIAGNOSIS — N390 Urinary tract infection, site not specified: Secondary | ICD-10-CM | POA: Diagnosis present

## 2013-01-07 DIAGNOSIS — I10 Essential (primary) hypertension: Secondary | ICD-10-CM

## 2013-01-07 LAB — GLUCOSE, CAPILLARY: Glucose-Capillary: 109 mg/dL — ABNORMAL HIGH (ref 70–99)

## 2013-01-07 LAB — HEMOGLOBIN A1C: Hgb A1c MFr Bld: 11.4 % — ABNORMAL HIGH (ref <5.7)

## 2013-01-07 MED ORDER — METFORMIN HCL 500 MG PO TABS
500.0000 mg | ORAL_TABLET | Freq: Two times a day (BID) | ORAL | Status: DC
  Administered 2013-01-07 – 2013-01-08 (×3): 500 mg via ORAL
  Filled 2013-01-07 (×5): qty 1

## 2013-01-07 MED ORDER — INSULIN ASPART 100 UNIT/ML ~~LOC~~ SOLN: 0.0000 [IU] | Freq: Every day | SUBCUTANEOUS | Status: DC

## 2013-01-07 MED ORDER — LIVING WELL WITH DIABETES BOOK
Freq: Once | Status: AC
  Administered 2013-01-07: 17:00:00
  Filled 2013-01-07: qty 1

## 2013-01-07 MED ORDER — IRBESARTAN 300 MG PO TABS
300.0000 mg | ORAL_TABLET | Freq: Every day | ORAL | Status: DC
  Administered 2013-01-07 – 2013-01-08 (×2): 300 mg via ORAL
  Filled 2013-01-07 (×2): qty 1

## 2013-01-07 MED ORDER — INSULIN ASPART 100 UNIT/ML ~~LOC~~ SOLN
3.0000 [IU] | Freq: Three times a day (TID) | SUBCUTANEOUS | Status: DC
  Administered 2013-01-07 – 2013-01-08 (×4): 3 [IU] via SUBCUTANEOUS

## 2013-01-07 MED ORDER — INSULIN ASPART 100 UNIT/ML ~~LOC~~ SOLN
0.0000 [IU] | Freq: Three times a day (TID) | SUBCUTANEOUS | Status: DC
  Administered 2013-01-07: 3 [IU] via SUBCUTANEOUS
  Administered 2013-01-08: 4 [IU] via SUBCUTANEOUS

## 2013-01-07 MED ORDER — HYDROCODONE-ACETAMINOPHEN 5-325 MG PO TABS
1.0000 | ORAL_TABLET | Freq: Four times a day (QID) | ORAL | Status: DC | PRN
  Administered 2013-01-07: 1 via ORAL
  Administered 2013-01-08: 2 via ORAL
  Administered 2013-01-08: 1 via ORAL
  Filled 2013-01-07 (×3): qty 1
  Filled 2013-01-07: qty 2

## 2013-01-07 MED ORDER — NEBIVOLOL HCL 10 MG PO TABS
10.0000 mg | ORAL_TABLET | Freq: Every day | ORAL | Status: DC
  Administered 2013-01-07 – 2013-01-08 (×2): 10 mg via ORAL
  Filled 2013-01-07 (×2): qty 1

## 2013-01-07 MED ORDER — INSULIN GLARGINE 100 UNIT/ML ~~LOC~~ SOLN
10.0000 [IU] | Freq: Every day | SUBCUTANEOUS | Status: DC
  Administered 2013-01-07 – 2013-01-08 (×2): 10 [IU] via SUBCUTANEOUS
  Filled 2013-01-07 (×2): qty 0.1

## 2013-01-07 MED ORDER — CIPROFLOXACIN HCL 750 MG PO TABS
750.0000 mg | ORAL_TABLET | Freq: Two times a day (BID) | ORAL | Status: DC
  Administered 2013-01-07 – 2013-01-08 (×4): 750 mg via ORAL
  Filled 2013-01-07 (×6): qty 1

## 2013-01-07 MED ORDER — HYDRALAZINE HCL 20 MG/ML IJ SOLN: 10.0000 mg | INTRAMUSCULAR | Status: DC | PRN

## 2013-01-07 NOTE — Progress Notes (Signed)
PT Cancellation Note  Patient Details Name: GABI MCFATE MRN: [id number] DOB: Jun 07, 1941   Cancelled Treatment:    Reason Eval/Treat Not Completed: Other (Orders received for PT consult re:Shoulder evaluation; typically in our Acute care setting shoulder evaluations performed by OT. Paged provider recommending OT consult. PT to sign-off.)   Fabio Asa 01/07/2013, 3:03 PM Charlotte Crumb, PT DPT  [phone]

## 2013-01-07 NOTE — Care Management Note (Addendum)
    Page 1 of 2   01/08/2013     4:30:34 PM   CARE MANAGEMENT NOTE 01/08/2013  Patient:  Hannah Lamb, Hannah Lamb   Account Number:  [id number]  Date Initiated:  01/07/2013  Documentation initiated by:  Junius Creamer  Subjective/Objective Assessment:   adm w htn     Action/Plan:   lives w fam-da, pcp dr Mikeal Hawthorne   Anticipated DC Date:  01/08/2013   Anticipated DC Plan:  HOME W HOME HEALTH SERVICES      DC Planning Services  CM consult      [facility] Choice  HOME HEALTH   Choice offered to / List presented to:  C-1 Patient   DME arranged  [facility] - MANUAL      DME agency  [facility]     [facility] arranged  HH-1 RN      [facility] agency  [facility]   Status of service:  Completed, signed off Medicare Important Message given?   (If response is "NO", the following Medicare IM given date fields will be blank) Date Medicare IM given:   Date Additional Medicare IM given:    Discharge Disposition:  HOME W HOME HEALTH SERVICES  Per UR Regulation:  Reviewed for med. necessity/level of care/duration of stay  If discussed at Long Length of Stay Meetings, dates discussed:    Comments:  4/28 0955 debbie dowell rn,bsn  01/08/13 Porfiria Heinrich,RN,BSN [phone] PT FOR DC HOME TODAY.  NEEDS WC FOR HOME  AND HHRN. REFERRAL TO [facility], PER CHOICE.  START OF CARE 24-48H POST DC DATE.  REF TO [facility] FOR DME NEEDS.

## 2013-01-07 NOTE — Progress Notes (Signed)
Inpatient Diabetes Program Recommendations  AACE/ADA: New Consensus Statement on Inpatient Glycemic Control (2013)  Target Ranges:  Prepandial:   less than 140 mg/dL      Peak postprandial:   less than 180 mg/dL (1-2 hours)      Critically ill patients:  140 - 180 mg/dL   Reason for Visit: Consult received.  Elevated Hgb A1C  Note: In talking with patient and daughters, they have little insight into how insulin works and diabetes in general.  Discussed composition of pre-mixed insulin (she states she took 70/30 at home) and why an hour after she took her insulin and ate her breakfast, her blood sugar was even higher because the insulin had not had time yet to work fully.    Patient states she is willing to take four shots a day -- a Lantus/Novolog regimen-- as long as she understands how much to take of the insulins and when.  Patient states she would like a Home Health nurse to come and help her understand if she goes home on this regimen.  Instructed them in accessing the Patient Ed Channel and asked that they begin watching diabetes videos.  Will come back tomorrow and follow-up.   Thank you for the consult. Rene Sizelove S. Elsie Lincoln, RN, CNS, CDE Inpatient Diabetes Program, team pager [phone]
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
## 2013-01-07 NOTE — Progress Notes (Signed)
Pt complains of pain to the right arm.  She rates the pain at a 10/10. Per the patient and family request the MD was called for pain medicine in addition to Tylenol.  Orders were received for Vicodin.  Pt was offered Vicodin but her daughter requested that she not receive it at this time and the patient then reports that she has no pain at all.  Will continue to monitor.

## 2013-01-07 NOTE — Progress Notes (Signed)
Pt arrives to floor via bed accompanied by techs.  No s/s of any acute distress or c/o pain upon arrival to floor.  Telemetry connected to pt.  Family at bedside.

## 2013-01-07 NOTE — Progress Notes (Signed)
Report was called to RN on unit 2000. Pt will transfer with family to room 2032.

## 2013-01-08 DIAGNOSIS — M129 Arthropathy, unspecified: Secondary | ICD-10-CM

## 2013-01-08 LAB — BASIC METABOLIC PANEL
BUN: 18 mg/dL (ref 6–23)
Calcium: 9.2 mg/dL (ref 8.4–10.5)
Creatinine, Ser: 0.88 mg/dL (ref 0.50–1.10)
GFR calc Af Amer: 74 mL/min — ABNORMAL LOW (ref >90)
GFR calc non Af Amer: 64 mL/min — ABNORMAL LOW (ref >90)
Potassium: 4.2 mEq/L (ref 3.5–5.1)

## 2013-01-08 LAB — GLUCOSE, CAPILLARY: Glucose-Capillary: 118 mg/dL — ABNORMAL HIGH (ref 70–99)

## 2013-01-08 MED ORDER — TERAZOSIN HCL 1 MG PO CAPS: 1.0000 mg | ORAL_CAPSULE | Freq: Every day | ORAL | Status: DC

## 2013-01-08 MED ORDER — CIPROFLOXACIN HCL 750 MG PO TABS: 750.0000 mg | ORAL_TABLET | Freq: Two times a day (BID) | ORAL | Status: DC

## 2013-01-08 MED ORDER — HYDROCODONE-ACETAMINOPHEN 5-325 MG PO TABS: 1.0000 | ORAL_TABLET | Freq: Four times a day (QID) | ORAL | Status: DC | PRN

## 2013-01-08 MED ORDER — AMLODIPINE BESYLATE 10 MG PO TABS: 10.0000 mg | ORAL_TABLET | Freq: Every day | ORAL | Status: DC

## 2013-01-08 MED ORDER — METFORMIN HCL 500 MG PO TABS: 500.0000 mg | ORAL_TABLET | Freq: Two times a day (BID) | ORAL | Status: DC

## 2013-01-08 MED ORDER — INSULIN GLARGINE 100 UNIT/ML ~~LOC~~ SOLN: 10.0000 [IU] | Freq: Every day | SUBCUTANEOUS | Status: DC

## 2013-01-08 MED ORDER — IRBESARTAN 300 MG PO TABS: 300.0000 mg | ORAL_TABLET | Freq: Every day | ORAL | Status: AC

## 2013-01-08 NOTE — Progress Notes (Signed)
Inpatient Diabetes Program Recommendations  AACE/ADA: New Consensus Statement on Inpatient Glycemic Control (2013)  Target Ranges:  Prepandial:   less than 140 mg/dL      Peak postprandial:   less than 180 mg/dL (1-2 hours)      Critically ill patients:  140 - 180 mg/dL   Reason for Visit: Follow-up  Note: Returned to see patient and daughter to see if they had any questions after seeing Pt Ed videos.  They watched only the video "Basic Skills for Controlling Diabetes, but have no questions.  Reiterated that patient will be changed to Lantus at discharge-- no 70/30.  Home Health RN to come and make sure they thoroughly understand meds and med changes at home.  They thanked me for my follow-up visit.  Thank you.  Mataeo Ingwersen S. Elsie Lincoln, RN, CNS, CDE Inpatient Diabetes Program, team pager [phone]
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
## 2013-01-08 NOTE — Progress Notes (Signed)
OT Cancellation Note  Patient Details Name: Hannah Lamb MRN: [id number] DOB: [birth year]   Cancelled Treatment:    Reason Eval/Treat Not Completed: Other (comment) (Dtr refused OT stating pt does not feel well after receiving  Medications.  Explained reason for OT (pain shoulder), but dtr continued to refuse asking therapist to return later.  Note that pt has orders for discharge.  Will re-attempt as able.   Jeani Hawking M [phone] 01/08/2013, 11:13 AM

## 2013-01-09 LAB — URINE CULTURE: Colony Count: 30000

## 2013-05-02 ENCOUNTER — Ambulatory Visit: Payer: Medicare Other | Admitting: Physical Therapy

## 2014-04-20 ENCOUNTER — Emergency Department (HOSPITAL_COMMUNITY)
Admission: EM | Admit: 2014-04-20 | Discharge: 2014-04-20 | Disposition: A | Discharge status: Home / Self Care | Payer: Medicare Other | Attending: Emergency Medicine | Admitting: Emergency Medicine

## 2014-04-20 DIAGNOSIS — R55 Syncope and collapse: Secondary | ICD-10-CM

## 2014-04-20 DIAGNOSIS — I10 Essential (primary) hypertension: Secondary | ICD-10-CM | POA: Diagnosis not present

## 2014-04-20 DIAGNOSIS — I252 Old myocardial infarction: Secondary | ICD-10-CM | POA: Insufficient documentation

## 2014-04-20 DIAGNOSIS — Z79899 Other long term (current) drug therapy: Secondary | ICD-10-CM | POA: Diagnosis not present

## 2014-04-20 DIAGNOSIS — Z8669 Personal history of other diseases of the nervous system and sense organs: Secondary | ICD-10-CM | POA: Diagnosis not present

## 2014-04-20 DIAGNOSIS — Z7982 Long term (current) use of aspirin: Secondary | ICD-10-CM | POA: Insufficient documentation

## 2014-04-20 DIAGNOSIS — E119 Type 2 diabetes mellitus without complications: Secondary | ICD-10-CM | POA: Diagnosis not present

## 2014-04-20 DIAGNOSIS — Z8673 Personal history of transient ischemic attack (TIA), and cerebral infarction without residual deficits: Secondary | ICD-10-CM | POA: Insufficient documentation

## 2014-04-20 DIAGNOSIS — R404 Transient alteration of awareness: Secondary | ICD-10-CM | POA: Diagnosis present

## 2014-04-20 LAB — CBC WITH DIFFERENTIAL/PLATELET
BASOS ABS: 0 10*3/uL (ref 0.0–0.1)
Basophils Relative: 1 % (ref 0–1)
EOS PCT: 2 % (ref 0–5)
Eosinophils Absolute: 0.1 10*3/uL (ref 0.0–0.7)
HEMATOCRIT: 39.8 % (ref 36.0–46.0)
Hemoglobin: 13.6 g/dL (ref 12.0–15.0)
LYMPHS PCT: 31 % (ref 12–46)
Lymphs Abs: 1.8 10*3/uL (ref 0.7–4.0)
MCH: 28.7 pg (ref 26.0–34.0)
MCHC: 34.2 g/dL (ref 30.0–36.0)
MCV: 84 fL (ref 78.0–100.0)
MONO ABS: 0.3 10*3/uL (ref 0.1–1.0)
Monocytes Relative: 5 % (ref 3–12)
Neutro Abs: 3.6 10*3/uL (ref 1.7–7.7)
Neutrophils Relative %: 61 % (ref 43–77)
Platelets: 210 10*3/uL (ref 150–400)
RBC: 4.74 MIL/uL (ref 3.87–5.11)
RDW: 14.1 % (ref 11.5–15.5)
WBC: 5.9 10*3/uL (ref 4.0–10.5)

## 2014-04-20 LAB — URINALYSIS, ROUTINE W REFLEX MICROSCOPIC
BILIRUBIN URINE: NEGATIVE
GLUCOSE, UA: NEGATIVE mg/dL
HGB URINE DIPSTICK: NEGATIVE
KETONES UR: NEGATIVE mg/dL
Nitrite: NEGATIVE
PROTEIN: NEGATIVE mg/dL
Specific Gravity, Urine: 1.019 (ref 1.005–1.030)
Urobilinogen, UA: 1 mg/dL (ref 0.0–1.0)
pH: 5.5 (ref 5.0–8.0)

## 2014-04-20 LAB — BASIC METABOLIC PANEL
ANION GAP: 13 (ref 5–15)
BUN: 21 mg/dL (ref 6–23)
CALCIUM: 9.5 mg/dL (ref 8.4–10.5)
CO2: 26 meq/L (ref 19–32)
CREATININE: 0.69 mg/dL (ref 0.50–1.10)
Chloride: 104 mEq/L (ref 96–112)
GFR calc Af Amer: >90 mL/min (ref >90)
GFR calc non Af Amer: 84 mL/min — ABNORMAL LOW (ref >90)
GLUCOSE: 110 mg/dL — AB (ref 70–99)
Potassium: 3.1 mEq/L — ABNORMAL LOW (ref 3.7–5.3)
Sodium: 143 mEq/L (ref 137–147)

## 2014-04-20 LAB — URINE MICROSCOPIC-ADD ON

## 2014-04-20 LAB — CBG MONITORING, ED: Glucose-Capillary: 106 mg/dL — ABNORMAL HIGH (ref 70–99)

## 2014-04-20 MED ORDER — POTASSIUM CHLORIDE CRYS ER 20 MEQ PO TBCR
40.0000 meq | EXTENDED_RELEASE_TABLET | Freq: Once | ORAL | Status: AC
  Administered 2014-04-20: 40 meq via ORAL
  Filled 2014-04-20: qty 2

## 2014-04-20 NOTE — ED Notes (Signed)
Triage calling, pt and daughter are at desk needing ambulance transport back to home.  Pt was discharged earlier and no mention was made at that time.

## 2014-04-20 NOTE — ED Provider Notes (Signed)
CSN: [id number]     Arrival date & time 04/20/14  1435 History   First MD Initiated Contact with Patient 04/20/14 1436     Chief Complaint  Patient presents with  . Loss of Consciousness      HPI Patient presents the emergency department after an episode of syncope while outside of church.  She was sitting in her wheelchair which she uses secondary to multiple sclerosis.  She had her feet down on the ground.  She was speaking with friends and family.  She reports she felt slightly lightheaded and dizzy and then passed out.  Daughter reports that she took her down to the ground and the patient regained consciousness.  No seizure activity was noted.  No bowel or bladder incontinence.  No tongue biting.  No preceding palpitations.  She reports that she's been eating and drinking normally over the past several days.  No fevers or chills.  No nausea vomiting or diarrhea.  She felt normal today when she woke up and fell while at church as well.  She has no complaints at this time.  Family reports baseline mental status.  She's had several similar episodes to this in the past 4-8 weeks.  She was evaluated for these episodes.  She has not discussed these with her physician.  No preceding palpitations or preceding symptoms for these either.   Past Medical History  Diagnosis Date  . Hypertension   . MS (multiple sclerosis)   . Diabetes mellitus   . Goiter   . Hyperlipidemia   . Myocardial infarction   . Anxiety   . Stroke    Past Surgical History  Procedure Laterality Date  . Abdominal hysterectomy     Family History  Problem Relation Age of Onset  . Diabetes Father    History  Substance Use Topics  . Smoking status: Never Smoker   . Smokeless tobacco: Not on file  . Alcohol Use: No   OB History   Grav Para Term Preterm Abortions TAB SAB Ect Mult Living                 Review of Systems  All other systems reviewed and are negative.     Allergies  Sulfa drugs cross reactors and  Lipitor  Home Medications   Prior to Admission medications   Medication Sig Start Date End Date Taking? Authorizing Provider  amLODipine (NORVASC) 10 MG tablet Take 1 tablet (10 mg total) by mouth daily. 01/08/13  Yes Laveda Normanhris N Oti, MD  aspirin EC 325 MG tablet Take 325 mg by mouth daily.   Yes Historical Provider, MD  HUMULIN 70/30 (70-30) 100 UNIT/ML injection Inject 15 Units into the skin daily. 03/17/14  Yes Historical Provider, MD  irbesartan (AVAPRO) 300 MG tablet Take 1 tablet (300 mg total) by mouth daily. 01/08/13  Yes Laveda Normanhris N Oti, MD   BP 158/71  Pulse 66  Temp(Src) 99 F (37.2 C) (Oral)  Resp 21  Ht 5\' 7"  (1.702 m)  Wt 182 lb (82.555 kg)  BMI 28.50 kg/m2  SpO2 99% Physical Exam  Nursing note and vitals reviewed. Constitutional: She is oriented to person, place, and time. She appears well-developed and well-nourished. No distress.  HENT:  Head: Normocephalic and atraumatic.  Eyes: EOM are normal.  Neck: Normal range of motion.  Cardiovascular: Normal rate, regular rhythm and normal heart sounds.   Pulmonary/Chest: Effort normal and breath sounds normal.  Abdominal: Soft. She exhibits no distension. There is no tenderness.  Musculoskeletal: Normal range of motion.  Neurological: She is alert and oriented to person, place, and time.  Skin: Skin is warm and dry.  Psychiatric: She has a normal mood and affect. Judgment normal.    ED Course  Procedures (including critical care time) Labs Review Labs Reviewed  URINALYSIS, ROUTINE W REFLEX MICROSCOPIC - Abnormal; Notable for the following:    APPearance CLOUDY (*)    Leukocytes, UA MODERATE (*)    All other components within normal limits  BASIC METABOLIC PANEL - Abnormal; Notable for the following:    Potassium 3.1 (*)    Glucose, Bld 110 (*)    GFR calc non Af Amer 84 (*)    All other components within normal limits  CBG MONITORING, ED - Abnormal; Notable for the following:    Glucose-Capillary 106 (*)    All other  components within normal limits  CBC WITH DIFFERENTIAL  URINE MICROSCOPIC-ADD ON    Imaging Review No results found.   EKG Interpretation   Date/Time:  Sunday April 20 2014 14:45:51 EDT Ventricular Rate:  72 PR Interval:  215 QRS Duration: 106 QT Interval:  411 QTC Calculation: 450 R Axis:   -35 Text Interpretation:  Sinus rhythm Borderline prolonged PR interval  Abnormal R-wave progression, late transition LVH with secondary  repolarization abnormality Anterior ST elevation, probably due to LVH No  significant change was found Confirmed by Saraphina Lauderbaugh  MD, Ethelean Colla ([id number]) on  04/20/2014 2:49:42 PM      MDM   Final diagnoses:  Syncope, unspecified syncope type    5:15 PM Patient continues to feel good.  No clear etiology for her syncope.  Observed on a cardiac monitor here.  No arrhythmias noted.  Services for syncope rule places her in a low risk category.  Outpatient PCP and cardiology followup.  She may benefit from a Holter monitor    Lyanne Co, MD 04/20/14 [phone]

## 2014-04-20 NOTE — ED Notes (Signed)
PTAR leaving ED now.

## 2014-04-20 NOTE — ED Notes (Signed)
PTAR at bedside, report given.  

## 2014-04-20 NOTE — ED Notes (Addendum)
To room via EMS.  Pt sitting in pew at church and started feeling dizzy and passed out x few seconds.  Pt c/o pain on top of shoulders which is usual for pt.  No other s/s noted at this time.  Does not feel dizzy since syncope.  Pt has had several episodes of synope in July.  Pt was not evaluated for these episodes.

## 2014-04-20 NOTE — ED Notes (Signed)
CBG 106 

## 2014-04-20 NOTE — Discharge Instructions (Signed)
Syncope °Syncope is a medical term for fainting or passing out. This means you lose consciousness and drop to the ground. People are generally unconscious for less than 5 minutes. You may have some muscle twitches for up to 15 seconds before waking up and returning to normal. Syncope occurs more often in older adults, but it can happen to anyone. While most causes of syncope are not dangerous, syncope can be a sign of a serious medical problem. It is important to seek medical care.  °CAUSES  °Syncope is caused by a sudden drop in blood flow to the brain. The specific cause is often not determined. Factors that can bring on syncope include: °· Taking medicines that lower blood pressure. °· Sudden changes in posture, such as standing up quickly. °· Taking more medicine than prescribed. °· Standing in one place for too long. °· Seizure disorders. °· Dehydration and excessive exposure to heat. °· Low blood sugar (hypoglycemia). °· Straining to have a bowel movement. °· Heart disease, irregular heartbeat, or other circulatory problems. °· Fear, emotional distress, seeing blood, or severe pain. °SYMPTOMS  °Right before fainting, you may: °· Feel dizzy or light-headed. °· Feel nauseous. °· See all white or all black in your field of vision. °· Have cold, clammy skin. °DIAGNOSIS  °Your health care provider will ask about your symptoms, perform a physical exam, and perform an electrocardiogram (ECG) to record the electrical activity of your heart. Your health care provider may also perform other heart or blood tests to determine the cause of your syncope which may include: °· Transthoracic echocardiogram (TTE). During echocardiography, sound waves are used to evaluate how blood flows through your heart. °· Transesophageal echocardiogram (TEE). °· Cardiac monitoring. This allows your health care provider to monitor your heart rate and rhythm in real time. °· Holter monitor. This is a portable device that records your  heartbeat and can help diagnose heart arrhythmias. It allows your health care provider to track your heart activity for several days, if needed. °· Stress tests by exercise or by giving medicine that makes the heart beat faster. °TREATMENT  °In most cases, no treatment is needed. Depending on the cause of your syncope, your health care provider may recommend changing or stopping some of your medicines. °HOME CARE INSTRUCTIONS °· Have someone stay with you until you feel stable. °· Do not drive, use machinery, or play sports until your health care provider says it is okay. °· Keep all follow-up appointments as directed by your health care provider. °· Lie down right away if you start feeling like you might faint. Breathe deeply and steadily. Wait until all the symptoms have passed. °· Drink enough fluids to keep your urine clear or pale yellow. °· If you are taking blood pressure or heart medicine, get up slowly and take several minutes to sit and then stand. This can reduce dizziness. °SEEK IMMEDIATE MEDICAL CARE IF:  °· You have a severe headache. °· You have unusual pain in the chest, abdomen, or back. °· You are bleeding from your mouth or rectum, or you have black or tarry stool. °· You have an irregular or very fast heartbeat. °· You have pain with breathing. °· You have repeated fainting or seizure-like jerking during an episode. °· You faint when sitting or lying down. °· You have confusion. °· You have trouble walking. °· You have severe weakness. °· You have vision problems. °If you fainted, call your local emergency services (911 in [place] Do not drive   yourself to the hospital.  °MAKE SURE YOU: °· Understand these instructions. °· Will watch your condition. °· Will get help right away if you are not doing well or get worse. °Document Released: 08/29/2005 Document Revised: 09/03/2013 Document Reviewed: 10/28/2011 °[employer]® Patient Information ©2015 [employer]. This information is not intended to replace  advice given to you by your health care provider. Make sure you discuss any questions you have with your health care provider. ° °

## 2014-06-03 ENCOUNTER — Ambulatory Visit: Payer: Medicare Other | Admitting: Cardiovascular Disease

## 2014-07-04 ENCOUNTER — Encounter: Payer: Self-pay | Admitting: Cardiovascular Disease

## 2014-07-04 ENCOUNTER — Ambulatory Visit (INDEPENDENT_AMBULATORY_CARE_PROVIDER_SITE_OTHER): Payer: Medicare Other | Admitting: Cardiovascular Disease

## 2014-07-04 VITALS — BP 172/84 | HR 84 | Ht 67.0 in | Wt 187.0 lb

## 2014-07-04 DIAGNOSIS — R55 Syncope and collapse: Secondary | ICD-10-CM

## 2014-07-04 DIAGNOSIS — E7800 Pure hypercholesterolemia, unspecified: Secondary | ICD-10-CM

## 2014-07-04 DIAGNOSIS — R9431 Abnormal electrocardiogram [ECG] [EKG]: Secondary | ICD-10-CM

## 2014-07-04 DIAGNOSIS — I10 Essential (primary) hypertension: Secondary | ICD-10-CM

## 2014-07-04 NOTE — Patient Instructions (Signed)
  We will see you back in follow up in 6 months with Dr Berry  Dr Berry has ordered : 1.  Event monitor. Event monitors are medical devices that record the heart's electrical activity. Doctors most often us these monitors to diagnose arrhythmias. Arrhythmias are problems with the speed or rhythm of the heartbeat. The monitor is a small, portable device. You can wear one while you do your normal daily activities. This is usually used to diagnose what is causing palpitations/syncope (passing out).  2.  Echocardiogram. Echocardiography is a painless test that uses sound waves to create images of your heart. It provides your doctor with information about the size and shape of your heart and how well your heart's chambers and valves are working. This procedure takes approximately one hour. There are no restrictions for this procedure.      

## 2014-07-04 NOTE — Assessment & Plan Note (Signed)
Not on statin therapy. 

## 2014-07-04 NOTE — Assessment & Plan Note (Signed)
Evidence of left ventricular hypertrophy

## 2014-07-04 NOTE — Assessment & Plan Note (Signed)
The patient was referred for evaluation of syncope. She was remotely a patient of mine saw approximately 8  years ago. She's opacified episodes of syncope over the years but is on 2 months ago. These occur while she is sitting down and are preceded by a discomfort in her abdomen. There is no lot loss of bowel or bladder control.I am going to get a 2-D echo and a one-month event monitor I will see her back after that.

## 2014-07-04 NOTE — Progress Notes (Signed)
07/04/2014 Hannah Lamb   02-01-1941  [id number]  Primary Physician Lonia BloodGARBA,LAWAL, MD Primary Cardiologist: Runell GessJonathan J. Nolyn Swab MD Roseanne RenoFACP,FACC,FAHA, FSCAI   HPI:  Ms. Hannah Lamb is a 73 year old mildly overweight widowed African American female mother of 6 children (accompanied by one of her daughters Hannah Lamb( Hannah Lamb),  grandmother to [phone] grandchildren he was referred for evaluation of syncope. She is a patient of mine who I last saw him 3 years ago. She was referred by her primary care physician Dr. Lillie Lamb. Her history is notable for 2 hypertension and diabetes. She's never had a heart or stroke. She denies chest pain or shortness of breath. She's had multiple sclerosis diagnosable glass every years and is wheelchair bound. She's had 5 episodes of syncope the most recent one being 2 months ago. These usually occur while she is in a seated position and are preceded by a discomfort in her abdomen suggesting that these are vagal in nature.   Current Outpatient Prescriptions  Medication Sig Dispense Refill  . amLODipine (NORVASC) 10 MG tablet Take 1 tablet (10 mg total) by mouth daily.  30 tablet  0  . aspirin EC 325 MG tablet Take 325 mg by mouth daily.      . B-D INS SYRINGE 0.5CC/30GX1/2" 30G X 1/2" 0.5 ML MISC       . HUMULIN 70/30 (70-30) 100 UNIT/ML injection Inject 15 Units into the skin daily.      . irbesartan (AVAPRO) 300 MG tablet Take 1 tablet (300 mg total) by mouth daily.  30 tablet  0   No current facility-administered medications for this visit.    Allergies  Allergen Reactions  . Sulfa Drugs Cross Reactors Other (See Comments)    dizziness  . Lipitor [Atorvastatin] Other (See Comments)    Muscle weakness    History   Social History  . Marital Status: Widowed    Spouse Name: N/A    Number of Children: N/A  . Years of Education: N/A   Occupational History  . Not on file.   Social History Main Topics  . Smoking status: Never Smoker   . Smokeless tobacco: Not on file  .  Alcohol Use: No  . Drug Use: No  . Sexual Activity: No   Other Topics Concern  . Not on file   Social History Narrative   Lives with daughter.     Review of Systems: General: negative for chills, fever, night sweats or weight changes.  Cardiovascular: negative for chest pain, dyspnea on exertion, edema, orthopnea, palpitations, paroxysmal nocturnal dyspnea or shortness of breath Dermatological: negative for rash Respiratory: negative for cough or wheezing Urologic: negative for hematuria Abdominal: negative for nausea, vomiting, diarrhea, bright red blood per rectum, melena, or hematemesis Neurologic: negative for visual changes, syncope, or dizziness All other systems reviewed and are otherwise negative except as noted above.    Blood pressure 172/84, pulse 84, height 5\' 7"  (1.702 m), weight 187 lb (84.823 kg).  General appearance: alert and no distress Neck: no adenopathy, no carotid bruit, no JVD, supple, symmetrical, trachea midline and thyroid not enlarged, symmetric, no tenderness/mass/nodules Lungs: clear to auscultation bilaterally Heart: regular rate and rhythm, S1, S2 normal, no murmur, click, rub or gallop Extremities: extremities normal, atraumatic, no cyanosis or edema  EKG normal sinus rhythm at 84 with evidence of left ventricular hypertrophy with repolarization changes  ASSESSMENT AND PLAN:   HYPERTENSION, BENIGN SYSTEMIC Her blood pressure is elevated today that she did not take her  blood pressure medicines this morning  Abnormal EKG Evidence of left ventricular hypertrophy  HYPERCHOLESTEROLEMIA Not on statin therapy  Syncope The patient was referred for evaluation of syncope. She was remotely a patient of mine saw approximately 8  years ago. She's opacified episodes of syncope over the years but is on 2 months ago. These occur while she is sitting down and are preceded by a discomfort in her abdomen. There is no lot loss of bowel or bladder control.I am  going to get a 2-D echo and a one-month event monitor I will see her back after that.      Runell Gess MD FACP,FACC,FAHA, [facility] 07/04/2014 4:30 PM

## 2014-07-04 NOTE — Assessment & Plan Note (Signed)
Her blood pressure is elevated today that she did not take her blood pressure medicines this morning

## 2014-07-09 ENCOUNTER — Telehealth (HOSPITAL_COMMUNITY): Payer: Self-pay | Admitting: *Deleted

## 2014-07-14 ENCOUNTER — Telehealth: Payer: Self-pay | Admitting: Cardiovascular Disease

## 2014-07-14 NOTE — Telephone Encounter (Signed)
[name] advised to call toll free number given to her with [employer] monitor for assistance with battery problem.

## 2014-07-14 NOTE — Telephone Encounter (Signed)
The pt's daughter called in stating that Hannah Lamb received a heart monitor to wear for 30 days on 10/23 and the daughter states that the battery dies every 2 days. She would like to know what to do. Please call  Thanks

## 2014-07-21 ENCOUNTER — Telehealth (HOSPITAL_COMMUNITY): Payer: Self-pay | Admitting: *Deleted

## 2014-07-25 ENCOUNTER — Telehealth (HOSPITAL_COMMUNITY): Payer: Self-pay | Admitting: *Deleted

## 2014-08-26 ENCOUNTER — Encounter (HOSPITAL_COMMUNITY): Payer: Self-pay | Admitting: Emergency Medicine

## 2014-08-26 ENCOUNTER — Emergency Department (HOSPITAL_COMMUNITY)
Admission: EM | Admit: 2014-08-26 | Discharge: 2014-08-27 | Disposition: A | Discharge status: Home / Self Care | Payer: Medicare Other | Attending: Emergency Medicine | Admitting: Emergency Medicine

## 2014-08-26 ENCOUNTER — Emergency Department (HOSPITAL_COMMUNITY): Payer: Medicare Other

## 2014-08-26 DIAGNOSIS — Z8659 Personal history of other mental and behavioral disorders: Secondary | ICD-10-CM | POA: Diagnosis not present

## 2014-08-26 DIAGNOSIS — G8929 Other chronic pain: Secondary | ICD-10-CM

## 2014-08-26 DIAGNOSIS — M25562 Pain in left knee: Secondary | ICD-10-CM

## 2014-08-26 DIAGNOSIS — R55 Syncope and collapse: Secondary | ICD-10-CM | POA: Diagnosis present

## 2014-08-26 DIAGNOSIS — I252 Old myocardial infarction: Secondary | ICD-10-CM | POA: Insufficient documentation

## 2014-08-26 DIAGNOSIS — I10 Essential (primary) hypertension: Secondary | ICD-10-CM | POA: Insufficient documentation

## 2014-08-26 DIAGNOSIS — M179 Osteoarthritis of knee, unspecified: Secondary | ICD-10-CM | POA: Diagnosis not present

## 2014-08-26 DIAGNOSIS — Z79899 Other long term (current) drug therapy: Secondary | ICD-10-CM | POA: Insufficient documentation

## 2014-08-26 DIAGNOSIS — Z8673 Personal history of transient ischemic attack (TIA), and cerebral infarction without residual deficits: Secondary | ICD-10-CM | POA: Insufficient documentation

## 2014-08-26 DIAGNOSIS — R52 Pain, unspecified: Secondary | ICD-10-CM

## 2014-08-26 DIAGNOSIS — E049 Nontoxic goiter, unspecified: Secondary | ICD-10-CM | POA: Diagnosis not present

## 2014-08-26 DIAGNOSIS — M25561 Pain in right knee: Secondary | ICD-10-CM | POA: Insufficient documentation

## 2014-08-26 DIAGNOSIS — M1712 Unilateral primary osteoarthritis, left knee: Secondary | ICD-10-CM

## 2014-08-26 DIAGNOSIS — E119 Type 2 diabetes mellitus without complications: Secondary | ICD-10-CM | POA: Diagnosis not present

## 2014-08-26 DIAGNOSIS — R931 Abnormal findings on diagnostic imaging of heart and coronary circulation: Secondary | ICD-10-CM | POA: Diagnosis not present

## 2014-08-26 DIAGNOSIS — R9389 Abnormal findings on diagnostic imaging of other specified body structures: Secondary | ICD-10-CM

## 2014-08-26 HISTORY — DX: Pain in left knee: M25.562

## 2014-08-26 HISTORY — DX: Other chronic pain: G89.29

## 2014-08-26 HISTORY — DX: Pain in right knee: M25.561

## 2014-08-26 LAB — BASIC METABOLIC PANEL
Anion gap: 13 (ref 5–15)
BUN: 14 mg/dL (ref 6–23)
CO2: 27 mEq/L (ref 19–32)
Calcium: 9.6 mg/dL (ref 8.4–10.5)
Chloride: 102 mEq/L (ref 96–112)
Creatinine, Ser: 0.56 mg/dL (ref 0.50–1.10)
GFR calc Af Amer: >90 mL/min (ref >90)
GFR, EST NON AFRICAN AMERICAN: 90 mL/min — AB (ref >90)
Glucose, Bld: 236 mg/dL — ABNORMAL HIGH (ref 70–99)
POTASSIUM: 3.3 meq/L — AB (ref 3.7–5.3)
Sodium: 142 mEq/L (ref 137–147)

## 2014-08-26 LAB — CBC WITH DIFFERENTIAL/PLATELET
Basophils Absolute: 0 10*3/uL (ref 0.0–0.1)
Basophils Relative: 1 % (ref 0–1)
EOS PCT: 2 % (ref 0–5)
Eosinophils Absolute: 0.1 10*3/uL (ref 0.0–0.7)
HEMATOCRIT: 38.9 % (ref 36.0–46.0)
Hemoglobin: 13.1 g/dL (ref 12.0–15.0)
LYMPHS ABS: 1.6 10*3/uL (ref 0.7–4.0)
Lymphocytes Relative: 26 % (ref 12–46)
MCH: 27.9 pg (ref 26.0–34.0)
MCHC: 33.7 g/dL (ref 30.0–36.0)
MCV: 82.8 fL (ref 78.0–100.0)
MONOS PCT: 5 % (ref 3–12)
Monocytes Absolute: 0.3 10*3/uL (ref 0.1–1.0)
Neutro Abs: 4.2 10*3/uL (ref 1.7–7.7)
Neutrophils Relative %: 66 % (ref 43–77)
Platelets: 192 10*3/uL (ref 150–400)
RBC: 4.7 MIL/uL (ref 3.87–5.11)
RDW: 14.4 % (ref 11.5–15.5)
WBC: 6.3 10*3/uL (ref 4.0–10.5)

## 2014-08-26 LAB — TROPONIN I: Troponin I: <0.3 ng/mL (ref <0.30)

## 2014-08-26 NOTE — ED Notes (Signed)
Per EMS & family at bedside- pt was sitting in church and had a brief syncopal episode. Family layed pt down and she came to. History of this happening frequently over the past year.  Alert and oriented. Pt did not hit head. Last syncopal episode was two months ago.

## 2014-08-27 ENCOUNTER — Emergency Department (HOSPITAL_COMMUNITY): Payer: Medicare Other

## 2014-08-27 ENCOUNTER — Encounter (HOSPITAL_COMMUNITY): Payer: Self-pay

## 2014-08-27 DIAGNOSIS — R55 Syncope and collapse: Secondary | ICD-10-CM | POA: Diagnosis not present

## 2014-08-27 LAB — URINALYSIS, ROUTINE W REFLEX MICROSCOPIC
Bilirubin Urine: NEGATIVE
GLUCOSE, UA: 100 mg/dL — AB
Hgb urine dipstick: NEGATIVE
Ketones, ur: NEGATIVE mg/dL
Nitrite: NEGATIVE
PH: 7.5 (ref 5.0–8.0)
PROTEIN: NEGATIVE mg/dL
SPECIFIC GRAVITY, URINE: 1.039 — AB (ref 1.005–1.030)
Urobilinogen, UA: 0.2 mg/dL (ref 0.0–1.0)

## 2014-08-27 LAB — URINE MICROSCOPIC-ADD ON

## 2014-08-27 MED ORDER — POTASSIUM CHLORIDE CRYS ER 20 MEQ PO TBCR
20.0000 meq | EXTENDED_RELEASE_TABLET | Freq: Once | ORAL | Status: AC
  Administered 2014-08-27: 20 meq via ORAL
  Filled 2014-08-27: qty 1

## 2014-08-27 MED ORDER — IOHEXOL 350 MG/ML SOLN
100.0000 mL | Freq: Once | INTRAVENOUS | Status: AC | PRN
  Administered 2014-08-27: 100 mL via INTRAVENOUS

## 2014-08-27 MED ORDER — TRAMADOL HCL 50 MG PO TABS: 50.0000 mg | ORAL_TABLET | Freq: Four times a day (QID) | ORAL | Status: DC | PRN

## 2014-08-27 NOTE — Discharge Instructions (Signed)
It was our pleasure to provide your ER care today - we hope that you feel better.  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  Take tylenol/advil as need. You may also take ultram as need for pain - no driving when taking.  For knee pain, follow up with orthopedist in the next few weeks - see referral - call office to arrange appointment.  Incidental note was made on your other imaging studies of large thyroid mass/goiter, which appeared relatively unchanged from a couple years ago - discuss with your doctor at follow up with him.  For fainting episode(s), follow up with your cardiologist in coming week - call office to arrange appointment.  From today's lab results, your potassium level is mildly low (3.3) - eat plenty of fruits and vegetables, and follow up with your doctor.  Return to ER if worse, new symptoms, chest pain, trouble breathing, fevers, recurrent fainting episodes, other concern.      Syncope Syncope is a medical term for fainting or passing out. This means you lose consciousness and drop to the ground. People are generally unconscious for less than 5 minutes. You may have some muscle twitches for up to 15 seconds before waking up and returning to normal. Syncope occurs more often in older adults, but it can happen to anyone. While most causes of syncope are not dangerous, syncope can be a sign of a serious medical problem. It is important to seek medical care.  CAUSES  Syncope is caused by a sudden drop in blood flow to the brain. The specific cause is often not determined. Factors that can bring on syncope include:  Taking medicines that lower blood pressure.  Sudden changes in posture, such as standing up quickly.  Taking more medicine than prescribed.  Standing in one place for too long.  Seizure disorders.  Dehydration and excessive exposure to heat.  Low blood sugar (hypoglycemia).  Straining to have a bowel movement.  Heart disease,  irregular heartbeat, or other circulatory problems.  Fear, emotional distress, seeing blood, or severe pain. SYMPTOMS  Right before fainting, you may:  Feel dizzy or light-headed.  Feel nauseous.  See all white or all black in your field of vision.  Have cold, clammy skin. DIAGNOSIS  Your health care provider will ask about your symptoms, perform a physical exam, and perform an electrocardiogram (ECG) to record the electrical activity of your heart. Your health care provider may also perform other heart or blood tests to determine the cause of your syncope which may include:  Transthoracic echocardiogram (TTE). During echocardiography, sound waves are used to evaluate how blood flows through your heart.  Transesophageal echocardiogram (TEE).  Cardiac monitoring. This allows your health care provider to monitor your heart rate and rhythm in real time.  Holter monitor. This is a portable device that records your heartbeat and can help diagnose heart arrhythmias. It allows your health care provider to track your heart activity for several days, if needed.  Stress tests by exercise or by giving medicine that makes the heart beat faster. TREATMENT  In most cases, no treatment is needed. Depending on the cause of your syncope, your health care provider may recommend changing or stopping some of your medicines. HOME CARE INSTRUCTIONS  Have someone stay with you until you feel stable.  Do not drive, use machinery, or play sports until your health care provider says it is okay.  Keep all follow-up appointments as directed by your health care provider.  Lie down right away if you start feeling like you might faint. Breathe deeply and steadily. Wait until all the symptoms have passed.  Drink enough fluids to keep your urine clear or pale yellow.  If you are taking blood pressure or heart medicine, get up slowly and take several minutes to sit and then stand. This can reduce  dizziness. SEEK IMMEDIATE MEDICAL CARE IF:   You have a severe headache.  You have unusual pain in the chest, abdomen, or back.  You are bleeding from your mouth or rectum, or you have black or tarry stool.  You have an irregular or very fast heartbeat.  You have pain with breathing.  You have repeated fainting or seizure-like jerking during an episode.  You faint when sitting or lying down.  You have confusion.  You have trouble walking.  You have severe weakness.  You have vision problems. If you fainted, call your local emergency services (911 in [place] Do not drive yourself to the hospital.  MAKE SURE YOU:  Understand these instructions.  Will watch your condition.  Will get help right away if you are not doing well or get worse. Document Released: 08/29/2005 Document Revised: 09/03/2013 Document Reviewed: 10/28/2011 [facility] Patient Information 2015 [place], [place]. This information is not intended to replace advice given to you by your health care provider. Make sure you discuss any questions you have with your health care provider.     Osteoarthritis Osteoarthritis is a disease that causes soreness and inflammation of a joint. It occurs when the cartilage at the affected joint wears down. Cartilage acts as a cushion, covering the ends of bones where they meet to form a joint. Osteoarthritis is the most common form of arthritis. It often occurs in older people. The joints affected most often by this condition include those in the:  Ends of the fingers.  Thumbs.  Neck.  Lower back.  Knees.  Hips. CAUSES  Over time, the cartilage that covers the ends of bones begins to wear away. This causes bone to rub on bone, producing pain and stiffness in the affected joints.  RISK FACTORS Certain factors can increase your chances of having osteoarthritis, including:  Older age.  Excessive body weight.  Overuse of joints.  Previous joint injury. SIGNS AND  SYMPTOMS   Pain, swelling, and stiffness in the joint.  Over time, the joint may lose its normal shape.  Small deposits of bone (osteophytes) may grow on the edges of the joint.  Bits of bone or cartilage can break off and float inside the joint space. This may cause more pain and damage. DIAGNOSIS  Your health care provider will do a physical exam and ask about your symptoms. Various tests may be ordered, such as:  X-rays of the affected joint.  An MRI scan.  Blood tests to rule out other types of arthritis.  Joint fluid tests. This involves using a needle to draw fluid from the joint and examining the fluid under a microscope. TREATMENT  Goals of treatment are to control pain and improve joint function. Treatment plans may include:  A prescribed exercise program that allows for rest and joint relief.  A weight control plan.  Pain relief techniques, such as:  Properly applied heat and cold.  Electric pulses delivered to nerve endings under the skin (transcutaneous electrical nerve stimulation [TENS]).  Massage.  Certain nutritional supplements.  Medicines to control pain, such as:  Acetaminophen.  Nonsteroidal anti-inflammatory drugs (NSAIDs), such as naproxen.  Narcotic  or central-acting agents, such as tramadol.  Corticosteroids. These can be given orally or as an injection.  Surgery to reposition the bones and relieve pain (osteotomy) or to remove loose pieces of bone and cartilage. Joint replacement may be needed in advanced states of osteoarthritis. HOME CARE INSTRUCTIONS   Take medicines only as directed by your health care provider.  Maintain a healthy weight. Follow your health care provider's instructions for weight control. This may include dietary instructions.  Exercise as directed. Your health care provider can recommend specific types of exercise. These may include:  Strengthening exercises. These are done to strengthen the muscles that support  joints affected by arthritis. They can be performed with weights or with exercise bands to add resistance.  Aerobic activities. These are exercises, such as brisk walking or low-impact aerobics, that get your heart pumping.  Range-of-motion activities. These keep your joints limber.  Balance and agility exercises. These help you maintain daily living skills.  Rest your affected joints as directed by your health care provider.  Keep all follow-up visits as directed by your health care provider. SEEK MEDICAL CARE IF:   Your skin turns red.  You develop a rash in addition to your joint pain.  You have worsening joint pain.  You have a fever along with joint or muscle aches. SEEK IMMEDIATE MEDICAL CARE IF:  You have a significant loss of weight or appetite.  You have night sweats. FOR MORE INFORMATION   National Institute of Arthritis and Musculoskeletal and Skin Diseases: www.niams.http://www.myers.net/  [employer] on Aging: https://walker.com/  American College of Rheumatology: www.rheumatology.org Document Released: 08/29/2005 Document Revised: 01/13/2014 Document Reviewed: 05/06/2013 [facility] Patient Information 2015 [place], [place]. This information is not intended to replace advice given to you by your health care provider. Make sure you discuss any questions you have with your health care provider.    Knee Pain The knee is the complex joint between your thigh and your lower leg. It is made up of bones, tendons, ligaments, and cartilage. The bones that make up the knee are:  The femur in the thigh.  The tibia and fibula in the lower leg.  The patella or kneecap riding in the groove on the lower femur. CAUSES  Knee pain is a common complaint with many causes. A few of these causes are:  Injury, such as:  A ruptured ligament or tendon injury.  Torn cartilage.  Medical conditions, such as:  Gout  Arthritis  Infections  Overuse, over training, or overdoing a physical  activity. Knee pain can be minor or severe. Knee pain can accompany debilitating injury. Minor knee problems often respond well to self-care measures or get well on their own. More serious injuries may need medical intervention or even surgery. SYMPTOMS The knee is complex. Symptoms of knee problems can vary widely. Some of the problems are:  Pain with movement and weight bearing.  Swelling and tenderness.  Buckling of the knee.  Inability to straighten or extend your knee.  Your knee locks and you cannot straighten it.  Warmth and redness with pain and fever.  Deformity or dislocation of the kneecap. DIAGNOSIS  Determining what is wrong may be very straight forward such as when there is an injury. It can also be challenging because of the complexity of the knee. Tests to make a diagnosis may include:  Your caregiver taking a history and doing a physical exam.  Routine X-rays can be used to rule out other problems. X-rays will not reveal  a cartilage tear. Some injuries of the knee can be diagnosed by:  Arthroscopy a surgical technique by which a small video camera is inserted through tiny incisions on the sides of the knee. This procedure is used to examine and repair internal knee joint problems. Tiny instruments can be used during arthroscopy to repair the torn knee cartilage (meniscus).  Arthrography is a radiology technique. A contrast liquid is directly injected into the knee joint. Internal structures of the knee joint then become visible on X-ray film.  An MRI scan is a non X-ray radiology procedure in which magnetic fields and a computer produce two- or three-dimensional images of the inside of the knee. Cartilage tears are often visible using an MRI scanner. MRI scans have largely replaced arthrography in diagnosing cartilage tears of the knee.  Blood work.  Examination of the fluid that helps to lubricate the knee joint (synovial fluid). This is done by taking a sample out  using a needle and a syringe. TREATMENT The treatment of knee problems depends on the cause. Some of these treatments are:  Depending on the injury, proper casting, splinting, surgery, or physical therapy care will be needed.  Give yourself adequate recovery time. Do not overuse your joints. If you begin to get sore during workout routines, back off. Slow down or do fewer repetitions.  For repetitive activities such as cycling or running, maintain your strength and nutrition.  Alternate muscle groups. For example, if you are a weight lifter, work the upper body on one day and the lower body the next.  Either tight or weak muscles do not give the proper support for your knee. Tight or weak muscles do not absorb the stress placed on the knee joint. Keep the muscles surrounding the knee strong.  Take care of mechanical problems.  If you have flat feet, orthotics or special shoes may help. See your caregiver if you need help.  Arch supports, sometimes with wedges on the inner or outer aspect of the heel, can help. These can shift pressure away from the side of the knee most bothered by osteoarthritis.  A brace called an "unloader" brace also may be used to help ease the pressure on the most arthritic side of the knee.  If your caregiver has prescribed crutches, braces, wraps or ice, use as directed. The acronym for this is PRICE. This means protection, rest, ice, compression, and elevation.  Nonsteroidal anti-inflammatory drugs (NSAIDs), can help relieve pain. But if taken immediately after an injury, they may actually increase swelling. Take NSAIDs with food in your stomach. Stop them if you develop stomach problems. Do not take these if you have a history of ulcers, stomach pain, or bleeding from the bowel. Do not take without your caregiver's approval if you have problems with fluid retention, heart failure, or kidney problems.  For ongoing knee problems, physical therapy may be  helpful.  Glucosamine and chondroitin are over-the-counter dietary supplements. Both may help relieve the pain of osteoarthritis in the knee. These medicines are different from the usual anti-inflammatory drugs. Glucosamine may decrease the rate of cartilage destruction.  Injections of a corticosteroid drug into your knee joint may help reduce the symptoms of an arthritis flare-up. They may provide pain relief that lasts a few months. You may have to wait a few months between injections. The injections do have a small increased risk of infection, water retention, and elevated blood sugar levels.  Hyaluronic acid injected into damaged joints may ease pain and provide  lubrication. These injections may work by reducing inflammation. A series of shots may give relief for as long as 6 months.  Topical painkillers. Applying certain ointments to your skin may help relieve the pain and stiffness of osteoarthritis. Ask your pharmacist for suggestions. Many over the-counter products are approved for temporary relief of arthritis pain.  In some countries, doctors often prescribe topical NSAIDs for relief of chronic conditions such as arthritis and tendinitis. A review of treatment with NSAID creams found that they worked as well as oral medications but without the serious side effects. PREVENTION  Maintain a healthy weight. Extra pounds put more strain on your joints.  Get strong, stay limber. Weak muscles are a common cause of knee injuries. Stretching is important. Include flexibility exercises in your workouts.  Be smart about exercise. If you have osteoarthritis, chronic knee pain or recurring injuries, you may need to change the way you exercise. This does not mean you have to stop being active. If your knees ache after jogging or playing basketball, consider switching to swimming, water aerobics, or other low-impact activities, at least for a few days a week. Sometimes limiting high-impact activities  will provide relief.  Make sure your shoes fit well. Choose footwear that is right for your sport.  Protect your knees. Use the proper gear for knee-sensitive activities. Use kneepads when playing volleyball or laying carpet. Buckle your seat belt every time you drive. Most shattered kneecaps occur in car accidents.  Rest when you are tired. SEEK MEDICAL CARE IF:  You have knee pain that is continual and does not seem to be getting better.  SEEK IMMEDIATE MEDICAL CARE IF:  Your knee joint feels hot to the touch and you have a high fever. MAKE SURE YOU:   Understand these instructions.  Will watch your condition.  Will get help right away if you are not doing well or get worse. Document Released: 06/26/2007 Document Revised: 11/21/2011 Document Reviewed: 06/26/2007 [facility] Patient Information 2015 [place], [place]. This information is not intended to replace advice given to you by your health care provider. Make sure you discuss any questions you have with your health care provider.    Hypokalemia Hypokalemia means that the amount of potassium in the blood is lower than normal.Potassium is a chemical, called an electrolyte, that helps regulate the amount of fluid in the body. It also stimulates muscle contraction and helps nerves function properly.Most of the body's potassium is inside of cells, and only a very small amount is in the blood. Because the amount in the blood is so small, minor changes can be life-threatening. CAUSES  Antibiotics.  Diarrhea or vomiting.  Using laxatives too much, which can cause diarrhea.  Chronic kidney disease.  Water pills (diuretics).  Eating disorders (bulimia).  Low magnesium level.  Sweating a lot. SIGNS AND SYMPTOMS  Weakness.  Constipation.  Fatigue.  Muscle cramps.  Mental confusion.  Skipped heartbeats or irregular heartbeat (palpitations).  Tingling or numbness. DIAGNOSIS  Your health care provider can diagnose  hypokalemia with blood tests. In addition to checking your potassium level, your health care provider may also check other lab tests. TREATMENT Hypokalemia can be treated with potassium supplements taken by mouth or adjustments in your current medicines. If your potassium level is very low, you may need to get potassium through a vein (IV) and be monitored in the hospital. A diet high in potassium is also helpful. Foods high in potassium are:  Nuts, such as peanuts and pistachios.  Seeds, such as sunflower seeds and pumpkin seeds.  Peas, lentils, and lima beans.  Whole grain and bran cereals and breads.  Fresh fruit and vegetables, such as apricots, avocado, bananas, cantaloupe, kiwi, oranges, tomatoes, asparagus, and potatoes.  Orange and tomato juices.  Red meats.  Fruit yogurt. HOME CARE INSTRUCTIONS  Take all medicines as prescribed by your health care provider.  Maintain a healthy diet by including nutritious food, such as fruits, vegetables, nuts, whole grains, and lean meats.  If you are taking a laxative, be sure to follow the directions on the label. SEEK MEDICAL CARE IF:  Your weakness gets worse.  You feel your heart pounding or racing.  You are vomiting or having diarrhea.  You are diabetic and having trouble keeping your blood glucose in the normal range. SEEK IMMEDIATE MEDICAL CARE IF:  You have chest pain, shortness of breath, or dizziness.  You are vomiting or having diarrhea for more than 2 days.  You faint. MAKE SURE YOU:   Understand these instructions.  Will watch your condition.  Will get help right away if you are not doing well or get worse. Document Released: 08/29/2005 Document Revised: 06/19/2013 Document Reviewed: 03/01/2013 [facility] Patient Information 2015 [place], [place]. This information is not intended to replace advice given to you by your health care provider. Make sure you discuss any questions you have with your health care  provider.    Goiter Goiter is an enlarged thyroid gland. The thyroid gland sits at the base of the front of the neck. The gland produces hormones that regulate mood, body temperature, pulse rate, and digestion. Most goiters are painless and are not a cause for serious concern. Goiters and conditions that cause goiters can be treated if necessary.  CAUSES  Common causes of goiter include:  Graves disease (causes too much hormone to be produced [hyperthyroidism]).  Hashimoto disease (causes too little hormone to be produced [hypothyroidism]).  Thyroiditis (inflammation of the thyroid sometimes caused by virus or pregnancy).  Nodular goiter (small bumps form; sometimes called toxic nodular goiter).  Pregnancy.  Thyroid cancer (very few goiters with nodules are cancerous).  Certain medications.  Radiation exposure.  Iodine deficiency (more common in developing countries in inland populations). RISK FACTORS Risk factors for goiter include:  A family history of goiter.  Female gender.  Inadequate iodine in the diet.  Age older than 40 years. SYMPTOMS  Many goiters do not cause symptoms. When symptoms do occur, they may include:  Swelling in the lower part of the neck. This swelling can range from a very small bump to a large lump.  A tight feeling in the throat.  A hoarse voice. Less commonly, a goiter may result in:  Coughing.  Wheezing.  Difficulty swallowing.  Difficulty breathing.  Bulging neck veins.  Dizziness. When a goiter is the result of hyperthyroidism, symptoms may include:  Rapid or irregular heartbeat.  Sickness in your stomach (nausea).  Vomiting.  Diarrhea.  Shaking.  Irritable feeling.  Bulging eyes.  Weight loss.  Heat sensitivity.  Anxiety. When a goiter is the result of hypothyroidism, symptoms may include:  Tiredness.  Dry skin.  Constipation.  Weight gain.  Irregular menstrual cycle.  Depressed  mood.  Sensitivity to cold. DIAGNOSIS  Tests used to diagnose goiter include:  A physical exam.  Blood tests, including thyroid hormone levels and antibody testing.  Ultrasonography, computerized X-ray scan (computed tomography, CT) or computerized magnetic scan (magnetic resonance imaging, MRI).  Thyroid scan (imaging along with  safe radioactive injection).  Tissue sample taken (biopsy) of nodules. This is sometimes done to confirm that the nodules are not cancerous. TREATMENT  Treatment will depend on the cause of the goiter. Treatment may include:  Monitoring. In some cases, no treatment is necessary, and your doctor will monitor your condition at regular checkups.  Medications and supplements. Thyroid medication (thyroid hormone replacement) is available for hyperthyroidism and hypothyroidism.  If inflammation is the cause, over-the-counter medication or steroid medication may be recommended.  Goiters caused by iodine deficiency can be treated with iodine supplements or changes in diet.  Radioactive iodine treatment. Radioactive iodine is injected into the blood. It travels to the thyroid gland, kills thyroid cells, and reduces the size of the gland. This is only used when the thyroid gland is overactive. Lifelong thyroid hormone medication is often necessary after this treatment.  Surgery. A procedure to remove all or part of the gland may be recommended in severe cases or when cancer is the cause. Hormones can be taken to replace the hormones normally produced by the thyroid. HOME CARE INSTRUCTIONS   Take medications as directed.  Follow your caregiver's recommendations for any dietary changes.  Follow up with your caregiver for further examination and testing, as directed. PREVENTION   If you have a family history of goiter, discuss screening with your doctor.  Make sure you are getting enough iodine in your diet.  Use of iodized table salt can help prevent iodine  deficiency. Document Released: 02/16/2010 Document Revised: 01/13/2014 Document Reviewed: 02/16/2010 [facility] Patient Information 2015 [place], [place]. This information is not intended to replace advice given to you by your health care provider. Make sure you discuss any questions you have with your health care provider.

## 2014-08-27 NOTE — ED Notes (Signed)
Pt a/o x 4 on d/c, with PTAR.

## 2014-08-27 NOTE — ED Provider Notes (Addendum)
CSN: [id number]     Arrival date & time 08/26/14  2131 History   First MD Initiated Contact with Patient 08/26/14 2149     Chief Complaint  Patient presents with  . Loss of Consciousness      HPI  Pt was seen at 2210. Per pt and her family, c/o sudden onset and resolution of one episode of brief syncope that occurred while sitting in church P[employer]. Pt laid pt down on the floor and she "came to" and returned to her baseline mental status. Pt has significant hx of recurrent syncope, and has been evaluated extensively by her PMD and Cards MD (30 day Holter monitor) without definitive dx. Pt denies any change in her usual syncope today. Pt's only complaint currently is acute flair of her chronic bilat knees "pain." Denies any change in her usual pain pattern. Denies injury, no rash, no fevers, no CP/palpitations, no SOB/cough, no abd pain, no N/V/D, no seizure activity, no incont of bowel or bladder, no apnea or pulselessness.   Past Medical History  Diagnosis Date  . Hypertension   . MS (multiple sclerosis)   . Diabetes mellitus   . Goiter   . Hyperlipidemia   . Myocardial infarction   . Anxiety   . Stroke   . Syncope     recurrent  . Bilateral chronic knee pain    Past Surgical History  Procedure Laterality Date  . Abdominal hysterectomy     Family History  Problem Relation Age of Onset  . Diabetes Father    History  Substance Use Topics  . Smoking status: Never Smoker   . Smokeless tobacco: Not on file  . Alcohol Use: No   OB History    No data available     Review of Systems ROS: Statement: All systems negative except as marked or noted in the HPI; Constitutional: Negative for fever and chills. ; ; Eyes: Negative for eye pain, redness and discharge. ; ; ENMT: Negative for ear pain, hoarseness, nasal congestion, sinus pressure and sore throat. ; ; Cardiovascular: Negative for chest pain, palpitations, diaphoresis, dyspnea and peripheral edema. ; ; Respiratory: Negative for  cough, wheezing and stridor. ; ; Gastrointestinal: Negative for nausea, vomiting, diarrhea, abdominal pain, blood in stool, hematemesis, jaundice and rectal bleeding. . ; ; Genitourinary: Negative for dysuria, flank pain and hematuria. ; ; Musculoskeletal: +chronic knees pain. Negative for back pain and neck pain. Negative for swelling and trauma.; ; Skin: Negative for pruritus, rash, abrasions, blisters, bruising and skin lesion.; ; Neuro: Negative for headache, lightheadedness and neck stiffness. Negative for weakness, altered level of consciousness , altered mental status, extremity weakness, paresthesias, involuntary movement, seizure and +syncope.      Allergies  Sulfa drugs cross reactors and Lipitor  Home Medications   Prior to Admission medications   Medication Sig Start Date End Date Taking? Authorizing Provider  amLODipine (NORVASC) 10 MG tablet Take 1 tablet (10 mg total) by mouth daily. 01/08/13  Yes Laveda Normanhris N Oti, MD  HUMULIN 70/30 (70-30) 100 UNIT/ML injection Inject 15 Units into the skin daily. 03/17/14  Yes Historical Provider, MD  irbesartan (AVAPRO) 300 MG tablet Take 1 tablet (300 mg total) by mouth daily. 01/08/13  Yes Laveda Normanhris N Oti, MD   BP 173/76 mmHg  Pulse 64  Temp(Src) 98.1 F (36.7 C) (Oral)  Resp 15  Ht 5' 7.5" (1.715 m)  Wt 187 lb (84.823 kg)  BMI 28.84 kg/m2  SpO2 100% Physical Exam  2215: Physical  examination:  Nursing notes reviewed; Vital signs and O2 SAT reviewed;  Constitutional: Well developed, Well nourished, Well hydrated, In no acute distress; Head:  Normocephalic, atraumatic; Eyes: EOMI, PERRL, No scleral icterus; ENMT: TM's clear bilat. Mouth and pharynx normal, Mucous membranes moist; Neck: Supple, Full range of motion, No lymphadenopathy; Cardiovascular: Regular rate and rhythm, No gallop; Respiratory: Breath sounds clear & equal bilaterally, No rales, rhonchi, wheezes.  Speaking full sentences with ease, Normal respiratory effort/excursion; Chest:  Nontender, Movement normal; Abdomen: Soft, Nontender, Nondistended, Normal bowel sounds; Genitourinary: No CVA tenderness; Extremities: Pulses normal, No deformity. Pelvis stable. NT bilat hips/ankles/feet. No ligamentous laxity bilat knees. No patellar or quad tendon step-offs. Strong pedal pp bilat. No proximal fibular head tenderness.  No edema, erythema, warmth, ecchymosis or deformity.  No specific area of point tenderness bilat knees. No edema, No calf tenderness, edema or asymmetry.; Neuro: AA&Ox3, Major CN grossly intact. No facial droop. Speech clear. Bilat LE's decreased ROM per hx of MS. Grips equal. Strength 5/5 equal bilat UE's and LE's..; Skin: Color normal, Warm, Dry.   ED Course  Procedures     EKG Interpretation None      MDM  MDM Reviewed: previous chart, nursing note and vitals Reviewed previous: labs and ECG Interpretation: labs, x-ray and CT scan     Results for orders placed or performed during the hospital encounter of 08/26/14  Basic metabolic panel  Result Value Ref Range   Sodium 142 137 - 147 mEq/L   Potassium 3.3 (L) 3.7 - 5.3 mEq/L   Chloride 102 96 - 112 mEq/L   CO2 27 19 - 32 mEq/L   Glucose, Bld 236 (H) 70 - 99 mg/dL   BUN 14 6 - 23 mg/dL   Creatinine, Ser 1.97 0.50 - 1.10 mg/dL   Calcium 9.6 8.4 - 58.8 mg/dL   GFR calc non Af Amer 90 (L) >90 mL/min   GFR calc Af Amer >90 >90 mL/min   Anion gap 13 5 - 15  Troponin I  Result Value Ref Range   Troponin I <0.30 <0.30 ng/mL  CBC with Differential  Result Value Ref Range   WBC 6.3 4.0 - 10.5 K/uL   RBC 4.70 3.87 - 5.11 MIL/uL   Hemoglobin 13.1 12.0 - 15.0 g/dL   HCT 32.5 49.8 - 26.4 %   MCV 82.8 78.0 - 100.0 fL   MCH 27.9 26.0 - 34.0 pg   MCHC 33.7 30.0 - 36.0 g/dL   RDW 15.8 30.9 - 40.7 %   Platelets 192 150 - 400 K/uL   Neutrophils Relative % 66 43 - 77 %   Neutro Abs 4.2 1.7 - 7.7 K/uL   Lymphocytes Relative 26 12 - 46 %   Lymphs Abs 1.6 0.7 - 4.0 K/uL   Monocytes Relative 5 3 - 12 %    Monocytes Absolute 0.3 0.1 - 1.0 K/uL   Eosinophils Relative 2 0 - 5 %   Eosinophils Absolute 0.1 0.0 - 0.7 K/uL   Basophils Relative 1 0 - 1 %   Basophils Absolute 0.0 0.0 - 0.1 K/uL   Dg Chest 2 View 08/26/2014   CLINICAL DATA:  Loss of consciousness  EXAM: CHEST  2 VIEW  COMPARISON:  01/06/2013  FINDINGS: Stable cardiomegaly, likely with left ventricular enlargement. The aorta is tortuous, similar to prior. Cannot exclude an aortic aneurysm due to the marked tortuosity of the aorta. There is no edema, consolidation, effusion, or pneumothorax.  IMPRESSION: 1. Unchanged cardiomegaly and aortic  tortuosity. An aortic aneurysm cannot be excluded on this study. 2. No edema or pneumonia.   Electronically Signed   By: Tiburcio Pea M.D.   On: 08/26/2014 23:35   Ct Head Wo Contrast 08/27/2014   CLINICAL DATA:  Syncope.  EXAM: CT HEAD WITHOUT CONTRAST  TECHNIQUE: Contiguous axial images were obtained from the base of the skull through the vertex without intravenous contrast.  COMPARISON:  CT scan of January 06, 2013.  FINDINGS: Bony calvarium appears intact. Mild diffuse cortical atrophy. Mild chronic ischemic white matter disease. Old lacunar infarctions are noted in the basal ganglia bilaterally. Old lacunar infarction is noted in the pons. No mass effect or midline shift is noted. Ventricular size is within normal limits. There is no evidence of mass lesion, hemorrhage or acute infarction.  IMPRESSION: Mild diffuse cortical atrophy. Mild chronic ischemic white matter disease. No acute intracranial abnormality seen.   Electronically Signed   By: Roque Lias M.D.   On: 08/27/2014 00:16    0020:  Aortic tortuosity seen on CXR; no previous CT aorta on EPIC chart review. Will obtain CT aorta to clarify CXR finding. Pt and family are in agreement with this plan. Udip and EKG also pending. Family is requesting "xray's of her knees to see if there is arthritis." Will order per family request.  Family states if  "everything is normal" they would like to take her home tonight, as pt has hx of recurrent syncope and both pt and family state tonight episode was per her previous episodes. Sign out to Dr. Denton Lank.    Samuel Jester, DO 08/27/14 [phone]

## 2014-08-28 LAB — URINE CULTURE: Colony Count: 35000

## 2014-10-20 ENCOUNTER — Encounter (HOSPITAL_COMMUNITY): Payer: Self-pay | Admitting: Emergency Medicine

## 2014-10-20 ENCOUNTER — Emergency Department (HOSPITAL_COMMUNITY): Payer: Medicare Other

## 2014-10-20 ENCOUNTER — Inpatient Hospital Stay (HOSPITAL_COMMUNITY)
Admission: EM | Admit: 2014-10-20 | Discharge: 2014-10-25 | DRG: 419 | Disposition: A | Discharge status: Home / Self Care | Payer: Medicare Other | Attending: Internal Medicine | Admitting: Internal Medicine

## 2014-10-20 ENCOUNTER — Inpatient Hospital Stay (HOSPITAL_COMMUNITY): Payer: Medicare Other

## 2014-10-20 DIAGNOSIS — R1011 Right upper quadrant pain: Secondary | ICD-10-CM

## 2014-10-20 DIAGNOSIS — E785 Hyperlipidemia, unspecified: Secondary | ICD-10-CM | POA: Diagnosis present

## 2014-10-20 DIAGNOSIS — E876 Hypokalemia: Secondary | ICD-10-CM | POA: Diagnosis present

## 2014-10-20 DIAGNOSIS — I10 Essential (primary) hypertension: Secondary | ICD-10-CM | POA: Diagnosis present

## 2014-10-20 DIAGNOSIS — Z79899 Other long term (current) drug therapy: Secondary | ICD-10-CM

## 2014-10-20 DIAGNOSIS — K8000 Calculus of gallbladder with acute cholecystitis without obstruction: Secondary | ICD-10-CM | POA: Diagnosis present

## 2014-10-20 DIAGNOSIS — I252 Old myocardial infarction: Secondary | ICD-10-CM | POA: Diagnosis not present

## 2014-10-20 DIAGNOSIS — Z833 Family history of diabetes mellitus: Secondary | ICD-10-CM

## 2014-10-20 DIAGNOSIS — K5909 Other constipation: Secondary | ICD-10-CM | POA: Diagnosis present

## 2014-10-20 DIAGNOSIS — F419 Anxiety disorder, unspecified: Secondary | ICD-10-CM | POA: Diagnosis present

## 2014-10-20 DIAGNOSIS — R740 Nonspecific elevation of levels of transaminase and lactic acid dehydrogenase [LDH]: Secondary | ICD-10-CM | POA: Diagnosis present

## 2014-10-20 DIAGNOSIS — K819 Cholecystitis, unspecified: Secondary | ICD-10-CM | POA: Diagnosis present

## 2014-10-20 DIAGNOSIS — Z794 Long term (current) use of insulin: Secondary | ICD-10-CM | POA: Diagnosis not present

## 2014-10-20 DIAGNOSIS — G35 Multiple sclerosis: Secondary | ICD-10-CM | POA: Diagnosis present

## 2014-10-20 DIAGNOSIS — R945 Abnormal results of liver function studies: Secondary | ICD-10-CM

## 2014-10-20 DIAGNOSIS — E119 Type 2 diabetes mellitus without complications: Secondary | ICD-10-CM

## 2014-10-20 DIAGNOSIS — R109 Unspecified abdominal pain: Secondary | ICD-10-CM

## 2014-10-20 DIAGNOSIS — K810 Acute cholecystitis: Secondary | ICD-10-CM | POA: Diagnosis present

## 2014-10-20 DIAGNOSIS — M25569 Pain in unspecified knee: Secondary | ICD-10-CM | POA: Diagnosis present

## 2014-10-20 DIAGNOSIS — I639 Cerebral infarction, unspecified: Secondary | ICD-10-CM

## 2014-10-20 DIAGNOSIS — E1165 Type 2 diabetes mellitus with hyperglycemia: Secondary | ICD-10-CM | POA: Diagnosis present

## 2014-10-20 DIAGNOSIS — K8020 Calculus of gallbladder without cholecystitis without obstruction: Secondary | ICD-10-CM

## 2014-10-20 DIAGNOSIS — R7989 Other specified abnormal findings of blood chemistry: Secondary | ICD-10-CM

## 2014-10-20 DIAGNOSIS — Z8673 Personal history of transient ischemic attack (TIA), and cerebral infarction without residual deficits: Secondary | ICD-10-CM | POA: Diagnosis not present

## 2014-10-20 LAB — CBC WITH DIFFERENTIAL/PLATELET
Basophils Absolute: 0 10*3/uL (ref 0.0–0.1)
Basophils Relative: 0 % (ref 0–1)
Eosinophils Absolute: 0 10*3/uL (ref 0.0–0.7)
Eosinophils Relative: 0 % (ref 0–5)
HCT: 37 % (ref 36.0–46.0)
Hemoglobin: 12.9 g/dL (ref 12.0–15.0)
Lymphocytes Relative: 13 % (ref 12–46)
Lymphs Abs: 0.9 10*3/uL (ref 0.7–4.0)
MCH: 28.5 pg (ref 26.0–34.0)
MCHC: 34.9 g/dL (ref 30.0–36.0)
MCV: 81.9 fL (ref 78.0–100.0)
MONO ABS: 0.2 10*3/uL (ref 0.1–1.0)
MONOS PCT: 3 % (ref 3–12)
NEUTROS PCT: 84 % — AB (ref 43–77)
Neutro Abs: 5.7 10*3/uL (ref 1.7–7.7)
Platelets: 183 10*3/uL (ref 150–400)
RBC: 4.52 MIL/uL (ref 3.87–5.11)
RDW: 13.9 % (ref 11.5–15.5)
WBC: 6.8 10*3/uL (ref 4.0–10.5)

## 2014-10-20 LAB — I-STAT CG4 LACTIC ACID, ED
LACTIC ACID, VENOUS: 2.5 mmol/L — AB (ref 0.5–2.0)
LACTIC ACID, VENOUS: 2.04 mmol/L — AB (ref 0.5–2.0)

## 2014-10-20 LAB — URINALYSIS, ROUTINE W REFLEX MICROSCOPIC
Glucose, UA: 250 mg/dL — AB
Hgb urine dipstick: NEGATIVE
Ketones, ur: NEGATIVE mg/dL
LEUKOCYTES UA: NEGATIVE
Nitrite: NEGATIVE
Protein, ur: NEGATIVE mg/dL
Specific Gravity, Urine: 1.023 (ref 1.005–1.030)
Urobilinogen, UA: 4 mg/dL — ABNORMAL HIGH (ref 0.0–1.0)
pH: 6 (ref 5.0–8.0)

## 2014-10-20 LAB — COMPREHENSIVE METABOLIC PANEL
ALT: 305 U/L — ABNORMAL HIGH (ref 0–35)
ANION GAP: 7 (ref 5–15)
AST: 585 U/L — ABNORMAL HIGH (ref 0–37)
Albumin: 3 g/dL — ABNORMAL LOW (ref 3.5–5.2)
Alkaline Phosphatase: 70 U/L (ref 39–117)
BUN: 12 mg/dL (ref 6–23)
CALCIUM: 8.8 mg/dL (ref 8.4–10.5)
CO2: 30 mmol/L (ref 19–32)
CREATININE: 0.53 mg/dL (ref 0.50–1.10)
Chloride: 101 mmol/L (ref 96–112)
GFR calc Af Amer: >90 mL/min (ref >90)
GLUCOSE: 247 mg/dL — AB (ref 70–99)
POTASSIUM: 2.9 mmol/L — AB (ref 3.5–5.1)
Sodium: 138 mmol/L (ref 135–145)
Total Bilirubin: 1.2 mg/dL (ref 0.3–1.2)
Total Protein: 6.7 g/dL (ref 6.0–8.3)

## 2014-10-20 LAB — LIPASE, BLOOD: Lipase: 26 U/L (ref 11–59)

## 2014-10-20 MED ORDER — MORPHINE SULFATE 2 MG/ML IJ SOLN
1.0000 mg | INTRAMUSCULAR | Status: DC | PRN
  Administered 2014-10-21: 1 mg via INTRAVENOUS
  Filled 2014-10-20: qty 1

## 2014-10-20 MED ORDER — POTASSIUM CHLORIDE 20 MEQ/15ML (10%) PO SOLN
40.0000 meq | Freq: Once | ORAL | Status: AC
  Administered 2014-10-20: 40 meq via ORAL
  Filled 2014-10-20: qty 30

## 2014-10-20 MED ORDER — SODIUM CHLORIDE 0.9 % IV SOLN
Freq: Once | INTRAVENOUS | Status: AC
  Administered 2014-10-20: 19:00:00 via INTRAVENOUS

## 2014-10-20 MED ORDER — POTASSIUM CHLORIDE IN NACL 20-0.9 MEQ/L-% IV SOLN
INTRAVENOUS | Status: DC
  Administered 2014-10-21 – 2014-10-23 (×4): via INTRAVENOUS
  Filled 2014-10-20 (×8): qty 1000

## 2014-10-20 MED ORDER — POTASSIUM CHLORIDE 10 MEQ/100ML IV SOLN
10.0000 meq | Freq: Once | INTRAVENOUS | Status: AC
  Administered 2014-10-20: 10 meq via INTRAVENOUS
  Filled 2014-10-20: qty 100

## 2014-10-20 MED ORDER — IOHEXOL 300 MG/ML  SOLN
100.0000 mL | Freq: Once | INTRAMUSCULAR | Status: AC | PRN
  Administered 2014-10-20: 100 mL via INTRAVENOUS

## 2014-10-20 MED ORDER — INSULIN ASPART 100 UNIT/ML ~~LOC~~ SOLN
0.0000 [IU] | SUBCUTANEOUS | Status: DC
  Administered 2014-10-20: 1 [IU] via SUBCUTANEOUS
  Filled 2014-10-20: qty 1

## 2014-10-20 MED ORDER — POTASSIUM CHLORIDE 10 MEQ/100ML IV SOLN
10.0000 meq | INTRAVENOUS | Status: AC
  Administered 2014-10-21 (×4): 10 meq via INTRAVENOUS
  Filled 2014-10-20 (×4): qty 100

## 2014-10-20 MED ORDER — HYDRALAZINE HCL 20 MG/ML IJ SOLN
10.0000 mg | INTRAMUSCULAR | Status: DC | PRN
  Administered 2014-10-20 – 2014-10-23 (×6): 10 mg via INTRAVENOUS
  Filled 2014-10-20 (×7): qty 1

## 2014-10-20 MED ORDER — SODIUM CHLORIDE 0.9 % IV BOLUS (SEPSIS)
1000.0000 mL | Freq: Once | INTRAVENOUS | Status: AC
  Administered 2014-10-20: 1000 mL via INTRAVENOUS

## 2014-10-20 MED ORDER — IOHEXOL 300 MG/ML  SOLN: 25.0000 mL | INTRAMUSCULAR | Status: AC

## 2014-10-20 NOTE — ED Notes (Signed)
Pt from home via [facility] with c/o "a bad feeling in my stomach and it won't go away" since 4 am.  EMS reports no BM x 1 week per pt.  Hx of syncopal vasovagal response with BM per daughter.  Pt in NAD, A&O.

## 2014-10-20 NOTE — ED Notes (Signed)
cbg is 123 

## 2014-10-20 NOTE — ED Notes (Signed)
Called ultrasound, ETA is approximately 20 minutes.

## 2014-10-20 NOTE — ED Notes (Signed)
Discussed with family and patient that ultrasound is scheduled before transfer to new room.

## 2014-10-20 NOTE — ED Notes (Signed)
Pt had large BM, cleaned up pt and changed linens.

## 2014-10-20 NOTE — ED Notes (Signed)
Pt taken to XR.  

## 2014-10-20 NOTE — Consult Note (Signed)
Reason for Consult:cholecystitis Referring Physician: Jeannett Senior, PAS  Hannah Lamb is an 74 y.o. female.  HPI: Missi developed malaise earlier this morning. She had associated nausea and vomiting. She drank some prune juice then also had diarrhea which she had trouble controlling. This was unusual for her. She continued feeling poorly and was brought to the emergency department by EMS. Evaluation here has revealed elevated transaminases. CT scan of the abdomen and pelvis was done showing gallbladder wall thickening with pericholecystic fluid consistent with acute cholecystitis. I was asked to see her for surgical management. She denies abdominal pain. She does have a history of multiple medical problems including MS, insulin-dependent diabetes mellitus, and hypertension. There is history of CVA in the chart but she does deny this. Her daughter is present and contributes to history.  Past Medical History  Diagnosis Date  . Hypertension   . MS (multiple sclerosis)   . Diabetes mellitus   . Goiter   . Hyperlipidemia   . Myocardial infarction   . Anxiety   . Stroke   . Syncope     recurrent  . Bilateral chronic knee pain     Past Surgical History  Procedure Laterality Date  . Abdominal hysterectomy      Family History  Problem Relation Age of Onset  . Diabetes Father     Social History:  reports that she has never smoked. She does not have any smokeless tobacco history on file. She reports that she does not drink alcohol or use illicit drugs.  Allergies:  Allergies  Allergen Reactions  . Sulfa Drugs Cross Reactors Other (See Comments)    dizziness  . Lipitor [Atorvastatin] Other (See Comments)    Muscle weakness    Medications: Prior to Admission:  (Not in a hospital admission)  Results for orders placed or performed during the hospital encounter of 10/20/14 (from the past 48 hour(s))  CBC with Differential     Status: Abnormal   Collection Time: 10/20/14  4:34 PM   Result Value Ref Range   WBC 6.8 4.0 - 10.5 K/uL   RBC 4.52 3.87 - 5.11 MIL/uL   Hemoglobin 12.9 12.0 - 15.0 g/dL   HCT 37.0 36.0 - 46.0 %   MCV 81.9 78.0 - 100.0 fL   MCH 28.5 26.0 - 34.0 pg   MCHC 34.9 30.0 - 36.0 g/dL   RDW 13.9 11.5 - 15.5 %   Platelets 183 150 - 400 K/uL   Neutrophils Relative % 84 (H) 43 - 77 %   Neutro Abs 5.7 1.7 - 7.7 K/uL   Lymphocytes Relative 13 12 - 46 %   Lymphs Abs 0.9 0.7 - 4.0 K/uL   Monocytes Relative 3 3 - 12 %   Monocytes Absolute 0.2 0.1 - 1.0 K/uL   Eosinophils Relative 0 0 - 5 %   Eosinophils Absolute 0.0 0.0 - 0.7 K/uL   Basophils Relative 0 0 - 1 %   Basophils Absolute 0.0 0.0 - 0.1 K/uL  Lipase, blood     Status: None   Collection Time: 10/20/14  4:34 PM  Result Value Ref Range   Lipase 26 11 - 59 U/L  Comprehensive metabolic panel     Status: Abnormal   Collection Time: 10/20/14  4:34 PM  Result Value Ref Range   Sodium 138 135 - 145 mmol/L   Potassium 2.9 (L) 3.5 - 5.1 mmol/L   Chloride 101 96 - 112 mmol/L   CO2 30 19 - 32 mmol/L  Glucose, Bld 247 (H) 70 - 99 mg/dL   BUN 12 6 - 23 mg/dL   Creatinine, Ser 9.52 0.50 - 1.10 mg/dL   Calcium 8.8 8.4 - 56.3 mg/dL   Total Protein 6.7 6.0 - 8.3 g/dL   Albumin 3.0 (L) 3.5 - 5.2 g/dL   AST 220 (H) 0 - 37 U/L   ALT 305 (H) 0 - 35 U/L   Alkaline Phosphatase 70 39 - 117 U/L   Total Bilirubin 1.2 0.3 - 1.2 mg/dL   GFR calc non Af Amer >90 >90 mL/min   GFR calc Af Amer >90 >90 mL/min    Comment: (NOTE) The eGFR has been calculated using the CKD EPI equation. This calculation has not been validated in all clinical situations. eGFR's persistently <90 mL/min signify possible Chronic Kidney Disease.    Anion gap 7 5 - 15  I-Stat CG4 Lactic Acid, ED     Status: Abnormal   Collection Time: 10/20/14  4:49 PM  Result Value Ref Range   Lactic Acid, Venous 2.50 (HH) 0.5 - 2.0 mmol/L   Comment NOTIFIED PHYSICIAN   Urinalysis, Routine w reflex microscopic     Status: Abnormal   Collection  Time: 10/20/14  5:19 PM  Result Value Ref Range   Color, Urine AMBER (A) YELLOW    Comment: BIOCHEMICALS MAY BE AFFECTED BY COLOR   APPearance CLEAR CLEAR   Specific Gravity, Urine 1.023 1.005 - 1.030   pH 6.0 5.0 - 8.0   Glucose, UA 250 (A) NEGATIVE mg/dL   Hgb urine dipstick NEGATIVE NEGATIVE   Bilirubin Urine SMALL (A) NEGATIVE   Ketones, ur NEGATIVE NEGATIVE mg/dL   Protein, ur NEGATIVE NEGATIVE mg/dL   Urobilinogen, UA 4.0 (H) 0.0 - 1.0 mg/dL   Nitrite NEGATIVE NEGATIVE   Leukocytes, UA NEGATIVE NEGATIVE    Comment: MICROSCOPIC NOT DONE ON URINES WITH NEGATIVE PROTEIN, BLOOD, LEUKOCYTES, NITRITE, OR GLUCOSE <1000 mg/dL.  I-Stat CG4 Lactic Acid, ED     Status: Abnormal   Collection Time: 10/20/14  7:21 PM  Result Value Ref Range   Lactic Acid, Venous 2.04 (HH) 0.5 - 2.0 mmol/L   Comment NOTIFIED PHYSICIAN     Ct Abdomen Pelvis W Contrast  10/20/2014   CLINICAL DATA:  Abdominal pain.  EXAM: CT ABDOMEN AND PELVIS WITH CONTRAST  TECHNIQUE: Multidetector CT imaging of the abdomen and pelvis was performed using the standard protocol following bolus administration of intravenous contrast.  CONTRAST:  OMNIPAQUE IOHEXOL 300 MG/ML  SOLN  COMPARISON:  08/27/2014  FINDINGS: Tortuous thoracic aorta.  Atelectasis in the lung bases.  The gallbladder is moderately distended. Increased density in the gallbladder suggests sludge. No discrete stones are visualized. There is mild gallbladder wall thickening and pericholecystic edema. Small amount of free fluid in the upper abdomen around the liver and gallbladder, likely reactive. Changes are nonspecific but suggest possible cholecystitis. No bile duct dilatation. The liver, spleen, pancreas, gallbladder, kidneys, abdominal aorta, inferior vena cava, and retroperitoneal lymph nodes are unremarkable. Stomach and small bowel appear normal. Contrast material flows through to the colon without evidence of bowel obstruction. Diffusely stool-filled colon  without wall thickening or abnormal distention. No free air or free fluid in the abdomen.  Pelvis: The appendix is not identified. Bladder wall is not thickened. Calcifications layering along the dependent portion of the bladder probably representing bladder stones. Bladder wall calcification is not excluded. Uterus is surgically absent. No pelvic mass or lymphadenopathy. No free or loculated pelvic fluid collections.  Degenerative changes in the spine and hips. No destructive bone lesions.  IMPRESSION: Distended gallbladder with probable sludge. Gallbladder wall thickening and edema. Free fluid in the right upper quadrant. Findings are suspicious for cholecystitis. Calcifications in the bladder probably represent bladder stones. Bladder wall calcification not excluded.   Electronically Signed   By: Lucienne Capers M.D.   On: 10/20/2014 21:04   Dg Abd Acute W/chest  10/20/2014   CLINICAL DATA:  Abdominal pain. Pain started last night. Pain relieved with bowel movement and numerous department  EXAM: ACUTE ABDOMEN SERIES (ABDOMEN 2 VIEW & CHEST 1 VIEW)  COMPARISON:  CT 08/27/2014  FINDINGS: No dilated loops of large or small bowel. There is a moderate volume stool in the transverse colon. Large volume stool in the rectum measuring approximate 7 cm diameter. There is no intraperitoneal free air on the lateral projection. No free air beneath hemidiaphragms. Heart is enlarged.  IMPRESSION: 1. No evidence bowel obstruction or intraperitoneal free air. 2. Moderate to large volume stool in the colon and rectum.   Electronically Signed   By: Suzy Bouchard M.D.   On: 10/20/2014 17:54    Review of Systems  Constitutional: Positive for malaise/fatigue.  HENT: Negative.   Eyes: Negative.   Respiratory: Negative.   Cardiovascular: Negative for chest pain and palpitations.  Gastrointestinal: Positive for nausea, vomiting and diarrhea. Negative for abdominal pain and blood in stool.  Genitourinary: Negative.    Musculoskeletal:       Chronic knee pain  Skin: Negative.   Neurological:       Syncope episodes  Endo/Heme/Allergies: Negative.   Psychiatric/Behavioral: Negative.    Blood pressure 168/92, pulse 73, temperature 98.6 F (37 C), temperature source Oral, resp. rate 17, SpO2 100 %. Physical Exam  Constitutional: She is oriented to person, place, and time. She appears well-developed and well-nourished. No distress.  HENT:  Head: Normocephalic.  Right Ear: External ear normal.  Left Ear: External ear normal.  Nose: Nose normal.  Mouth/Throat: Oropharynx is clear and moist. No oropharyngeal exudate.  Eyes: EOM are normal. Pupils are equal, round, and reactive to light. Right eye exhibits no discharge. Left eye exhibits no discharge. No scleral icterus.  Neck: Normal range of motion. Neck supple. No tracheal deviation present.  Cardiovascular: Normal rate, normal heart sounds and intact distal pulses.   Respiratory: Effort normal and breath sounds normal. No respiratory distress. She has no wheezes. She has no rales.  GI: Soft. She exhibits no distension. There is no tenderness. There is no rebound and no guarding.  No appreciable tenderness in the right upper quadrant or elsewhere  Musculoskeletal: She exhibits edema.  Edema bilateral lower extremities including knees  Neurological: She is alert and oriented to person, place, and time. She exhibits normal muscle tone.  Skin: Skin is warm.  Psychiatric: She has a normal mood and affect.    Assessment/Plan: Elevated transaminases with abnormal gallbladder on CT scan - recommend right upper quadrant ultrasound, IV fluids, IV antibiotics, NPO. Agree with medical admission for treatment of her multiple medical problems and perioperative evaluation should she need cholecystectomy. Repeat LFTs in a.m. Further consideration for laparoscopic cholecystectomy at that time. Plan was discussed in detail the patient and her daughter.    Shaney Deckman E 10/20/2014, 9:56 PM

## 2014-10-20 NOTE — ED Notes (Signed)
Cleaned up patient after urinating 3 times.

## 2014-10-20 NOTE — ED Notes (Signed)
Awaiting transport to ultrasound prior to transfer to new room.

## 2014-10-20 NOTE — ED Notes (Signed)
NOTIFIED TPiedad Climes ,PA IN PERSON FOR PATIENTS LAB RESULTS OF CG4+ LACTIC ACID @17 :00PM ,10/20/2014.

## 2014-10-20 NOTE — ED Notes (Signed)
Attempted to call ultrasound for eta at [phone].

## 2014-10-20 NOTE — ED Provider Notes (Signed)
CSN: [id number]     Arrival date & time 10/20/14  1532 History   First MD Initiated Contact with Patient 10/20/14 1539     Chief Complaint  Patient presents with  . Abdominal Pain     (Consider location/radiation/quality/duration/timing/severity/associated sxs/prior Treatment) HPI Hannah Lamb is a 74 y.o. female with history of MS, diabetes, hypertension, coronary disease, stroke, presents to emergency department complaining of abdominal discomfort, nausea, inability to have a bowel movement. She states she has history of chronic constipation, states she hasn't been able to use bathroom in 1 week. She states today she has been trying to go but couldn't. Her daughter gave her some prune juice. She admits to nausea, one episode of vomiting earlier. States earlier today had abdominal pain which she complained about to the daughter. Daughter states that she has history of vagal episodes when she has abdominal discomfort, and they kept her laying down today. They did not try any laxatives or enemas at home. She denies history of abdominal surgeries. She denies chest pain, no sob, no cough or congestion. [name] states her urine has been malodorous. No fever or chills. Pt has been eating and drinking well.   Past Medical History  Diagnosis Date  . Hypertension   . MS (multiple sclerosis)   . Diabetes mellitus   . Goiter   . Hyperlipidemia   . Myocardial infarction   . Anxiety   . Stroke   . Syncope     recurrent  . Bilateral chronic knee pain    Past Surgical History  Procedure Laterality Date  . Abdominal hysterectomy     Family History  Problem Relation Age of Onset  . Diabetes Father    History  Substance Use Topics  . Smoking status: Never Smoker   . Smokeless tobacco: Not on file  . Alcohol Use: No   OB History    No data available     Review of Systems  Constitutional: Negative for fever and chills.  HENT: Negative.   Respiratory: Negative for cough, chest  tightness and shortness of breath.   Cardiovascular: Negative for chest pain, palpitations and leg swelling.  Gastrointestinal: Positive for abdominal pain and constipation. Negative for nausea, vomiting and diarrhea.  Genitourinary: Negative for dysuria, flank pain and pelvic pain.  Musculoskeletal: Negative for myalgias, arthralgias, neck pain and neck stiffness.  Skin: Negative for rash.  Neurological: Positive for weakness. Negative for dizziness and headaches.  All other systems reviewed and are negative.     Allergies  Sulfa drugs cross reactors and Lipitor  Home Medications   Prior to Admission medications   Medication Sig Start Date End Date Taking? Authorizing Provider  amLODipine (NORVASC) 10 MG tablet Take 1 tablet (10 mg total) by mouth daily. 01/08/13   Laveda Norman, MD  HUMULIN 70/30 (70-30) 100 UNIT/ML injection Inject 15 Units into the skin daily. 03/17/14   Historical Provider, MD  irbesartan (AVAPRO) 300 MG tablet Take 1 tablet (300 mg total) by mouth daily. 01/08/13   Laveda Norman, MD  traMADol (ULTRAM) 50 MG tablet Take 1 tablet (50 mg total) by mouth every 6 (six) hours as needed. 08/27/14   Suzi Roots, MD   BP 189/89 mmHg  Pulse 88  Temp(Src) 98.6 F (37 C) (Oral)  Resp 24  SpO2 97% Physical Exam  Constitutional: She is oriented to person, place, and time. She appears well-developed and well-nourished. No distress.  HENT:  Head: Normocephalic.  Eyes: Conjunctivae are  normal.  Neck: Neck supple.  Cardiovascular: Normal rate, regular rhythm and normal heart sounds.   Pulmonary/Chest: Effort normal and breath sounds normal. No respiratory distress. She has no wheezes. She has no rales.  Abdominal: Soft. She exhibits no distension. There is no tenderness. There is no rebound and no guarding.  Bowel sounds hyperactive  Musculoskeletal: She exhibits no edema.  Neurological: She is alert and oriented to person, place, and time.  Skin: Skin is warm and dry.   Psychiatric: She has a normal mood and affect. Her behavior is normal.  Nursing note and vitals reviewed.   ED Course  Procedures (including critical care time) Labs Review Labs Reviewed  CBC WITH DIFFERENTIAL/PLATELET  LIPASE, BLOOD  URINALYSIS, ROUTINE W REFLEX MICROSCOPIC  COMPREHENSIVE METABOLIC PANEL  I-STAT TROPOININ, ED  I-STAT CG4 LACTIC ACID, ED    Imaging Review No results found.   EKG Interpretation None      MDM   Final diagnoses:  Hypokalemia  Cholecystitis  Patient is here with abdominal discomfort, had pain earlier but none at present. States "I just do not feel good." History of constipation, last bowel movement a week ago. States she feels like she needs to go but isn't unable to have a bowel movement. She admits to nausea and vomiting today. No fever, no associated other symptoms. We'll check labs, x-rays, urinalysis. Patient's family is at bedside and is aware of the plan. Final signs are normal except for hypertensive at 189/89. Afebrile, heart rate is normal.  Patient has elevated lactic acid at 2.5. AST and ALTs elevated at 585 and 305. No history of liver problems. Patient is hypokalemic with potassium of 2.9. Given elevated lactic acid and LFTs, will get CT of abdomen and pelvis for further evaluation.  CT of abdomen and pelvis is consistent with acute cholecystitis. Discussed with Dr. Janee Morn with surgery, will come by and consult on patient. Spoke with [facility], they  will admit.  Discussed results with pt and family, all questions answered. Pt is hypertensive, will need to be monitored. Asymptomatic for her htn.   Filed Vitals:   10/20/14 2300 10/20/14 2321 10/20/14 2327 10/21/14 0100  BP: 192/81 187/83 187/83 197/77  Pulse:  67    Temp:    98.3 F (36.8 C)  TempSrc:    Oral  Resp: SpO2: 100% 100%  100%       Lottie Mussel, PA-C 10/21/14 0111  Mirian Mo, MD 10/23/14 [phone]

## 2014-10-20 NOTE — ED Notes (Signed)
Patient is now traveling to [place] and then will be sent to new room. Family will meet the patient in the new room. Called receiving nurse to inform at [id number]. Tremaine, RN acknowledges and prepares for arrival.

## 2014-10-20 NOTE — ED Notes (Signed)
MD at the bedside  

## 2014-10-20 NOTE — H&P (Signed)
[facility] History and Physical  Hannah Lamb WJX:[id number] DOB: 09-Jul-1941 DOA: 10/20/2014  Referring physician: ER physician. PCP: Lonia Blood, MD   Chief Complaint: Nausea vomiting.  HPI: Hannah Lamb is a 74 y.o. female with history of multiple sclerosis with lower extremity weakness, diabetes mellitus, hypertension presents to the ER because of persistent nausea vomiting. Patient also has had initially some epigastric discomfort. Patient's symptoms started today morning. In the ER CT abdomen and pelvis shows features concerning for acute cholecystitis. Patient's LFTs were elevated. On-call surgeon Dr. Janee Morn was consulted and at this time patient has been admitted for further management of possible cholecystitis. Patient otherwise denies any chest pain or shortness of breath or any palpitations fever chills. Patient did have a bowel movement today, which as per the patient was normal.   Review of Systems: As presented in the history of presenting illness, rest negative.  Past Medical History  Diagnosis Date  . Hypertension   . MS (multiple sclerosis)   . Diabetes mellitus   . Goiter   . Hyperlipidemia   . Myocardial infarction   . Anxiety   . Stroke   . Syncope     recurrent  . Bilateral chronic knee pain    Past Surgical History  Procedure Laterality Date  . Abdominal hysterectomy     Social History:  reports that she has never smoked. She does not have any smokeless tobacco history on file. She reports that she does not drink alcohol or use illicit drugs. Where does patient live home. Can patient participate in ADLs? Yes.  Allergies  Allergen Reactions  . Sulfa Drugs Cross Reactors Other (See Comments)    dizziness  . Lipitor [Atorvastatin] Other (See Comments)    Muscle weakness    Family History:  Family History  Problem Relation Age of Onset  . Diabetes Father       Prior to Admission medications   Medication Sig Start Date End Date Taking?  Authorizing Provider  amLODipine (NORVASC) 10 MG tablet Take 1 tablet (10 mg total) by mouth daily. 01/08/13  Yes Laveda Norman, MD  HUMULIN 70/30 (70-30) 100 UNIT/ML injection Inject 15 Units into the skin daily. 03/17/14  Yes Historical Provider, MD  irbesartan (AVAPRO) 300 MG tablet Take 1 tablet (300 mg total) by mouth daily. 01/08/13  Yes Laveda Norman, MD  potassium chloride (K-DUR) 10 MEQ tablet Take 10 mEq by mouth daily.   Yes Historical Provider, MD  traMADol (ULTRAM) 50 MG tablet Take 1 tablet (50 mg total) by mouth every 6 (six) hours as needed. 08/27/14   Suzi Roots, MD    Physical Exam: Filed Vitals:   10/20/14 2145 10/20/14 2200 10/20/14 2215 10/20/14 2254  BP: 168/92 169/78 162/85 188/93  Pulse: 73 73 68 77  Temp:      TempSrc:      Resp: SpO2: 100% 100% 100% 100%     General:  Moderately built and nourished.  Eyes: Anicteric no pallor.  ENT: No discharge from the ears eyes nose and mouth.  Neck: No mass felt.  Cardiovascular: S1-S2 heard.  Respiratory: No rhonchi or crepitations.  Abdomen: Soft nontender bowel sounds present.  Skin: No rash.  Musculoskeletal: No edema.  Psychiatric: Appears normal.  Neurologic: Alert awake oriented to time place and person. Has lower extremity weakness.  Labs on Admission:  Basic Metabolic Panel:  Recent Labs Lab 10/20/14 1634  NA 138  K 2.9*  CL 101  CO2 30  GLUCOSE 247*  BUN 12  CREATININE 0.53  CALCIUM 8.8   Liver Function Tests:  Recent Labs Lab 10/20/14 1634  AST 585*  ALT 305*  ALKPHOS 70  BILITOT 1.2  PROT 6.7  ALBUMIN 3.0*    Recent Labs Lab 10/20/14 1634  LIPASE 26   No results for input(s): AMMONIA in the last 168 hours. CBC:  Recent Labs Lab 10/20/14 1634  WBC 6.8  NEUTROABS 5.7  HGB 12.9  HCT 37.0  MCV 81.9  PLT 183   Cardiac Enzymes: No results for input(s): CKTOTAL, CKMB, CKMBINDEX, TROPONINI in the last 168 hours.  BNP (last 3 results) No results for  input(s): BNP in the last 8760 hours.  ProBNP (last 3 results) No results for input(s): PROBNP in the last 8760 hours.  CBG: No results for input(s): GLUCAP in the last 168 hours.  Radiological Exams on Admission: Ct Abdomen Pelvis W Contrast  10/20/2014   CLINICAL DATA:  Abdominal pain.  EXAM: CT ABDOMEN AND PELVIS WITH CONTRAST  TECHNIQUE: Multidetector CT imaging of the abdomen and pelvis was performed using the standard protocol following bolus administration of intravenous contrast.  CONTRAST:  OMNIPAQUE IOHEXOL 300 MG/ML  SOLN  COMPARISON:  08/27/2014  FINDINGS: Tortuous thoracic aorta.  Atelectasis in the lung bases.  The gallbladder is moderately distended. Increased density in the gallbladder suggests sludge. No discrete stones are visualized. There is mild gallbladder wall thickening and pericholecystic edema. Small amount of free fluid in the upper abdomen around the liver and gallbladder, likely reactive. Changes are nonspecific but suggest possible cholecystitis. No bile duct dilatation. The liver, spleen, pancreas, gallbladder, kidneys, abdominal aorta, inferior vena cava, and retroperitoneal lymph nodes are unremarkable. Stomach and small bowel appear normal. Contrast material flows through to the colon without evidence of bowel obstruction. Diffusely stool-filled colon without wall thickening or abnormal distention. No free air or free fluid in the abdomen.  Pelvis: The appendix is not identified. Bladder wall is not thickened. Calcifications layering along the dependent portion of the bladder probably representing bladder stones. Bladder wall calcification is not excluded. Uterus is surgically absent. No pelvic mass or lymphadenopathy. No free or loculated pelvic fluid collections. Degenerative changes in the spine and hips. No destructive bone lesions.  IMPRESSION: Distended gallbladder with probable sludge. Gallbladder wall thickening and edema. Free fluid in the right upper  quadrant. Findings are suspicious for cholecystitis. Calcifications in the bladder probably represent bladder stones. Bladder wall calcification not excluded.   Electronically Signed   By: Burman Nieves M.D.   On: 10/20/2014 21:04   Dg Abd Acute W/chest  10/20/2014   CLINICAL DATA:  Abdominal pain. Pain started last night. Pain relieved with bowel movement and numerous department  EXAM: ACUTE ABDOMEN SERIES (ABDOMEN 2 VIEW & CHEST 1 VIEW)  COMPARISON:  CT 08/27/2014  FINDINGS: No dilated loops of large or small bowel. There is a moderate volume stool in the transverse colon. Large volume stool in the rectum measuring approximate 7 cm diameter. There is no intraperitoneal free air on the lateral projection. No free air beneath hemidiaphragms. Heart is enlarged.  IMPRESSION: 1. No evidence bowel obstruction or intraperitoneal free air. 2. Moderate to large volume stool in the colon and rectum.   Electronically Signed   By: Genevive Bi M.D.   On: 10/20/2014 17:54    EKG: Independently reviewed. Normal sinus rhythm with LVH with IVCD.  Assessment/Plan Principal Problem:   Cholecystitis Active Problems:   Hypertension  Diabetes mellitus type 2, controlled   Hypokalemia   Acute cholecystitis   1. Acute cholecystitis - patient is symptoms are concerning for acute cholecystitis. Appreciate surgery consult. At this time patient will be kept nothing by mouth. Sonogram of the abdomen has been ordered. Patient has been empirically placed on Zosyn. Further recommendations per surgery. 2. Elevated LFTs probably from #1 - follow LFTs. Follow sonogram. 3. Diabetes mellitus type 2 uncontrolled - since patient is nothing by mouth at this time I have placed patient on sliding scale coverage. Closely follow CBG trends. 4. Hypertension - since patient is nothing by mouth we'll keep patient on when necessary IV hydralazine for now. 5. Hypokalemia probably from nausea and vomiting and unable to take in orally  - replace and recheck metabolic panel. Check magnesium levels.   DVT Prophylaxis SCDs in anticipation of surgery.  Code Status: Full code.  Family Communication: Patient's daughter.  Disposition Plan: Admit to inpatient.    KAKRAKANDY,ARSHAD N. [facility] Pager [phone].  If 7PM-7AM, please contact night-coverage [email or web address] Password [facility] 10/20/2014, 10:58 PM

## 2014-10-21 ENCOUNTER — Encounter (HOSPITAL_COMMUNITY): Payer: Self-pay | Admitting: General Practice

## 2014-10-21 ENCOUNTER — Inpatient Hospital Stay (HOSPITAL_COMMUNITY): Payer: Medicare Other

## 2014-10-21 DIAGNOSIS — E876 Hypokalemia: Secondary | ICD-10-CM

## 2014-10-21 DIAGNOSIS — K810 Acute cholecystitis: Secondary | ICD-10-CM

## 2014-10-21 DIAGNOSIS — I10 Essential (primary) hypertension: Secondary | ICD-10-CM

## 2014-10-21 DIAGNOSIS — E119 Type 2 diabetes mellitus without complications: Secondary | ICD-10-CM

## 2014-10-21 DIAGNOSIS — K819 Cholecystitis, unspecified: Secondary | ICD-10-CM

## 2014-10-21 LAB — BASIC METABOLIC PANEL
Anion gap: 5 (ref 5–15)
BUN: 7 mg/dL (ref 6–23)
CO2: 30 mmol/L (ref 19–32)
CREATININE: 0.42 mg/dL — AB (ref 0.50–1.10)
Calcium: 8.7 mg/dL (ref 8.4–10.5)
Chloride: 105 mmol/L (ref 96–112)
GLUCOSE: 148 mg/dL — AB (ref 70–99)
POTASSIUM: 3.2 mmol/L — AB (ref 3.5–5.1)
Sodium: 140 mmol/L (ref 135–145)

## 2014-10-21 LAB — CBC WITH DIFFERENTIAL/PLATELET
BASOS ABS: 0 10*3/uL (ref 0.0–0.1)
BASOS PCT: 0 % (ref 0–1)
Eosinophils Absolute: 0.1 10*3/uL (ref 0.0–0.7)
Eosinophils Relative: 2 % (ref 0–5)
HCT: 38.4 % (ref 36.0–46.0)
HEMOGLOBIN: 13.2 g/dL (ref 12.0–15.0)
Lymphocytes Relative: 25 % (ref 12–46)
Lymphs Abs: 1.2 10*3/uL (ref 0.7–4.0)
MCH: 28.1 pg (ref 26.0–34.0)
MCHC: 34.4 g/dL (ref 30.0–36.0)
MCV: 81.9 fL (ref 78.0–100.0)
Monocytes Absolute: 0.5 10*3/uL (ref 0.1–1.0)
Monocytes Relative: 9 % (ref 3–12)
NEUTROS ABS: 3.2 10*3/uL (ref 1.7–7.7)
NEUTROS PCT: 64 % (ref 43–77)
Platelets: 188 10*3/uL (ref 150–400)
RBC: 4.69 MIL/uL (ref 3.87–5.11)
RDW: 14 % (ref 11.5–15.5)
WBC: 5 10*3/uL (ref 4.0–10.5)

## 2014-10-21 LAB — HEPATIC FUNCTION PANEL
ALT: 589 U/L — ABNORMAL HIGH (ref 0–35)
AST: 836 U/L — AB (ref 0–37)
Albumin: 3.1 g/dL — ABNORMAL LOW (ref 3.5–5.2)
Alkaline Phosphatase: 93 U/L (ref 39–117)
BILIRUBIN TOTAL: 2.5 mg/dL — AB (ref 0.3–1.2)
Bilirubin, Direct: 1.3 mg/dL — ABNORMAL HIGH (ref 0.0–0.5)
Indirect Bilirubin: 1.2 mg/dL — ABNORMAL HIGH (ref 0.3–0.9)
Total Protein: 6.9 g/dL (ref 6.0–8.3)

## 2014-10-21 LAB — GLUCOSE, CAPILLARY
GLUCOSE-CAPILLARY: 109 mg/dL — AB (ref 70–99)
Glucose-Capillary: 147 mg/dL — ABNORMAL HIGH (ref 70–99)

## 2014-10-21 LAB — MAGNESIUM: MAGNESIUM: 1.5 mg/dL (ref 1.5–2.5)

## 2014-10-21 MED ORDER — SINCALIDE 5 MCG IJ SOLR
INTRAMUSCULAR | Status: AC
  Filled 2014-10-21: qty 5

## 2014-10-21 MED ORDER — ONDANSETRON HCL 4 MG/2ML IJ SOLN
4.0000 mg | Freq: Four times a day (QID) | INTRAMUSCULAR | Status: DC | PRN
  Administered 2014-10-21: 4 mg via INTRAVENOUS
  Filled 2014-10-21: qty 2

## 2014-10-21 MED ORDER — INSULIN ASPART 100 UNIT/ML ~~LOC~~ SOLN
0.0000 [IU] | SUBCUTANEOUS | Status: DC
  Administered 2014-10-21: 2 [IU] via SUBCUTANEOUS
  Administered 2014-10-21: 1 [IU] via SUBCUTANEOUS
  Administered 2014-10-22: 2 [IU] via SUBCUTANEOUS
  Administered 2014-10-22 (×2): 1 [IU] via SUBCUTANEOUS

## 2014-10-21 MED ORDER — SODIUM CHLORIDE 0.9 % IV SOLN: INTRAVENOUS | Status: DC

## 2014-10-21 MED ORDER — ONDANSETRON HCL 4 MG PO TABS: 4.0000 mg | ORAL_TABLET | Freq: Four times a day (QID) | ORAL | Status: DC | PRN

## 2014-10-21 MED ORDER — PIPERACILLIN-TAZOBACTAM 3.375 G IVPB
3.3750 g | Freq: Three times a day (TID) | INTRAVENOUS | Status: DC
  Administered 2014-10-21 – 2014-10-25 (×15): 3.375 g via INTRAVENOUS
  Filled 2014-10-21 (×16): qty 50

## 2014-10-21 MED ORDER — ACETAMINOPHEN 650 MG RE SUPP: 650.0000 mg | Freq: Four times a day (QID) | RECTAL | Status: DC | PRN

## 2014-10-21 MED ORDER — TECHNETIUM TC 99M MEBROFENIN IV KIT
5.0000 | PACK | Freq: Once | INTRAVENOUS | Status: AC | PRN
  Administered 2014-10-21: 5 via INTRAVENOUS

## 2014-10-21 MED ORDER — POTASSIUM CHLORIDE 20 MEQ/15ML (10%) PO SOLN
40.0000 meq | Freq: Once | ORAL | Status: AC
  Administered 2014-10-21: 40 meq via ORAL
  Filled 2014-10-21: qty 30

## 2014-10-21 MED ORDER — SINCALIDE 5 MCG IJ SOLR
0.0200 ug/kg | Freq: Once | INTRAMUSCULAR | Status: AC
  Administered 2014-10-21: 1.5 ug via INTRAVENOUS
  Filled 2014-10-21: qty 5

## 2014-10-21 MED ORDER — ONDANSETRON HCL 4 MG/2ML IJ SOLN: 4.0000 mg | Freq: Three times a day (TID) | INTRAMUSCULAR | Status: DC | PRN

## 2014-10-21 MED ORDER — ACETAMINOPHEN 325 MG PO TABS
650.0000 mg | ORAL_TABLET | Freq: Four times a day (QID) | ORAL | Status: DC | PRN
Start: 2014-10-21 — End: 2014-10-25

## 2014-10-21 NOTE — Progress Notes (Signed)
10/21/2014 1600 Utilization review complete. Chart reviewed. Isidoro Donning RN CCM Case Mgmt phone [phone]

## 2014-10-21 NOTE — Progress Notes (Signed)
Patient ID: Hannah Lamb, female   DOB: 08-07-1941, 74 y.o.   MRN: [id number]     [place]      [address] [place], [place] [address]    Phone: [phone] FAX: [phone]     Subjective: Denies abdominal pain.  No n/v.  Afebrile.  Normal white count.    Objective:  Vital signs:  Filed Vitals:   10/20/14 2321 10/20/14 2327 10/21/14 0100 10/21/14 0630  BP: 187/83 187/83 197/77 173/78  Pulse: 67   62  Temp:   98.3 F (36.8 C) 98 F (36.7 C)  TempSrc:   Oral Oral  Resp: $Remo'24  22 18  'TogCY$ Height:   5' 8.4" (1.737 m)   Weight:   165 lb (74.844 kg)   SpO2: 100%  100% 100%    Last BM Date: 10/21/14  Intake/Output   Yesterday:    This shift:       Physical Exam: General: Pt awake/alert/oriented x4 in no acute distress Abdomen: Soft.  Nondistended.  Non tender.  No evidence of peritonitis.  No incarcerated hernias.   Problem List:   Principal Problem:   Cholecystitis Active Problems:   Hypertension   Diabetes mellitus type 2, controlled   Hypokalemia   Acute cholecystitis    Results:   Labs: Results for orders placed or performed during the hospital encounter of 10/20/14 (from the past 48 hour(s))  CBC with Differential     Status: Abnormal   Collection Time: 10/20/14  4:34 PM  Result Value Ref Range   WBC 6.8 4.0 - 10.5 K/uL   RBC 4.52 3.87 - 5.11 MIL/uL   Hemoglobin 12.9 12.0 - 15.0 g/dL   HCT 37.0 36.0 - 46.0 %   MCV 81.9 78.0 - 100.0 fL   MCH 28.5 26.0 - 34.0 pg   MCHC 34.9 30.0 - 36.0 g/dL   RDW 13.9 11.5 - 15.5 %   Platelets 183 150 - 400 K/uL   Neutrophils Relative % 84 (H) 43 - 77 %   Neutro Abs 5.7 1.7 - 7.7 K/uL   Lymphocytes Relative 13 12 - 46 %   Lymphs Abs 0.9 0.7 - 4.0 K/uL   Monocytes Relative 3 3 - 12 %   Monocytes Absolute 0.2 0.1 - 1.0 K/uL   Eosinophils Relative 0 0 - 5 %   Eosinophils Absolute 0.0 0.0 - 0.7 K/uL   Basophils Relative 0 0 - 1 %   Basophils Absolute 0.0 0.0 - 0.1  K/uL  Lipase, blood     Status: None   Collection Time: 10/20/14  4:34 PM  Result Value Ref Range   Lipase 26 11 - 59 U/L  Comprehensive metabolic panel     Status: Abnormal   Collection Time: 10/20/14  4:34 PM  Result Value Ref Range   Sodium 138 135 - 145 mmol/L   Potassium 2.9 (L) 3.5 - 5.1 mmol/L   Chloride 101 96 - 112 mmol/L   CO2 30 19 - 32 mmol/L   Glucose, Bld 247 (H) 70 - 99 mg/dL   BUN 12 6 - 23 mg/dL   Creatinine, Ser 0.53 0.50 - 1.10 mg/dL   Calcium 8.8 8.4 - 10.5 mg/dL   Total Protein 6.7 6.0 - 8.3 g/dL   Albumin 3.0 (L) 3.5 - 5.2 g/dL   AST 585 (H) 0 - 37 U/L   ALT 305 (H) 0 - 35 U/L   Alkaline Phosphatase 70 39 - 117  U/L   Total Bilirubin 1.2 0.3 - 1.2 mg/dL   GFR calc non Af Amer >90 >90 mL/min   GFR calc Af Amer >90 >90 mL/min    Comment: (NOTE) The eGFR has been calculated using the CKD EPI equation. This calculation has not been validated in all clinical situations. eGFR's persistently <90 mL/min signify possible Chronic Kidney Disease.    Anion gap 7 5 - 15  I-Stat CG4 Lactic Acid, ED     Status: Abnormal   Collection Time: 10/20/14  4:49 PM  Result Value Ref Range   Lactic Acid, Venous 2.50 (HH) 0.5 - 2.0 mmol/L   Comment NOTIFIED PHYSICIAN   Urinalysis, Routine w reflex microscopic     Status: Abnormal   Collection Time: 10/20/14  5:19 PM  Result Value Ref Range   Color, Urine AMBER (A) YELLOW    Comment: BIOCHEMICALS MAY BE AFFECTED BY COLOR   APPearance CLEAR CLEAR   Specific Gravity, Urine 1.023 1.005 - 1.030   pH 6.0 5.0 - 8.0   Glucose, UA 250 (A) NEGATIVE mg/dL   Hgb urine dipstick NEGATIVE NEGATIVE   Bilirubin Urine SMALL (A) NEGATIVE   Ketones, ur NEGATIVE NEGATIVE mg/dL   Protein, ur NEGATIVE NEGATIVE mg/dL   Urobilinogen, UA 4.0 (H) 0.0 - 1.0 mg/dL   Nitrite NEGATIVE NEGATIVE   Leukocytes, UA NEGATIVE NEGATIVE    Comment: MICROSCOPIC NOT DONE ON URINES WITH NEGATIVE PROTEIN, BLOOD, LEUKOCYTES, NITRITE, OR GLUCOSE <1000 mg/dL.   I-Stat CG4 Lactic Acid, ED     Status: Abnormal   Collection Time: 10/20/14  7:21 PM  Result Value Ref Range   Lactic Acid, Venous 2.04 (HH) 0.5 - 2.0 mmol/L   Comment NOTIFIED PHYSICIAN   Magnesium     Status: None   Collection Time: 10/21/14  1:48 AM  Result Value Ref Range   Magnesium 1.5 1.5 - 2.5 mg/dL  Hepatic function panel     Status: Abnormal   Collection Time: 10/21/14  1:48 AM  Result Value Ref Range   Total Protein 6.9 6.0 - 8.3 g/dL   Albumin 3.1 (L) 3.5 - 5.2 g/dL   AST 836 (H) 0 - 37 U/L   ALT 589 (H) 0 - 35 U/L   Alkaline Phosphatase 93 39 - 117 U/L   Total Bilirubin 2.5 (H) 0.3 - 1.2 mg/dL   Bilirubin, Direct 1.3 (H) 0.0 - 0.5 mg/dL   Indirect Bilirubin 1.2 (H) 0.3 - 0.9 mg/dL  CBC with Differential/Platelet     Status: None   Collection Time: 10/21/14  1:48 AM  Result Value Ref Range   WBC 5.0 4.0 - 10.5 K/uL   RBC 4.69 3.87 - 5.11 MIL/uL   Hemoglobin 13.2 12.0 - 15.0 g/dL   HCT 38.4 36.0 - 46.0 %   MCV 81.9 78.0 - 100.0 fL   MCH 28.1 26.0 - 34.0 pg   MCHC 34.4 30.0 - 36.0 g/dL   RDW 14.0 11.5 - 15.5 %   Platelets 188 150 - 400 K/uL   Neutrophils Relative % 64 43 - 77 %   Neutro Abs 3.2 1.7 - 7.7 K/uL   Lymphocytes Relative 25 12 - 46 %   Lymphs Abs 1.2 0.7 - 4.0 K/uL   Monocytes Relative 9 3 - 12 %   Monocytes Absolute 0.5 0.1 - 1.0 K/uL   Eosinophils Relative 2 0 - 5 %   Eosinophils Absolute 0.1 0.0 - 0.7 K/uL   Basophils Relative 0 0 - 1 %  Basophils Absolute 0.0 0.0 - 0.1 K/uL  Basic metabolic panel     Status: Abnormal   Collection Time: 10/21/14  1:48 AM  Result Value Ref Range   Sodium 140 135 - 145 mmol/L   Potassium 3.2 (L) 3.5 - 5.1 mmol/L   Chloride 105 96 - 112 mmol/L   CO2 30 19 - 32 mmol/L   Glucose, Bld 148 (H) 70 - 99 mg/dL   BUN 7 6 - 23 mg/dL   Creatinine, Ser 0.42 (L) 0.50 - 1.10 mg/dL   Calcium 8.7 8.4 - 10.5 mg/dL   GFR calc non Af Amer >90 >90 mL/min   GFR calc Af Amer >90 >90 mL/min    Comment: (NOTE) The eGFR has  been calculated using the CKD EPI equation. This calculation has not been validated in all clinical situations. eGFR's persistently <90 mL/min signify possible Chronic Kidney Disease.    Anion gap 5 5 - 15    Imaging / Studies: Ct Abdomen Pelvis W Contrast  10/20/2014   CLINICAL DATA:  Abdominal pain.  EXAM: CT ABDOMEN AND PELVIS WITH CONTRAST  TECHNIQUE: Multidetector CT imaging of the abdomen and pelvis was performed using the standard protocol following bolus administration of intravenous contrast.  CONTRAST:  173mL OMNIPAQUE IOHEXOL 300 MG/ML  SOLN  COMPARISON:  08/27/2014  FINDINGS: Tortuous thoracic aorta.  Atelectasis in the lung bases.  The gallbladder is moderately distended. Increased density in the gallbladder suggests sludge. No discrete stones are visualized. There is mild gallbladder wall thickening and pericholecystic edema. Small amount of free fluid in the upper abdomen around the liver and gallbladder, likely reactive. Changes are nonspecific but suggest possible cholecystitis. No bile duct dilatation. The liver, spleen, pancreas, gallbladder, kidneys, abdominal aorta, inferior vena cava, and retroperitoneal lymph nodes are unremarkable. Stomach and small bowel appear normal. Contrast material flows through to the colon without evidence of bowel obstruction. Diffusely stool-filled colon without wall thickening or abnormal distention. No free air or free fluid in the abdomen.  Pelvis: The appendix is not identified. Bladder wall is not thickened. Calcifications layering along the dependent portion of the bladder probably representing bladder stones. Bladder wall calcification is not excluded. Uterus is surgically absent. No pelvic mass or lymphadenopathy. No free or loculated pelvic fluid collections. Degenerative changes in the spine and hips. No destructive bone lesions.  IMPRESSION: Distended gallbladder with probable sludge. Gallbladder wall thickening and edema. Free fluid in the right  upper quadrant. Findings are suspicious for cholecystitis. Calcifications in the bladder probably represent bladder stones. Bladder wall calcification not excluded.   Electronically Signed   By: Lucienne Capers M.D.   On: 10/20/2014 21:04   Dg Abd Acute W/chest  10/20/2014   CLINICAL DATA:  Abdominal pain. Pain started last night. Pain relieved with bowel movement and numerous department  EXAM: ACUTE ABDOMEN SERIES (ABDOMEN 2 VIEW & CHEST 1 VIEW)  COMPARISON:  CT 08/27/2014  FINDINGS: No dilated loops of large or small bowel. There is a moderate volume stool in the transverse colon. Large volume stool in the rectum measuring approximate 7 cm diameter. There is no intraperitoneal free air on the lateral projection. No free air beneath hemidiaphragms. Heart is enlarged.  IMPRESSION: 1. No evidence bowel obstruction or intraperitoneal free air. 2. Moderate to large volume stool in the colon and rectum.   Electronically Signed   By: Suzy Bouchard M.D.   On: 10/20/2014 17:54   US Abdomen Limited Ruq  10/21/2014   CLINICAL DATA:  Abdominal pain. Changes of acute cholecystitis on CT abdomen and pelvis.  EXAM: US ABDOMEN LIMITED - RIGHT UPPER QUADRANT  COMPARISON:  CT abdomen and pelvis 10/20/2014.  FINDINGS: Gallbladder:  Gallbladder is distended with wall thickening and pericholecystic edema. Gallbladder wall measures up 8.4 mm thickness. Sludge and stones are demonstrated in the gallbladder. Murphy's sign is negative. Appearance is nonspecific but consistent with acute cholecystitis in the appropriate clinical setting.  Common bile duct:  Diameter: 4.7 mm, normal  Liver:  No focal lesion identified. Within normal limits in parenchymal echogenicity.  IMPRESSION: Gallbladder distention and wall thickening with pericholecystic edema. Stones and sludge. Murphy's sign negative. Appearance is nonspecific but consistent with acute cholecystitis in the appropriate clinical setting.   Electronically Signed   By: Lucienne Capers M.D.   On: 10/21/2014 00:16    Medications / Allergies:  Scheduled Meds: . sodium chloride   Intravenous STAT  . insulin aspart  0-9 Units Subcutaneous Q4H  . piperacillin-tazobactam (ZOSYN)  IV  3.375 g Intravenous Q8H   Continuous Infusions: . 0.9 % NaCl with KCl 20 mEq / L 75 mL/hr at 10/21/14 0139   PRN Meds:.acetaminophen **OR** acetaminophen, hydrALAZINE, morphine injection, ondansetron **OR** ondansetron (ZOFRAN) IV  Antibiotics: Anti-infectives    Start     Dose/Rate Route Frequency Ordered Stop   10/21/14 0200  piperacillin-tazobactam (ZOSYN) IVPB 3.375 g     3.375 g12.5 mL/hr over 240 Minutes Intravenous Every 8 hours 10/21/14 0043          Assessment/Plan Abnormal gallbladder transaminitis  -proceed with a HIDA scan.  If positive, will discuss surgery versus perc chole drain.  If HIDA is negative, then recommend GI consult for further work up given increase in LFTs with T bili of 2.8 which was normal earlier.   -NPO for now -IVF, pain control, monitor electrolytes -zosyn D#1 -repeat CMP in AM -will follow  Erby Pian, [facility] Surgery Pager [phone](7A-4:30P) For consults and floor pages call [phone](7A-4:30P)  10/21/2014 10:45 AM

## 2014-10-21 NOTE — Progress Notes (Signed)
[facility] PROGRESS NOTE   Hannah Lamb:[id number] DOB: [birth year] DOA: 10/20/2014 PCP: Lonia Blood, MD  HPI/Subjective: Patient feels much better, no nausea or vomiting.  Assessment/Plan: Principal Problem:   Cholecystitis Active Problems:   Hypertension   Diabetes mellitus type 2, controlled   Hypokalemia   Acute cholecystitis  Acute cholecystitis Presented with nausea, vomiting and RUQ abdominal pain. CT scan of abdomen and pelvis was done and showed sludge with pericholecystic fluids. General surgery recommended HIDA scan. Continue Zosyn and follow-up after the CT scan.  Elevated LFTs  Probably from #1 - follow LFTs. Follow sonogram.  Diabetes mellitus type 2 uncontrolled  Since patient is nothing by mouth at this time I have placed patient on sliding scale coverage.  Closely follow CBG trends.  Hypertension  Since patient is nothing by mouth we'll keep patient on when necessary IV hydralazine for now.  Hypokalemia  Still has hypokalemia after the initial replacement. Potassium 3.2, continue potassium containing IV fluids. Check BMP in a.m.  Code Status: Full Code Family Communication: Plan discussed with the patient. Disposition Plan: Remains inpatient Diet: Diet NPO time specified  Consultants:  Gen Surgery  Procedures:  None  Antibiotics:  Zosyn   Objective: Filed Vitals:   10/21/14 1331  BP: 155/68  Pulse: 122  Temp: 98.2 F (36.8 C)  Resp: 19    Intake/Output Summary (Last 24 hours) at 10/21/14 1642 Last data filed at 10/21/14 1514  Gross per 24 hour  Intake 1118.75 ml  Output      0 ml  Net 1118.75 ml   Filed Weights   10/21/14 0100  Weight: 74.844 kg (165 lb)    Exam: General: Alert and awake, oriented x3, not in any acute distress. HEENT: anicteric sclera, pupils reactive to light and accommodation, EOMI CVS: S1-S2 clear, no murmur rubs or gallops Chest: clear to auscultation bilaterally, no wheezing, rales or  rhonchi Abdomen: soft nontender, nondistended, normal bowel sounds, no organomegaly Extremities: no cyanosis, clubbing or edema noted bilaterally Neuro: Cranial nerves II-XII intact, no focal neurological deficits  Data Reviewed: Basic Metabolic Panel:  Recent Labs Lab 10/20/14 1634 10/21/14 0148  NA 138 140  K 2.9* 3.2*  CL 101 105  CO2 30 30  GLUCOSE 247* 148*  BUN 12 7  CREATININE 0.53 0.42*  CALCIUM 8.8 8.7  MG  --  1.5   Liver Function Tests:  Recent Labs Lab 10/20/14 1634 10/21/14 0148  AST 585* 836*  ALT 305* 589*  ALKPHOS 70 93  BILITOT 1.2 2.5*  PROT 6.7 6.9  ALBUMIN 3.0* 3.1*    Recent Labs Lab 10/20/14 1634  LIPASE 26   No results for input(s): AMMONIA in the last 168 hours. CBC:  Recent Labs Lab 10/20/14 1634 10/21/14 0148  WBC 6.8 5.0  NEUTROABS 5.7 3.2  HGB 12.9 13.2  HCT 37.0 38.4  MCV 81.9 81.9  PLT 183 188   Cardiac Enzymes: No results for input(s): CKTOTAL, CKMB, CKMBINDEX, TROPONINI in the last 168 hours. BNP (last 3 results) No results for input(s): BNP in the last 8760 hours.  ProBNP (last 3 results) No results for input(s): PROBNP in the last 8760 hours.  CBG:  Recent Labs Lab 10/21/14 1149  GLUCAP 147*    Micro No results found for this or any previous visit (from the past 240 hour(s)).   Studies: Ct Abdomen Pelvis W Contrast  10/20/2014   CLINICAL DATA:  Abdominal pain.  EXAM: CT ABDOMEN AND PELVIS WITH CONTRAST  TECHNIQUE:  Multidetector CT imaging of the abdomen and pelvis was performed using the standard protocol following bolus administration of intravenous contrast.  CONTRAST:  OMNIPAQUE IOHEXOL 300 MG/ML  SOLN  COMPARISON:  08/27/2014  FINDINGS: Tortuous thoracic aorta.  Atelectasis in the lung bases.  The gallbladder is moderately distended. Increased density in the gallbladder suggests sludge. No discrete stones are visualized. There is mild gallbladder wall thickening and pericholecystic edema. Small  amount of free fluid in the upper abdomen around the liver and gallbladder, likely reactive. Changes are nonspecific but suggest possible cholecystitis. No bile duct dilatation. The liver, spleen, pancreas, gallbladder, kidneys, abdominal aorta, inferior vena cava, and retroperitoneal lymph nodes are unremarkable. Stomach and small bowel appear normal. Contrast material flows through to the colon without evidence of bowel obstruction. Diffusely stool-filled colon without wall thickening or abnormal distention. No free air or free fluid in the abdomen.  Pelvis: The appendix is not identified. Bladder wall is not thickened. Calcifications layering along the dependent portion of the bladder probably representing bladder stones. Bladder wall calcification is not excluded. Uterus is surgically absent. No pelvic mass or lymphadenopathy. No free or loculated pelvic fluid collections. Degenerative changes in the spine and hips. No destructive bone lesions.  IMPRESSION: Distended gallbladder with probable sludge. Gallbladder wall thickening and edema. Free fluid in the right upper quadrant. Findings are suspicious for cholecystitis. Calcifications in the bladder probably represent bladder stones. Bladder wall calcification not excluded.   Electronically Signed   By: Burman Nieves M.D.   On: 10/20/2014 21:04   Dg Abd Acute W/chest  10/20/2014   CLINICAL DATA:  Abdominal pain. Pain started last night. Pain relieved with bowel movement and numerous department  EXAM: ACUTE ABDOMEN SERIES (ABDOMEN 2 VIEW & CHEST 1 VIEW)  COMPARISON:  CT 08/27/2014  FINDINGS: No dilated loops of large or small bowel. There is a moderate volume stool in the transverse colon. Large volume stool in the rectum measuring approximate 7 cm diameter. There is no intraperitoneal free air on the lateral projection. No free air beneath hemidiaphragms. Heart is enlarged.  IMPRESSION: 1. No evidence bowel obstruction or intraperitoneal free air. 2.  Moderate to large volume stool in the colon and rectum.   Electronically Signed   By: Genevive Bi M.D.   On: 10/20/2014 17:54   US Abdomen Limited Ruq  10/21/2014   CLINICAL DATA:  Abdominal pain. Changes of acute cholecystitis on CT abdomen and pelvis.  EXAM: US ABDOMEN LIMITED - RIGHT UPPER QUADRANT  COMPARISON:  CT abdomen and pelvis 10/20/2014.  FINDINGS: Gallbladder:  Gallbladder is distended with wall thickening and pericholecystic edema. Gallbladder wall measures up 8.4 mm thickness. Sludge and stones are demonstrated in the gallbladder. Murphy's sign is negative. Appearance is nonspecific but consistent with acute cholecystitis in the appropriate clinical setting.  Common bile duct:  Diameter: 4.7 mm, normal  Liver:  No focal lesion identified. Within normal limits in parenchymal echogenicity.  IMPRESSION: Gallbladder distention and wall thickening with pericholecystic edema. Stones and sludge. Murphy's sign negative. Appearance is nonspecific but consistent with acute cholecystitis in the appropriate clinical setting.   Electronically Signed   By: Burman Nieves M.D.   On: 10/21/2014 00:16    Scheduled Meds: . sodium chloride   Intravenous STAT  . insulin aspart  0-9 Units Subcutaneous Q4H  . piperacillin-tazobactam (ZOSYN)  IV  3.375 g Intravenous Q8H  . sincalide      . sincalide  0.02 mcg/kg Intravenous Once   Continuous Infusions: .  0.9 % NaCl with KCl 20 mEq / L 75 mL/hr at 10/21/14 0139       Time spent: 35 minutes    [facility]  [facility] Pager [phone] If 7PM-7AM, please contact night-coverage at [email or web address], password [facility] 10/21/2014, 4:42 PM  LOS: 1 day

## 2014-10-21 NOTE — Progress Notes (Signed)
Pt arrived to unit alert and oriented x4. Oriented to room, unit, and staff.  Bed in lowest position and call bell is within reach. Will continue to monitor. 

## 2014-10-22 ENCOUNTER — Inpatient Hospital Stay (HOSPITAL_COMMUNITY): Payer: Medicare Other

## 2014-10-22 LAB — COMPREHENSIVE METABOLIC PANEL
ALT: 417 U/L — AB (ref 0–35)
ANION GAP: 11 (ref 5–15)
AST: 269 U/L — ABNORMAL HIGH (ref 0–37)
Albumin: 3.2 g/dL — ABNORMAL LOW (ref 3.5–5.2)
Alkaline Phosphatase: 108 U/L (ref 39–117)
CO2: 23 mmol/L (ref 19–32)
Calcium: 9 mg/dL (ref 8.4–10.5)
Chloride: 104 mmol/L (ref 96–112)
Creatinine, Ser: 0.59 mg/dL (ref 0.50–1.10)
GFR calc non Af Amer: 88 mL/min — ABNORMAL LOW (ref >90)
GLUCOSE: 183 mg/dL — AB (ref 70–99)
Potassium: 4.1 mmol/L (ref 3.5–5.1)
SODIUM: 138 mmol/L (ref 135–145)
TOTAL PROTEIN: 6.7 g/dL (ref 6.0–8.3)
Total Bilirubin: 1.7 mg/dL — ABNORMAL HIGH (ref 0.3–1.2)

## 2014-10-22 LAB — CBC
HCT: 39.8 % (ref 36.0–46.0)
Hemoglobin: 13.5 g/dL (ref 12.0–15.0)
MCH: 28.6 pg (ref 26.0–34.0)
MCHC: 33.9 g/dL (ref 30.0–36.0)
MCV: 84.3 fL (ref 78.0–100.0)
Platelets: 184 10*3/uL (ref 150–400)
RBC: 4.72 MIL/uL (ref 3.87–5.11)
RDW: 14.6 % (ref 11.5–15.5)
WBC: 4.8 10*3/uL (ref 4.0–10.5)

## 2014-10-22 LAB — GLUCOSE, CAPILLARY
GLUCOSE-CAPILLARY: 127 mg/dL — AB (ref 70–99)
GLUCOSE-CAPILLARY: 128 mg/dL — AB (ref 70–99)
GLUCOSE-CAPILLARY: 156 mg/dL — AB (ref 70–99)
Glucose-Capillary: 123 mg/dL — ABNORMAL HIGH (ref 70–99)
Glucose-Capillary: 140 mg/dL — ABNORMAL HIGH (ref 70–99)
Glucose-Capillary: 178 mg/dL — ABNORMAL HIGH (ref 70–99)
Glucose-Capillary: 137 mg/dL — ABNORMAL HIGH (ref 70–99)
Glucose-Capillary: 130 mg/dL — ABNORMAL HIGH (ref 70–99)
Glucose-Capillary: 146 mg/dL — ABNORMAL HIGH (ref 70–99)

## 2014-10-22 MED ORDER — GADOBENATE DIMEGLUMINE 529 MG/ML IV SOLN
20.0000 mL | Freq: Once | INTRAVENOUS | Status: AC
  Administered 2014-10-22: 16 mL via INTRAVENOUS

## 2014-10-22 MED ORDER — AMLODIPINE BESYLATE 2.5 MG PO TABS
2.5000 mg | ORAL_TABLET | Freq: Every day | ORAL | Status: DC
  Administered 2014-10-22 – 2014-10-23 (×2): 2.5 mg via ORAL
  Filled 2014-10-22 (×2): qty 1

## 2014-10-22 MED ORDER — INSULIN ASPART 100 UNIT/ML ~~LOC~~ SOLN
0.0000 [IU] | Freq: Three times a day (TID) | SUBCUTANEOUS | Status: DC
Start: 2014-10-23 — End: 2014-10-25
  Administered 2014-10-23 – 2014-10-24 (×2): 1 [IU] via SUBCUTANEOUS
  Administered 2014-10-24 (×2): 2 [IU] via SUBCUTANEOUS
  Administered 2014-10-25: 3 [IU] via SUBCUTANEOUS
  Administered 2014-10-25: 2 [IU] via SUBCUTANEOUS

## 2014-10-22 NOTE — Progress Notes (Signed)
[facility] PROGRESS NOTE   Hannah Lamb ZOX:[id number] DOB: 1941/06/15 DOA: 10/20/2014 PCP: Lonia Blood, MD  HPI/Subjective: Patient continued to feel better.  Liver function tests from this morning pending.  Per daughter, patient had some slurring of speech this morning, however this was not observed by the nursing staff.  Assessment/Plan: Principal Problem:   Cholecystitis Active Problems:   Hypertension   Diabetes mellitus type 2, controlled   Hypokalemia   Acute cholecystitis  Acute cholecystitis -Presented with nausea, vomiting and RUQ abdominal pain. -CT scan of abdomen and pelvis was done and showed sludge with pericholecystic fluids. -General surgery following, MRCP ordered. -HIDA scan on 10/21/2014 showed no biliary obstruction, biliary dyskinesis, relatively poor clearance of activity from the liver as can be seen with hepatocellular disease. -Right upper quadrant ultrasound on 10/21/2014 showed gallbladder distention with wall thickening with pericholecystic edema. -Continue Zosyn.  Elevated LFTs  -Gen. surgery requested consultation with GI, [facility] consulted.  Slurred speech -Patient has no focal neurologic deficits on exam.  Head CT is negative for any acute findings. -Serial neuro checks for the next 24 hours every 4 hours, if any changes consider MRI of the brain for further evaluation.  Diabetes mellitus type 2 uncontrolled  -Since patient is NPO, sliding scale coverage.  -Closely follow CBG trends.  Hypertension  -We start amlodipine 2.5 mg daily.  -PRN hydralazine, patient had a significant blood pressure drop after being given hydralazine continue to monitor carefully.   Hypokalemia  -Replace as needed.   Code Status: Full Code Family Communication: Plan discussed with the patient. Disposition Plan: Remains inpatient Diet: Diet NPO time specified  Consultants:  Gen  Surgery  GI  Procedures:  None  Antibiotics:  Zosyn   Objective: Filed Vitals:   10/22/14 0705  BP: 112/62  Pulse:   Temp:   Resp:     Intake/Output Summary (Last 24 hours) at 10/22/14 1017 Last data filed at 10/22/14 0900  Gross per 24 hour  Intake 1118.75 ml  Output      0 ml  Net 1118.75 ml   Filed Weights   10/21/14 0100  Weight: 74.844 kg (165 lb)    Exam:  Physical Exam: General: Awake, Oriented, No acute distress. HEENT: EOMI. Neck: Supple CV: S1 and S2 Lungs: Clear to ascultation bilaterally Abdomen: Soft, Nontender, Nondistended, +bowel sounds. Ext: Good pulses. Trace edema. Neuro: CN II-XII, 5/5 motor strength in upper and lower extremities with intact sensation bilaterally, No slurring of speech noted.  Data Reviewed: Basic Metabolic Panel:  Recent Labs Lab 10/20/14 1634 10/21/14 0148  NA 138 140  K 2.9* 3.2*  CL 101 105  CO2 30 30  GLUCOSE 247* 148*  BUN 12 7  CREATININE 0.53 0.42*  CALCIUM 8.8 8.7  MG  --  1.5   Liver Function Tests:  Recent Labs Lab 10/20/14 1634 10/21/14 0148  AST 585* 836*  ALT 305* 589*  ALKPHOS 70 93  BILITOT 1.2 2.5*  PROT 6.7 6.9  ALBUMIN 3.0* 3.1*    Recent Labs Lab 10/20/14 1634  LIPASE 26   No results for input(s): AMMONIA in the last 168 hours. CBC:  Recent Labs Lab 10/20/14 1634 10/21/14 0148 10/22/14 0849  WBC 6.8 5.0 4.8  NEUTROABS 5.7 3.2  --   HGB 12.9 13.2 13.5  HCT 37.0 38.4 39.8  MCV 81.9 81.9 84.3  PLT 183 188 184   Cardiac Enzymes: No results for input(s): CKTOTAL, CKMB, CKMBINDEX, TROPONINI in the last 168 hours. BNP (  last 3 results) No results for input(s): BNP in the last 8760 hours.  ProBNP (last 3 results) No results for input(s): PROBNP in the last 8760 hours.  CBG:  Recent Labs Lab 10/21/14 1149 10/21/14 2010 10/22/14 0011 10/22/14 0612 10/22/14 0858  GLUCAP 147* 109* 128* 140* 178*    Micro No results found for this or any previous visit  (from the past 240 hour(s)).   Studies: Ct Head Wo Contrast  10/22/2014   CLINICAL DATA:  Acute CVA, slurred speech.  Elevated blood pressure.  EXAM: CT HEAD WITHOUT CONTRAST  TECHNIQUE: Contiguous axial images were obtained from the base of the skull through the vertex without intravenous contrast.  COMPARISON:  None.  FINDINGS: Chronic microvascular disease throughout the deep white matter. Old lacunar infarcts in the left thalamus and right basal ganglia, stable. No acute infarction, hemorrhage or hydrocephalus. Old lacunar infarct in the right brainstem. No mass lesion or mass-effect. No extra-axial fluid collection. No acute calvarial abnormality.  Visualized paranasal sinuses and mastoids clear. Orbital soft tissues unremarkable.  IMPRESSION: Chronic microvascular disease. 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 No acute intracranial abnormality.   Electronically Signed   By: Charlett Nose M.D.   On: 10/22/2014 08:21   Nm Hepatobiliary Including Gb  10/21/2014   CLINICAL DATA:  Right upper quadrant pain.  EXAM: NUCLEAR MEDICINE HEPATOBILIARY IMAGING WITH GALLBLADDER EF  TECHNIQUE: Sequential images of the abdomen were obtained out to 60 minutes following intravenous administration of radiopharmaceutical. After slow intravenous infusion of 1.5 micrograms Cholecystokinin, gallbladder ejection fraction was determined.  RADIOPHARMACEUTICALS:  5.0 Millicurie Tc-7m Choletec  COMPARISON:  None  FINDINGS: There is immediate homogeneous uptake of radiotracer in the liver. Filling of the gallbladder begins at 45 minutes. Radiotracer uptake is present in the small bowel at 43 minutes.  At the end of 1-hour, there is relatively poor clearance of activity from the liver.  When gallbladder filling was complete, the patient was given an IV infusion of CCK. The gallbladder ejection fraction measures 1.3%. At 45 min, normal ejection fraction is greater than 40%.  The patient did not experience symptoms during CCK infusion.   IMPRESSION: 1. No biliary obstruction. 2. Biliary dyskinesia. 3. Relatively poor clearance of activity from the liver as can be seen with hepatocellular disease.   Electronically Signed   By: Elige Ko   On: 10/21/2014 18:23   Ct Abdomen Pelvis W Contrast  10/20/2014   CLINICAL DATA:  Abdominal pain.  EXAM: CT ABDOMEN AND PELVIS WITH CONTRAST  TECHNIQUE: Multidetector CT imaging of the abdomen and pelvis was performed using the standard protocol following bolus administration of intravenous contrast.  CONTRAST:  OMNIPAQUE IOHEXOL 300 MG/ML  SOLN  COMPARISON:  08/27/2014  FINDINGS: Tortuous thoracic aorta.  Atelectasis in the lung bases.  The gallbladder is moderately distended. Increased density in the gallbladder suggests sludge. No discrete stones are visualized. There is mild gallbladder wall thickening and pericholecystic edema. Small amount of free fluid in the upper abdomen around the liver and gallbladder, likely reactive. Changes are nonspecific but suggest possible cholecystitis. No bile duct dilatation. The liver, spleen, pancreas, gallbladder, kidneys, abdominal aorta, inferior vena cava, and retroperitoneal lymph nodes are unremarkable. Stomach and small bowel appear normal. Contrast material flows through to the colon without evidence of bowel obstruction. Diffusely stool-filled colon without wall thickening or abnormal distention. No free air or free fluid in the abdomen.  Pelvis: The appendix is not identified. Bladder wall is not thickened. Calcifications layering along the  dependent portion of the bladder probably representing bladder stones. Bladder wall calcification is not excluded. Uterus is surgically absent. No pelvic mass or lymphadenopathy. No free or loculated pelvic fluid collections. Degenerative changes in the spine and hips. No destructive bone lesions.  IMPRESSION: Distended gallbladder with probable sludge. Gallbladder wall thickening and edema. Free fluid in the right  upper quadrant. Findings are suspicious for cholecystitis. Calcifications in the bladder probably represent bladder stones. Bladder wall calcification not excluded.   Electronically Signed   By: Burman Nieves M.D.   On: 10/20/2014 21:04   Dg Abd Acute W/chest  10/20/2014   CLINICAL DATA:  Abdominal pain. Pain started last night. Pain relieved with bowel movement and numerous department  EXAM: ACUTE ABDOMEN SERIES (ABDOMEN 2 VIEW & CHEST 1 VIEW)  COMPARISON:  CT 08/27/2014  FINDINGS: No dilated loops of large or small bowel. There is a moderate volume stool in the transverse colon. Large volume stool in the rectum measuring approximate 7 cm diameter. There is no intraperitoneal free air on the lateral projection. No free air beneath hemidiaphragms. Heart is enlarged.  IMPRESSION: 1. No evidence bowel obstruction or intraperitoneal free air. 2. Moderate to large volume stool in the colon and rectum.   Electronically Signed   By: Genevive Bi M.D.   On: 10/20/2014 17:54   US Abdomen Limited Ruq  10/21/2014   CLINICAL DATA:  Abdominal pain. Changes of acute cholecystitis on CT abdomen and pelvis.  EXAM: US ABDOMEN LIMITED - RIGHT UPPER QUADRANT  COMPARISON:  CT abdomen and pelvis 10/20/2014.  FINDINGS: Gallbladder:  Gallbladder is distended with wall thickening and pericholecystic edema. Gallbladder wall measures up 8.4 mm thickness. Sludge and stones are demonstrated in the gallbladder. Murphy's sign is negative. Appearance is nonspecific but consistent with acute cholecystitis in the appropriate clinical setting.  Common bile duct:  Diameter: 4.7 mm, normal  Liver:  No focal lesion identified. Within normal limits in parenchymal echogenicity.  IMPRESSION: Gallbladder distention and wall thickening with pericholecystic edema. Stones and sludge. Murphy's sign negative. Appearance is nonspecific but consistent with acute cholecystitis in the appropriate clinical setting.   Electronically Signed   By: Burman Nieves M.D.   On: 10/21/2014 00:16    Scheduled Meds: . insulin aspart  0-9 Units Subcutaneous Q4H  . piperacillin-tazobactam (ZOSYN)  IV  3.375 g Intravenous Q8H   Continuous Infusions: . 0.9 % NaCl with KCl 20 mEq / L 75 mL/hr at 10/22/14 0526    Nicci Vaughan A, MD [facility] 10/22/2014, 10:17 AM  LOS: 2 days

## 2014-10-22 NOTE — Evaluation (Signed)
Physical Therapy Evaluation Patient Details Name: Hannah Lamb MRN: [id number] DOB: 14-Mar-1941 Today's Date: 10/22/2014   History of Present Illness  Hannah Lamb is a 74 y.o. female with history of multiple sclerosis with lower extremity weakness, diabetes mellitus, hypertension presents to the ER because of persistent nausea vomiting. Patient also has had initially some epigastric discomfort. Patient's symptoms started today morning. In the ER CT abdomen and pelvis shows features concerning for acute cholecystitis. Patient's LFTs were elevated. On-call surgeon Dr. Janee Morn was consulted and at this time patient has been admitted for further management of possible cholecystitis.   Clinical Impression  Pt admitted with/for s/s of cholecystitis.  Pt currently limited functionally due to the problems listed below.  (see problems list.)  Pt will benefit from PT to maximize function and safety to be able to get home safely with available assist of family.     Follow Up Recommendations Supervision/Assistance - 24 hour;Home health PT;Other (comment) (if family will allow continuation of mobility)    Equipment Recommendations  Other (comment) (TBA)    Recommendations for Other Services       Precautions / Restrictions        Mobility  Bed Mobility               General bed mobility comments: pt asked not to try sitting EOB;  Daughter came in on "tail end" and stated she did not want further assessment/mobility until it was determined what was the cause of the pain.  I attempted to suggest possiblitiy of joint changes from pt unable to let caregivers help her move.  Daughter just asked therapist to stop for the present time.  Transfers                 General transfer comment: not tested  Ambulation/Gait                Stairs            Wheelchair Mobility    Modified Rankin (Stroke Patients Only)       Balance                                             Pertinent Vitals/Pain Pain Assessment: Faces Faces Pain Scale: Hurts whole lot Pain Location: knees/LE's Pain Descriptors / Indicators: Grimacing Pain Intervention(s): Limited activity within patient's tolerance;Monitored during session    Home Living Family/patient expects to be discharged to:: Private residence Living Arrangements: Children (and grand son) Available Help at Discharge: Family Type of Home: Apartment Home Access: Level entry     Home Layout: One level Home Equipment: Hospital bed;Wheelchair - power      Prior Function Level of Independence: Needs assistance         Comments: per pt, daugter and grandson help her for everything, bathing, toileting, etc and she has basically been bedbound for a month and a half or more.     Hand Dominance        Extremity/Trunk Assessment   Upper Extremity Assessment: Defer to OT evaluation           Lower Extremity Assessment: Generalized weakness;RLE deficits/detail;LLE deficits/detail RLE Deficits / Details: Attempted to complete basic ROM/MMT but pt unable to tolerate minor knee and hip ROM  R Knee to 60 flexion LLE Deficits / Details: L knee to 30* flexion  Communication   Communication: No difficulties  Cognition Arousal/Alertness: Awake/alert Behavior During Therapy: WFL for tasks assessed/performed Overall Cognitive Status: No family/caregiver present to determine baseline cognitive functioning                      General Comments      Exercises        Assessment/Plan    PT Assessment Patient needs continued PT services  PT Diagnosis Acute pain   PT Problem List Decreased strength;Decreased range of motion;Decreased activity tolerance;Decreased mobility;Pain  PT Treatment Interventions Functional mobility training;Therapeutic activities;Patient/family education;Therapeutic exercise   PT Goals (Current goals can be found in the Care Plan section) Acute Rehab  PT Goals Patient Stated Goal: no goal PT Goal Formulation: Patient unable to participate in goal setting Time For Goal Achievement: 10/29/14 Potential to Achieve Goals: Fair    Frequency Min 2X/week   Barriers to discharge        Co-evaluation               End of Session   Activity Tolerance: Other (comment) (pt/daughter asked therapist to stop due to pain) Patient left: in bed;with family/visitor present;with call bell/phone within reach Nurse Communication: Other (comment) (reported limited eval to RN)         Time: 1731-1750 PT Time Calculation (min) (ACUTE ONLY): 19 min   Charges:   PT Evaluation $Initial PT Evaluation Tier I: 1 Procedure     PT G Codes:        Tonji Elliff, Eliseo Gum 10/22/2014, 6:11 PM  10/22/2014  Bushnell Bing, PT [phone]  (pager)

## 2014-10-22 NOTE — Progress Notes (Signed)
Please see dictated consultation note from earlier today.  Additional information which I did not dictate, but which is relevant to the patient's case, is as follows:  1. No history of alcohol exposure 2. No family history of liver disease 3. No family history of gallbladder disease 4. No medications prior to admission that would be classically hepatotoxic  Hannah Lamb, M.D. [phone]

## 2014-10-22 NOTE — Consult Note (Signed)
Referring Provider: Dr. Andreas Blower Primary Care Physician:  Lonia Blood, MD Primary Gastroenterologist: None (unassigned)  Reason for Consultation:  Elevated liver chemistries  HPI: Hannah Lamb is a 74 y.o. female admitted to the hospital couple days ago with a sense of feeling "sick" and was found on both ultrasound and CT scanning to have evidence of cholecystitis, with gallbladder sludge and stones, and gallbladder wall thickening and pericholecystic fluid. Common duct size was normal and there were no intraparenchymal hepatic filling defects, but the patient's liver chemistries have been elevated, with transaminases in the 500 range, although alkaline phosphatase and bilirubin have been essentially normal. Lipase normal.   Of note, the patient had completely normal liver chemistries 2-1/2 years ago.   A hepatobiliary scan showed sluggish hepatic activity consistent with hepatocellular disease, but entry of the radionuclide into the small bowel, implying the absence of significant common duct obstruction.  Since coming into the hospital, the patient has remained relatively free of frank abdominal pain, but has had intermittent nausea. At this time, she feels hungry.   She had an MRCP today that was negative for evidence of choledocholithiasis.  Incidentally, the patient states she had a screening colonoscopy "many years ago" in [place] but she does not have a Customer service manager.   Past Medical History  Diagnosis Date  . Hypertension   . MS (multiple sclerosis)   . Diabetes mellitus   . Goiter   . Hyperlipidemia   . Myocardial infarction   . Anxiety   . Stroke   . Syncope     recurrent  . Bilateral chronic knee pain     Past Surgical History  Procedure Laterality Date  . Abdominal hysterectomy      Prior to Admission medications   Medication Sig Start Date End Date Taking? Authorizing Provider  amLODipine (NORVASC) 10 MG tablet Take 1 tablet (10 mg total)  by mouth daily. 01/08/13  Yes Laveda Norman, MD  HUMULIN 70/30 (70-30) 100 UNIT/ML injection Inject 15 Units into the skin daily. 03/17/14  Yes Historical Provider, MD  irbesartan (AVAPRO) 300 MG tablet Take 1 tablet (300 mg total) by mouth daily. 01/08/13  Yes Laveda Norman, MD  potassium chloride (K-DUR) 10 MEQ tablet Take 10 mEq by mouth daily.   Yes Historical Provider, MD  traMADol (ULTRAM) 50 MG tablet Take 1 tablet (50 mg total) by mouth every 6 (six) hours as needed. 08/27/14   Suzi Roots, MD    Current Facility-Administered Medications  Medication Dose Route Frequency Provider Last Rate Last Dose  . 0.9 % NaCl with KCl 20 mEq/ L  infusion   Intravenous Continuous Eduard Clos, MD 75 mL/hr at 10/22/14 0526    . acetaminophen (TYLENOL) tablet 650 mg  650 mg Oral Q6H PRN Eduard Clos, MD       Or  . acetaminophen (TYLENOL) suppository 650 mg  650 mg Rectal Q6H PRN Eduard Clos, MD      . amLODipine (NORVASC) tablet 2.5 mg  2.5 mg Oral Daily Cristal Ford, MD   2.5 mg at 10/22/14 1521  . hydrALAZINE (APRESOLINE) injection 10 mg  10 mg Intravenous Q4H PRN Eduard Clos, MD   10 mg at 10/22/14 0607  . insulin aspart (novoLOG) injection 0-9 Units  0-9 Units Subcutaneous Q4H Eduard Clos, MD   2 Units at 10/22/14 0901  . morphine 2 MG/ML injection 1 mg  1 mg Intravenous Q4H PRN Ashok Norris, NP  1 mg at 10/21/14 0139  . ondansetron (ZOFRAN) tablet 4 mg  4 mg Oral Q6H PRN Eduard Clos, MD       Or  . ondansetron [facility]) injection 4 mg  4 mg Intravenous Q6H PRN Eduard Clos, MD   4 mg at 10/21/14 0149  . piperacillin-tazobactam (ZOSYN) IVPB 3.375 g  3.375 g Intravenous Q8H Eduard Clos, MD   3.375 g at 10/22/14 1610    Allergies as of 10/20/2014 - Review Complete 10/20/2014  Allergen Reaction Noted  . Sulfa drugs cross reactors Other (See Comments) 02/15/2012  . Lipitor [atorvastatin] Other (See Comments) 02/18/2012    Family History   Problem Relation Age of Onset  . Diabetes Father     History   Social History  . Marital Status: Widowed    Spouse Name: N/A  . Number of Children: N/A  . Years of Education: N/A   Occupational History  . Not on file.   Social History Main Topics  . Smoking status: Never Smoker   . Smokeless tobacco: Not on file  . Alcohol Use: No  . Drug Use: No  . Sexual Activity: No   Other Topics Concern  . Not on file   Social History Narrative   Lives with daughter.    Review of Systems: The patient has a tendency toward constipation, for which she uses prune juice on a frequent basis. Apart from that, no ongoing digestive complaints such as anorexia, involuntary weight loss, dysphagia, or reflux symptomatology, chronic nausea or recurring abdominal pain. The patient did have an episode of unexplained vomiting about a month ago. No chest pain, no chronic dyspnea. She does, however, have a history of fainting, attributed to "vasovagal" issues. Has had some "mini strokes" in the past.   Phy sical Exam: Vital signs in last 24 hours: Temp:  [98.2 F (36.8 C)-98.4 F (36.9 C)] 98.3 F (36.8 C) (02/10 1457) Pulse Rate:  [64-72] 72 (02/10 1457) Resp:  [16-20] 18 (02/10 1457) BP: (112-220)/(56-84) 171/74 mmHg (02/10 1457) SpO2:  [98 %-100 %] 100 % (02/10 1457) Last BM Date: 10/21/14 General:   Alert,  Well-developed, well-nourished, pleasant and cooperative in NAD Head:  Normocephalic and atraumatic. Eyes:  Sclera clear, no icterus.   Conjunctiva pale. Lungs:  Clear throughout to auscultation.   No wheezes, crackles, or rhonchi. No evident respiratory distress. Heart:   Regular rate and rhythm; no murmurs, clicks, rubs,  or gallops. Abdomen:  Soft, nontender, nontympanitic, and nondistended. No masses, hepatosplenomegaly or ventral hernias noted.  Msk:   Symmetrical without gross deformities. Extremities:   Without edema. Neurologic:  Alert and coherent;  grossly normal  neurologically. Skin:  Intact without significant lesions or rashes. Psych:   Alert and cooperative. Normal mood and affect.  Intake/Output from previous day: 02/09 0701 - 02/10 0700 In: 1118.8 [I.V.:1018.8; IV Piggyback:100] Out: -  Intake/Output this shift:    Lab Results:  Recent Labs  10/20/14 1634 10/21/14 0148 10/22/14 0849  WBC 6.8 5.0 4.8  HGB 12.9 13.2 13.5  HCT 37.0 38.4 39.8  PLT 183 188 184   BMET  Recent Labs  10/20/14 1634 10/21/14 0148 10/22/14 0849  NA 138 140 138  K 2.9* 3.2* 4.1  CL 101 105 104  CO2 30 30 23   GLUCOSE 247* 148* 183*  BUN 12 7 <5*  CREATININE 0.53 0.42* 0.59  CALCIUM 8.8 8.7 9.0   LFT  Recent Labs  10/21/14 0148 10/22/14 0849  PROT 6.9  6.7  ALBUMIN 3.1* 3.2*  AST 836* 269*  ALT 589* 417*  ALKPHOS 93 108  BILITOT 2.5* 1.7*  BILIDIR 1.3*  --   IBILI 1.2*  --    PT/INR No results for input(s): LABPROT, INR in the last 72 hours.  Studies/Results: Ct Head Wo Contrast  10/22/2014   CLINICAL DATA:  Acute CVA, slurred speech.  Elevated blood pressure.  EXAM: CT HEAD WITHOUT CONTRAST  TECHNIQUE: Contiguous axial images were obtained from the base of the skull through the vertex without intravenous contrast.  COMPARISON:  None.  FINDINGS: Chronic microvascular disease throughout the deep white matter. Old lacunar infarcts in the left thalamus and right basal ganglia, stable. No acute infarction, hemorrhage or hydrocephalus. Old lacunar infarct in the right brainstem. No mass lesion or mass-effect. No extra-axial fluid collection. No acute calvarial abnormality.  Visualized paranasal sinuses and mastoids clear. Orbital soft tissues unremarkable.  IMPRESSION: Chronic microvascular disease. 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 No acute intracranial abnormality.   Electronically Signed   By: Charlett Nose M.D.   On: 10/22/2014 08:21   Nm Hepatobiliary Including Gb  10/21/2014   CLINICAL DATA:  Right upper quadrant pain.  EXAM: NUCLEAR MEDICINE  HEPATOBILIARY IMAGING WITH GALLBLADDER EF  TECHNIQUE: Sequential images of the abdomen were obtained out to 60 minutes following intravenous administration of radiopharmaceutical. After slow intravenous infusion of 1.5 micrograms Cholecystokinin, gallbladder ejection fraction was determined.  RADIOPHARMACEUTICALS:  5.0 Millicurie Tc-7m Choletec  COMPARISON:  None  FINDINGS: There is immediate homogeneous uptake of radiotracer in the liver. Filling of the gallbladder begins at 45 minutes. Radiotracer uptake is present in the small bowel at 43 minutes.  At the end of 1-hour, there is relatively poor clearance of activity from the liver.  When gallbladder filling was complete, the patient was given an IV infusion of CCK. The gallbladder ejection fraction measures 1.3%. At 45 min, normal ejection fraction is greater than 40%.  The patient did not experience symptoms during CCK infusion.  IMPRESSION: 1. No biliary obstruction. 2. Biliary dyskinesia. 3. Relatively poor clearance of activity from the liver as can be seen with hepatocellular disease.   Electronically Signed   By: Elige Ko   On: 10/21/2014 18:23   Ct Abdomen Pelvis W Contrast  10/20/2014   CLINICAL DATA:  Abdominal pain.  EXAM: CT ABDOMEN AND PELVIS WITH CONTRAST  TECHNIQUE: Multidetector CT imaging of the abdomen and pelvis was performed using the standard protocol following bolus administration of intravenous contrast.  CONTRAST:  OMNIPAQUE IOHEXOL 300 MG/ML  SOLN  COMPARISON:  08/27/2014  FINDINGS: Tortuous thoracic aorta.  Atelectasis in the lung bases.  The gallbladder is moderately distended. Increased density in the gallbladder suggests sludge. No discrete stones are visualized. There is mild gallbladder wall thickening and pericholecystic edema. Small amount of free fluid in the upper abdomen around the liver and gallbladder, likely reactive. Changes are nonspecific but suggest possible cholecystitis. No bile duct dilatation. The liver,  spleen, pancreas, gallbladder, kidneys, abdominal aorta, inferior vena cava, and retroperitoneal lymph nodes are unremarkable. Stomach and small bowel appear normal. Contrast material flows through to the colon without evidence of bowel obstruction. Diffusely stool-filled colon without wall thickening or abnormal distention. No free air or free fluid in the abdomen.  Pelvis: The appendix is not identified. Bladder wall is not thickened. Calcifications layering along the dependent portion of the bladder probably representing bladder stones. Bladder wall calcification is not excluded. Uterus is surgically absent. No pelvic mass or  lymphadenopathy. No free or loculated pelvic fluid collections. Degenerative changes in the spine and hips. No destructive bone lesions.  IMPRESSION: Distended gallbladder with probable sludge. Gallbladder wall thickening and edema. Free fluid in the right upper quadrant. Findings are suspicious for cholecystitis. Calcifications in the bladder probably represent bladder stones. Bladder wall calcification not excluded.   Electronically Signed   By: Burman Nieves M.D.   On: 10/20/2014 21:04   Mr 3d Recon At Scanner  10/22/2014   CLINICAL DATA:  Subsequent encounter for 2 day history of nausea and vomiting with right upper quadrant abdominal pain. Abnormal LFTs.  EXAM: MRI ABDOMEN WITHOUT AND WITH CONTRAST (INCLUDING MRCP)  TECHNIQUE: Multiplanar multisequence MR imaging of the abdomen was performed both before and after the administration of intravenous contrast. Heavily T2-weighted images of the biliary and pancreatic ducts were obtained, and three-dimensional MRCP images were rendered by post processing.  CONTRAST:  16mL MULTIHANCE GADOBENATE DIMEGLUMINE 529 MG/ML IV SOLN  COMPARISON:  CT from 10/20/2014.  FINDINGS: Lower chest:  Unremarkable.  Hepatobiliary: . No focal abnormality is seen in the liver parenchyma. There is no intrahepatic biliary duct dilatation. No dilatation of the  extrahepatic bile ducts. Layering sludge is seen in the lumen of the gallbladder in multiple tiny stones layer dependently within the sludge. Pericholecystic fluid is associated. No evidence for choledocholithiasis.  Pancreas: No focal mass lesion. No dilatation of the main duct. 2 mm cystic focus is identified in the body of the pancreas (see image 18 series 600 and image 29 series 1401).  Spleen: No splenomegaly. No focal mass lesion.  Adrenals/Urinary Tract: No adrenal nodule or mass. Tiny cortical cysts are noted in the left kidney.  Stomach/Bowel: Stomach is nondistended. No gastric wall thickening. No evidence of outlet obstruction. Duodenum is normally positioned as is the ligament of Treitz. No small bowel dilatation within the visualized abdomen.  Vascular/Lymphatic: No abdominal aortic aneurysm no evidence for abdominal lymphadenopathy.  Other: As mentioned above, there is a tiny amount of free fluid around the gallbladder.  Musculoskeletal: No abnormal signal within the visualized bony anatomy.  IMPRESSION: 1. Layering gallbladder sludge with tiny stones seen dependently within the gallbladder lumen. No evidence for choledocholithiasis. There is a small amount of fluid around the gallbladder as seen on the recent CT scan. 2. Tiny 2 mm cystic focus seen in the pancreatic parenchyma, most likely benign. Followup MRI in 1 year is recommended to re-evaluate. This recommendation follows ACR consensus guidelines: Managing Incidental Findings on Abdominal CT: White Paper of the ACR Incidental Findings Committee. Earlyne Iba Radiol [phone]   Electronically Signed   By: Kennith Center M.D.   On: 10/22/2014 14:52   Dg Abd Acute W/chest  10/20/2014   CLINICAL DATA:  Abdominal pain. Pain started last night. Pain relieved with bowel movement and numerous department  EXAM: ACUTE ABDOMEN SERIES (ABDOMEN 2 VIEW & CHEST 1 VIEW)  COMPARISON:  CT 08/27/2014  FINDINGS: No dilated loops of large or small bowel. There is  a moderate volume stool in the transverse colon. Large volume stool in the rectum measuring approximate 7 cm diameter. There is no intraperitoneal free air on the lateral projection. No free air beneath hemidiaphragms. Heart is enlarged.  IMPRESSION: 1. No evidence bowel obstruction or intraperitoneal free air. 2. Moderate to large volume stool in the colon and rectum.   Electronically Signed   By: Genevive Bi M.D.   On: 10/20/2014 17:54   Mr Roe Coombs W/wo Cm/mrcp  10/22/2014  CLINICAL DATA:  Subsequent encounter for 2 day history of nausea and vomiting with right upper quadrant abdominal pain. Abnormal LFTs.  EXAM: MRI ABDOMEN WITHOUT AND WITH CONTRAST (INCLUDING MRCP)  TECHNIQUE: Multiplanar multisequence MR imaging of the abdomen was performed both before and after the administration of intravenous contrast. Heavily T2-weighted images of the biliary and pancreatic ducts were obtained, and three-dimensional MRCP images were rendered by post processing.  CONTRAST:  16mL MULTIHANCE GADOBENATE DIMEGLUMINE 529 MG/ML IV SOLN  COMPARISON:  CT from 10/20/2014.  FINDINGS: Lower chest:  Unremarkable.  Hepatobiliary: . No focal abnormality is seen in the liver parenchyma. There is no intrahepatic biliary duct dilatation. No dilatation of the extrahepatic bile ducts. Layering sludge is seen in the lumen of the gallbladder in multiple tiny stones layer dependently within the sludge. Pericholecystic fluid is associated. No evidence for choledocholithiasis.  Pancreas: No focal mass lesion. No dilatation of the main duct. 2 mm cystic focus is identified in the body of the pancreas (see image 18 series 600 and image 29 series 1401).  Spleen: No splenomegaly. No focal mass lesion.  Adrenals/Urinary Tract: No adrenal nodule or mass. Tiny cortical cysts are noted in the left kidney.  Stomach/Bowel: Stomach is nondistended. No gastric wall thickening. No evidence of outlet obstruction. Duodenum is normally positioned as is the  ligament of Treitz. No small bowel dilatation within the visualized abdomen.  Vascular/Lymphatic: No abdominal aortic aneurysm no evidence for abdominal lymphadenopathy.  Other: As mentioned above, there is a tiny amount of free fluid around the gallbladder.  Musculoskeletal: No abnormal signal within the visualized bony anatomy.  IMPRESSION: 1. Layering gallbladder sludge with tiny stones seen dependently within the gallbladder lumen. No evidence for choledocholithiasis. There is a small amount of fluid around the gallbladder as seen on the recent CT scan. 2. Tiny 2 mm cystic focus seen in the pancreatic parenchyma, most likely benign. Followup MRI in 1 year is recommended to re-evaluate. This recommendation follows ACR consensus guidelines: Managing Incidental Findings on Abdominal CT: White Paper of the ACR Incidental Findings Committee. Earlyne Iba Radiol [phone]   Electronically Signed   By: Kennith Center M.D.   On: 10/22/2014 14:52   US Abdomen Limited Ruq  10/21/2014   CLINICAL DATA:  Abdominal pain. Changes of acute cholecystitis on CT abdomen and pelvis.  EXAM: US ABDOMEN LIMITED - RIGHT UPPER QUADRANT  COMPARISON:  CT abdomen and pelvis 10/20/2014.  FINDINGS: Gallbladder:  Gallbladder is distended with wall thickening and pericholecystic edema. Gallbladder wall measures up 8.4 mm thickness. Sludge and stones are demonstrated in the gallbladder. Murphy's sign is negative. Appearance is nonspecific but consistent with acute cholecystitis in the appropriate clinical setting.  Common bile duct:  Diameter: 4.7 mm, normal  Liver:  No focal lesion identified. Within normal limits in parenchymal echogenicity.  IMPRESSION: Gallbladder distention and wall thickening with pericholecystic edema. Stones and sludge. Murphy's sign negative. Appearance is nonspecific but consistent with acute cholecystitis in the appropriate clinical setting.   Electronically Signed   By: Burman Nieves M.D.   On: 10/21/2014  00:16    Impression: Elevated transaminases, most likely "reactive hepatopathy" related to gallbladder inflammation adjacent to the liver. No clear evidence of choledocholithiasis, either radiographically or biochemically.  1. For completeness, I will check for common infectious and metabolic sources of elevated transaminases, although I doubt they will be revealing of any underlying pathology 2. Based on the available information, it would seem appropriate to proceed with cholecystectomy, without need  for further preoperative bile duct imaging such as ERCP. 3. I will follow-up with the patient in a couple of days to review updated liver chemistries and the result of the above blood work. Please call if we can be of further assistance in her care. 4. The patient is probably a candidate for screening colonoscopy. I have advised the patient and her daughter, at the bedside, that once she is over the gallbladder issues, they could discuss the appropriateness of updated colon cancer screening with her primary physician, and contact me if he feels it is appropriate. 5. I will order a low-fat diet for the patient while awaiting timing of gallbladder surgery.    LOS: 2 days   Jamarrius Salay V  10/22/2014, 3:24 PM

## 2014-10-22 NOTE — Progress Notes (Signed)
Patient BP was 220/80 at 600. Gave hydralazine 10 mg IV to patient. Daughter stated that patient was slurring speech. Rechecked patient blood pressure, noted to be 118/56. Performed neuro and neurovascular assessment. No deficits were noted. Notified Betti Cruz MD via text page. Orders received at 712.  Update  7:16 Patient stating that she is having numbness in face per report of NT. Will continue to monitor and notify oncoming nurse.

## 2014-10-22 NOTE — Progress Notes (Signed)
Patient ID: Hannah Lamb, female   DOB: 1941/01/02, 74 y.o.   MRN: [id number]     [facility] [address] [place], [place] [address]    Phone: [phone] FAX: [phone]     Subjective: Had abdominal pain this AM.  No labs yet.    Objective:  Vital signs:  Filed Vitals:   10/22/14 0558 10/22/14 0600 10/22/14 0652 10/22/14 0705  BP:  220/80 118/56 112/62  Pulse:  64    Temp: 98.2 F (36.8 C)     TempSrc: Oral     Resp:      Height:      Weight:      SpO2:  98%      Last BM Date: 10/21/14  Intake/Output   Yesterday:  02/09 0701 - 02/10 0700 In: 1118.8 [I.V.:1018.8; IV Piggyback:100] Out: -  This shift:    I/O last 3 completed shifts: In: 1118.8 [I.V.:1018.8; IV Piggyback:100] Out: -     Physical Exam: General: Pt awake/alert/oriented x4 in no acute distress Abdomen: Soft. Nondistended. Non tender. No evidence of peritonitis. No incarcerated hernias.   Problem List:   Principal Problem:   Cholecystitis Active Problems:   Hypertension   Diabetes mellitus type 2, controlled   Hypokalemia   Acute cholecystitis    Results:   Labs: Results for orders placed or performed during the hospital encounter of 10/20/14 (from the past 48 hour(s))  CBC with Differential     Status: Abnormal   Collection Time: 10/20/14  4:34 PM  Result Value Ref Range   WBC 6.8 4.0 - 10.5 K/uL   RBC 4.52 3.87 - 5.11 MIL/uL   Hemoglobin 12.9 12.0 - 15.0 g/dL   HCT 37.0 36.0 - 46.0 %   MCV 81.9 78.0 - 100.0 fL   MCH 28.5 26.0 - 34.0 pg   MCHC 34.9 30.0 - 36.0 g/dL   RDW 13.9 11.5 - 15.5 %   Platelets 183 150 - 400 K/uL   Neutrophils Relative % 84 (H) 43 - 77 %   Neutro Abs 5.7 1.7 - 7.7 K/uL   Lymphocytes Relative 13 12 - 46 %   Lymphs Abs 0.9 0.7 - 4.0 K/uL   Monocytes Relative 3 3 - 12 %   Monocytes Absolute 0.2 0.1 - 1.0 K/uL   Eosinophils Relative 0 0 - 5 %   Eosinophils Absolute 0.0 0.0 - 0.7 K/uL   Basophils Relative 0 0 - 1 %   Basophils Absolute 0.0 0.0 - 0.1 K/uL  Lipase, blood     Status: None   Collection Time: 10/20/14  4:34 PM  Result Value Ref Range   Lipase 26 11 - 59 U/L  Comprehensive metabolic panel     Status: Abnormal   Collection Time: 10/20/14  4:34 PM  Result Value Ref Range   Sodium 138 135 - 145 mmol/L   Potassium 2.9 (L) 3.5 - 5.1 mmol/L   Chloride 101 96 - 112 mmol/L   CO2 30 19 - 32 mmol/L   Glucose, Bld 247 (H) 70 - 99 mg/dL   BUN 12 6 - 23 mg/dL   Creatinine, Ser 0.53 0.50 - 1.10 mg/dL   Calcium 8.8 8.4 - 10.5 mg/dL   Total Protein 6.7 6.0 - 8.3 g/dL   Albumin 3.0 (L) 3.5 - 5.2 g/dL   AST 585 (H) 0 - 37 U/L   ALT 305 (H) 0 - 35 U/L  Alkaline Phosphatase 70 39 - 117 U/L   Total Bilirubin 1.2 0.3 - 1.2 mg/dL   GFR calc non Af Amer >90 >90 mL/min   GFR calc Af Amer >90 >90 mL/min    Comment: (NOTE) The eGFR has been calculated using the CKD EPI equation. This calculation has not been validated in all clinical situations. eGFR's persistently <90 mL/min signify possible Chronic Kidney Disease.    Anion gap 7 5 - 15  I-Stat CG4 Lactic Acid, ED     Status: Abnormal   Collection Time: 10/20/14  4:49 PM  Result Value Ref Range   Lactic Acid, Venous 2.50 (HH) 0.5 - 2.0 mmol/L   Comment NOTIFIED PHYSICIAN   Urinalysis, Routine w reflex microscopic     Status: Abnormal   Collection Time: 10/20/14  5:19 PM  Result Value Ref Range   Color, Urine AMBER (A) YELLOW    Comment: BIOCHEMICALS MAY BE AFFECTED BY COLOR   APPearance CLEAR CLEAR   Specific Gravity, Urine 1.023 1.005 - 1.030   pH 6.0 5.0 - 8.0   Glucose, UA 250 (A) NEGATIVE mg/dL   Hgb urine dipstick NEGATIVE NEGATIVE   Bilirubin Urine SMALL (A) NEGATIVE   Ketones, ur NEGATIVE NEGATIVE mg/dL   Protein, ur NEGATIVE NEGATIVE mg/dL   Urobilinogen, UA 4.0 (H) 0.0 - 1.0 mg/dL   Nitrite NEGATIVE NEGATIVE   Leukocytes, UA NEGATIVE NEGATIVE    Comment: MICROSCOPIC NOT DONE ON URINES WITH NEGATIVE  PROTEIN, BLOOD, LEUKOCYTES, NITRITE, OR GLUCOSE <1000 mg/dL.  I-Stat CG4 Lactic Acid, ED     Status: Abnormal   Collection Time: 10/20/14  7:21 PM  Result Value Ref Range   Lactic Acid, Venous 2.04 (HH) 0.5 - 2.0 mmol/L   Comment NOTIFIED PHYSICIAN   Magnesium     Status: None   Collection Time: 10/21/14  1:48 AM  Result Value Ref Range   Magnesium 1.5 1.5 - 2.5 mg/dL  Hepatic function panel     Status: Abnormal   Collection Time: 10/21/14  1:48 AM  Result Value Ref Range   Total Protein 6.9 6.0 - 8.3 g/dL   Albumin 3.1 (L) 3.5 - 5.2 g/dL   AST 836 (H) 0 - 37 U/L   ALT 589 (H) 0 - 35 U/L   Alkaline Phosphatase 93 39 - 117 U/L   Total Bilirubin 2.5 (H) 0.3 - 1.2 mg/dL   Bilirubin, Direct 1.3 (H) 0.0 - 0.5 mg/dL   Indirect Bilirubin 1.2 (H) 0.3 - 0.9 mg/dL  CBC with Differential/Platelet     Status: None   Collection Time: 10/21/14  1:48 AM  Result Value Ref Range   WBC 5.0 4.0 - 10.5 K/uL   RBC 4.69 3.87 - 5.11 MIL/uL   Hemoglobin 13.2 12.0 - 15.0 g/dL   HCT 38.4 36.0 - 46.0 %   MCV 81.9 78.0 - 100.0 fL   MCH 28.1 26.0 - 34.0 pg   MCHC 34.4 30.0 - 36.0 g/dL   RDW 14.0 11.5 - 15.5 %   Platelets 188 150 - 400 K/uL   Neutrophils Relative % 64 43 - 77 %   Neutro Abs 3.2 1.7 - 7.7 K/uL   Lymphocytes Relative 25 12 - 46 %   Lymphs Abs 1.2 0.7 - 4.0 K/uL   Monocytes Relative 9 3 - 12 %   Monocytes Absolute 0.5 0.1 - 1.0 K/uL   Eosinophils Relative 2 0 - 5 %   Eosinophils Absolute 0.1 0.0 - 0.7 K/uL   Basophils  Relative 0 0 - 1 %   Basophils Absolute 0.0 0.0 - 0.1 K/uL  Basic metabolic panel     Status: Abnormal   Collection Time: 10/21/14  1:48 AM  Result Value Ref Range   Sodium 140 135 - 145 mmol/L   Potassium 3.2 (L) 3.5 - 5.1 mmol/L   Chloride 105 96 - 112 mmol/L   CO2 30 19 - 32 mmol/L   Glucose, Bld 148 (H) 70 - 99 mg/dL   BUN 7 6 - 23 mg/dL   Creatinine, Ser 0.42 (L) 0.50 - 1.10 mg/dL   Calcium 8.7 8.4 - 10.5 mg/dL   GFR calc non Af Amer >90 >90 mL/min   GFR calc  Af Amer >90 >90 mL/min    Comment: (NOTE) The eGFR has been calculated using the CKD EPI equation. This calculation has not been validated in all clinical situations. eGFR's persistently <90 mL/min signify possible Chronic Kidney Disease.    Anion gap 5 5 - 15  Glucose, capillary     Status: Abnormal   Collection Time: 10/21/14 11:49 AM  Result Value Ref Range   Glucose-Capillary 147 (H) 70 - 99 mg/dL  Glucose, capillary     Status: Abnormal   Collection Time: 10/21/14  8:10 PM  Result Value Ref Range   Glucose-Capillary 109 (H) 70 - 99 mg/dL  Glucose, capillary     Status: Abnormal   Collection Time: 10/22/14 12:11 AM  Result Value Ref Range   Glucose-Capillary 128 (H) 70 - 99 mg/dL  Glucose, capillary     Status: Abnormal   Collection Time: 10/22/14  6:12 AM  Result Value Ref Range   Glucose-Capillary 140 (H) 70 - 99 mg/dL    Imaging / Studies: Ct Head Wo Contrast  10/22/2014   CLINICAL DATA:  Acute CVA, slurred speech.  Elevated blood pressure.  EXAM: CT HEAD WITHOUT CONTRAST  TECHNIQUE: Contiguous axial images were obtained from the base of the skull through the vertex without intravenous contrast.  COMPARISON:  None.  FINDINGS: Chronic microvascular disease throughout the deep white matter. Old lacunar infarcts in the left thalamus and right basal ganglia, stable. No acute infarction, hemorrhage or hydrocephalus. Old lacunar infarct in the right brainstem. No mass lesion or mass-effect. No extra-axial fluid collection. No acute calvarial abnormality.  Visualized paranasal sinuses and mastoids clear. Orbital soft tissues unremarkable.  IMPRESSION: Chronic microvascular disease. 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 No acute intracranial abnormality.   Electronically Signed   By: Rolm Baptise M.D.   On: 10/22/2014 08:21   Nm Hepatobiliary Including Gb  10/21/2014   CLINICAL DATA:  Right upper quadrant pain.  EXAM: NUCLEAR MEDICINE HEPATOBILIARY IMAGING WITH GALLBLADDER EF  TECHNIQUE:  Sequential images of the abdomen were obtained out to 60 minutes following intravenous administration of radiopharmaceutical. After slow intravenous infusion of 1.5 micrograms Cholecystokinin, gallbladder ejection fraction was determined.  RADIOPHARMACEUTICALS:  5.0 [employer] [id number]-46N Choletec  COMPARISON:  None  FINDINGS: There is immediate homogeneous uptake of radiotracer in the liver. Filling of the gallbladder begins at 45 minutes. Radiotracer uptake is present in the small bowel at 43 minutes.  At the end of 1-hour, there is relatively poor clearance of activity from the liver.  When gallbladder filling was complete, the patient was given an IV infusion of CCK. The gallbladder ejection fraction measures 1.3%. At 45 min, normal ejection fraction is greater than 40%.  The patient did not experience symptoms during CCK infusion.  IMPRESSION: 1. No biliary obstruction. 2. Biliary dyskinesia.  3. Relatively poor clearance of activity from the liver as can be seen with hepatocellular disease.   Electronically Signed   By: Kathreen Devoid   On: 10/21/2014 18:23   Ct Abdomen Pelvis W Contrast  10/20/2014   CLINICAL DATA:  Abdominal pain.  EXAM: CT ABDOMEN AND PELVIS WITH CONTRAST  TECHNIQUE: Multidetector CT imaging of the abdomen and pelvis was performed using the standard protocol following bolus administration of intravenous contrast.  CONTRAST:  144mL OMNIPAQUE IOHEXOL 300 MG/ML  SOLN  COMPARISON:  08/27/2014  FINDINGS: Tortuous thoracic aorta.  Atelectasis in the lung bases.  The gallbladder is moderately distended. Increased density in the gallbladder suggests sludge. No discrete stones are visualized. There is mild gallbladder wall thickening and pericholecystic edema. Small amount of free fluid in the upper abdomen around the liver and gallbladder, likely reactive. Changes are nonspecific but suggest possible cholecystitis. No bile duct dilatation. The liver, spleen, pancreas, gallbladder, kidneys, abdominal  aorta, inferior vena cava, and retroperitoneal lymph nodes are unremarkable. Stomach and small bowel appear normal. Contrast material flows through to the colon without evidence of bowel obstruction. Diffusely stool-filled colon without wall thickening or abnormal distention. No free air or free fluid in the abdomen.  Pelvis: The appendix is not identified. Bladder wall is not thickened. Calcifications layering along the dependent portion of the bladder probably representing bladder stones. Bladder wall calcification is not excluded. Uterus is surgically absent. No pelvic mass or lymphadenopathy. No free or loculated pelvic fluid collections. Degenerative changes in the spine and hips. No destructive bone lesions.  IMPRESSION: Distended gallbladder with probable sludge. Gallbladder wall thickening and edema. Free fluid in the right upper quadrant. Findings are suspicious for cholecystitis. Calcifications in the bladder probably represent bladder stones. Bladder wall calcification not excluded.   Electronically Signed   By: Lucienne Capers M.D.   On: 10/20/2014 21:04   Dg Abd Acute W/chest  10/20/2014   CLINICAL DATA:  Abdominal pain. Pain started last night. Pain relieved with bowel movement and numerous department  EXAM: ACUTE ABDOMEN SERIES (ABDOMEN 2 VIEW & CHEST 1 VIEW)  COMPARISON:  CT 08/27/2014  FINDINGS: No dilated loops of large or small bowel. There is a moderate volume stool in the transverse colon. Large volume stool in the rectum measuring approximate 7 cm diameter. There is no intraperitoneal free air on the lateral projection. No free air beneath hemidiaphragms. Heart is enlarged.  IMPRESSION: 1. No evidence bowel obstruction or intraperitoneal free air. 2. Moderate to large volume stool in the colon and rectum.   Electronically Signed   By: Suzy Bouchard M.D.   On: 10/20/2014 17:54   US Abdomen Limited Ruq  10/21/2014   CLINICAL DATA:  Abdominal pain. Changes of acute cholecystitis on CT  abdomen and pelvis.  EXAM: US ABDOMEN LIMITED - RIGHT UPPER QUADRANT  COMPARISON:  CT abdomen and pelvis 10/20/2014.  FINDINGS: Gallbladder:  Gallbladder is distended with wall thickening and pericholecystic edema. Gallbladder wall measures up 8.4 mm thickness. Sludge and stones are demonstrated in the gallbladder. Murphy's sign is negative. Appearance is nonspecific but consistent with acute cholecystitis in the appropriate clinical setting.  Common bile duct:  Diameter: 4.7 mm, normal  Liver:  No focal lesion identified. Within normal limits in parenchymal echogenicity.  IMPRESSION: Gallbladder distention and wall thickening with pericholecystic edema. Stones and sludge. Murphy's sign negative. Appearance is nonspecific but consistent with acute cholecystitis in the appropriate clinical setting.   Electronically Signed   By: Lucienne Capers  M.D.   On: 10/21/2014 00:16    Medications / Allergies:  Scheduled Meds: . insulin aspart  0-9 Units Subcutaneous Q4H  . piperacillin-tazobactam (ZOSYN)  IV  3.375 g Intravenous Q8H   Continuous Infusions: . 0.9 % NaCl with KCl 20 mEq / L 75 mL/hr at 10/22/14 0526   PRN Meds:.acetaminophen **OR** acetaminophen, hydrALAZINE, morphine injection, ondansetron **OR** ondansetron (ZOFRAN) IV  Antibiotics: Anti-infectives    Start     Dose/Rate Route Frequency Ordered Stop   10/21/14 0200  piperacillin-tazobactam (ZOSYN) IVPB 3.375 g     3.375 g 12.5 mL/hr over 240 Minutes Intravenous Every 8 hours 10/21/14 0043          Assessment/Plan Cholelithiasis Biliary colic Biliary dyskinesia  transaminitis  -HIDA scan negative for acute cholecystitis, positive for biliary dyskinesia.  Awaiting AM labs.  Will proceed with MRCP given LFTs.  Consider GI consult. -NPO for now -IVF, pain control, monitor electrolytes -can stop zosyn -May need cholecystectomy this admission if abdominal pain persists and after GI Work up.  Will follow along.     Erby Pian,  [facility] Surgery Pager [phone]) For consults and floor pages call [phone]([id number])  10/22/2014 8:53 AM

## 2014-10-23 ENCOUNTER — Inpatient Hospital Stay (HOSPITAL_COMMUNITY): Payer: Medicare Other | Admitting: Certified Registered Nurse Anesthetist

## 2014-10-23 ENCOUNTER — Encounter (HOSPITAL_COMMUNITY)
Admission: EM | Disposition: A | Discharge status: Home / Self Care | Payer: Self-pay | Source: Home / Self Care | Attending: Internal Medicine

## 2014-10-23 ENCOUNTER — Encounter (HOSPITAL_COMMUNITY): Payer: Self-pay | Admitting: Anesthesiology

## 2014-10-23 ENCOUNTER — Inpatient Hospital Stay (HOSPITAL_COMMUNITY): Payer: Medicare Other

## 2014-10-23 HISTORY — PX: CHOLECYSTECTOMY: SHX55

## 2014-10-23 LAB — COMPREHENSIVE METABOLIC PANEL
ALT: 264 U/L — AB (ref 0–35)
AST: 109 U/L — ABNORMAL HIGH (ref 0–37)
Albumin: 2.9 g/dL — ABNORMAL LOW (ref 3.5–5.2)
Alkaline Phosphatase: 90 U/L (ref 39–117)
Anion gap: 10 (ref 5–15)
BUN: 12 mg/dL (ref 6–23)
CALCIUM: 9.2 mg/dL (ref 8.4–10.5)
CO2: 23 mmol/L (ref 19–32)
Chloride: 106 mmol/L (ref 96–112)
Creatinine, Ser: 0.6 mg/dL (ref 0.50–1.10)
GFR calc Af Amer: >90 mL/min (ref >90)
GFR, EST NON AFRICAN AMERICAN: 88 mL/min — AB (ref >90)
Glucose, Bld: 160 mg/dL — ABNORMAL HIGH (ref 70–99)
Potassium: 4.2 mmol/L (ref 3.5–5.1)
SODIUM: 139 mmol/L (ref 135–145)
TOTAL PROTEIN: 6.1 g/dL (ref 6.0–8.3)
Total Bilirubin: 0.8 mg/dL (ref 0.3–1.2)

## 2014-10-23 LAB — GLUCOSE, CAPILLARY
GLUCOSE-CAPILLARY: 130 mg/dL — AB (ref 70–99)
Glucose-Capillary: 143 mg/dL — ABNORMAL HIGH (ref 70–99)
Glucose-Capillary: 156 mg/dL — ABNORMAL HIGH (ref 70–99)
Glucose-Capillary: 150 mg/dL — ABNORMAL HIGH (ref 70–99)
Glucose-Capillary: 160 mg/dL — ABNORMAL HIGH (ref 70–99)

## 2014-10-23 LAB — CBC
HEMATOCRIT: 37.4 % (ref 36.0–46.0)
HEMOGLOBIN: 12.4 g/dL (ref 12.0–15.0)
MCH: 28.3 pg (ref 26.0–34.0)
MCHC: 33.2 g/dL (ref 30.0–36.0)
MCV: 85.4 fL (ref 78.0–100.0)
Platelets: 185 10*3/uL (ref 150–400)
RBC: 4.38 MIL/uL (ref 3.87–5.11)
RDW: 15 % (ref 11.5–15.5)
WBC: 4.4 10*3/uL (ref 4.0–10.5)

## 2014-10-23 LAB — HEPATITIS C ANTIBODY: HCV Ab: NEGATIVE

## 2014-10-23 LAB — HEPATITIS B SURFACE ANTIGEN: Hepatitis B Surface Ag: NEGATIVE

## 2014-10-23 LAB — FERRITIN: Ferritin: 316 ng/mL — ABNORMAL HIGH (ref 10–291)

## 2014-10-23 SURGERY — LAPAROSCOPIC CHOLECYSTECTOMY WITH INTRAOPERATIVE CHOLANGIOGRAM
Anesthesia: General | Site: Abdomen

## 2014-10-23 MED ORDER — LIDOCAINE HCL 4 % MT SOLN
OROMUCOSAL | Status: DC | PRN
  Administered 2014-10-23: 4 mL via TOPICAL

## 2014-10-23 MED ORDER — GLYCOPYRROLATE 0.2 MG/ML IJ SOLN
INTRAMUSCULAR | Status: DC | PRN
  Administered 2014-10-23: 0.1 mg via INTRAVENOUS

## 2014-10-23 MED ORDER — SUCCINYLCHOLINE CHLORIDE 20 MG/ML IJ SOLN
INTRAMUSCULAR | Status: DC | PRN
  Administered 2014-10-23: 40 mg via INTRAVENOUS

## 2014-10-23 MED ORDER — LIDOCAINE HCL (CARDIAC) 20 MG/ML IV SOLN
INTRAVENOUS | Status: DC | PRN
  Administered 2014-10-23: 100 mg via INTRAVENOUS

## 2014-10-23 MED ORDER — ROCURONIUM BROMIDE 100 MG/10ML IV SOLN
INTRAVENOUS | Status: DC | PRN
  Administered 2014-10-23: 5 mg via INTRAVENOUS

## 2014-10-23 MED ORDER — FENTANYL CITRATE 0.05 MG/ML IJ SOLN
INTRAMUSCULAR | Status: AC
  Filled 2014-10-23: qty 5

## 2014-10-23 MED ORDER — LACTATED RINGERS IV SOLN
INTRAVENOUS | Status: DC | PRN
  Administered 2014-10-23: 11:00:00 via INTRAVENOUS

## 2014-10-23 MED ORDER — PROPOFOL 10 MG/ML IV BOLUS
INTRAVENOUS | Status: DC | PRN
  Administered 2014-10-23: 20 mg via INTRAVENOUS
  Administered 2014-10-23: 140 mg via INTRAVENOUS
  Administered 2014-10-23: 40 mg via INTRAVENOUS

## 2014-10-23 MED ORDER — FENTANYL CITRATE 0.05 MG/ML IJ SOLN
INTRAMUSCULAR | Status: DC | PRN
  Administered 2014-10-23: 75 ug via INTRAVENOUS

## 2014-10-23 MED ORDER — SODIUM CHLORIDE 0.9 % IV SOLN
INTRAVENOUS | Status: DC | PRN
  Administered 2014-10-23: 10 mL

## 2014-10-23 MED ORDER — HYDROMORPHONE HCL 1 MG/ML IJ SOLN: 0.2500 mg | INTRAMUSCULAR | Status: DC | PRN

## 2014-10-23 MED ORDER — ALBUMIN HUMAN 5 % IV SOLN
INTRAVENOUS | Status: DC | PRN
  Administered 2014-10-23: 12:00:00 via INTRAVENOUS

## 2014-10-23 MED ORDER — PHENYLEPHRINE HCL 10 MG/ML IJ SOLN
INTRAMUSCULAR | Status: DC | PRN
Start: 2014-10-23 — End: 2014-10-23
  Administered 2014-10-23 (×3): 40 ug via INTRAVENOUS

## 2014-10-23 MED ORDER — LACTATED RINGERS IV SOLN
INTRAVENOUS | Status: DC
  Administered 2014-10-23: 10 mL/h via INTRAVENOUS

## 2014-10-23 MED ORDER — PHENYLEPHRINE HCL 10 MG/ML IJ SOLN
10.0000 mg | INTRAVENOUS | Status: DC | PRN
  Administered 2014-10-23: 5 ug/min via INTRAVENOUS

## 2014-10-23 MED ORDER — PROPOFOL 10 MG/ML IV BOLUS
INTRAVENOUS | Status: AC
  Filled 2014-10-23: qty 20

## 2014-10-23 MED ORDER — ONDANSETRON HCL 4 MG/2ML IJ SOLN: 4.0000 mg | Freq: Once | INTRAMUSCULAR | Status: DC | PRN

## 2014-10-23 MED ORDER — 0.9 % SODIUM CHLORIDE (POUR BTL) OPTIME
TOPICAL | Status: DC | PRN
  Administered 2014-10-23: 1000 mL

## 2014-10-23 MED ORDER — AMLODIPINE BESYLATE 5 MG PO TABS
5.0000 mg | ORAL_TABLET | Freq: Every day | ORAL | Status: DC
  Administered 2014-10-24 – 2014-10-25 (×2): 5 mg via ORAL
  Filled 2014-10-23 (×2): qty 1

## 2014-10-23 MED ORDER — BUPIVACAINE-EPINEPHRINE (PF) 0.25% -1:200000 IJ SOLN
INTRAMUSCULAR | Status: AC
  Filled 2014-10-23: qty 30

## 2014-10-23 MED ORDER — NEOSTIGMINE METHYLSULFATE 10 MG/10ML IV SOLN
INTRAVENOUS | Status: DC | PRN
  Administered 2014-10-23: 1 mg via INTRAVENOUS

## 2014-10-23 MED ORDER — SODIUM CHLORIDE 0.9 % IR SOLN
Status: DC | PRN
  Administered 2014-10-23: 1000 mL

## 2014-10-23 MED ORDER — EPHEDRINE SULFATE 50 MG/ML IJ SOLN
INTRAMUSCULAR | Status: DC | PRN
  Administered 2014-10-23 (×3): 5 mg via INTRAVENOUS

## 2014-10-23 MED ORDER — CETYLPYRIDINIUM CHLORIDE 0.05 % MT LIQD
7.0000 mL | Freq: Two times a day (BID) | OROMUCOSAL | Status: DC
  Administered 2014-10-23 – 2014-10-25 (×3): 7 mL via OROMUCOSAL

## 2014-10-23 MED ORDER — BUPIVACAINE-EPINEPHRINE 0.25% -1:200000 IJ SOLN
INTRAMUSCULAR | Status: DC | PRN
  Administered 2014-10-23: 5 mL

## 2014-10-23 SURGICAL SUPPLY — 47 items
APPLIER CLIP ROT 10 11.4 M/L (STAPLE) ×3
APR CLP MED LRG 11.4X10 (STAPLE) ×1
BAG SPEC RTRVL LRG 6X4 10 (ENDOMECHANICALS) ×1
BLADE SURG ROTATE 9660 (MISCELLANEOUS) IMPLANT
CANISTER SUCTION 2500CC (MISCELLANEOUS) ×3 IMPLANT
CHLORAPREP W/TINT 26ML (MISCELLANEOUS) ×3 IMPLANT
CLIP APPLIE ROT 10 11.4 M/L (STAPLE) ×1 IMPLANT
COVER MAYO STAND STRL (DRAPES) ×3 IMPLANT
COVER SURGICAL LIGHT HANDLE (MISCELLANEOUS) ×3 IMPLANT
DRAPE C-ARM 42X72 X-RAY (DRAPES) ×3 IMPLANT
DRAPE LAPAROSCOPIC ABDOMINAL (DRAPES) ×3 IMPLANT
DRAPE WARM FLUID 44X44 (DRAPE) ×3 IMPLANT
ELECT REM PT RETURN 9FT ADLT (ELECTROSURGICAL) ×3
ELECTRODE REM PT RTRN 9FT ADLT (ELECTROSURGICAL) ×1 IMPLANT
GLOVE BIO SURGEON STRL SZ7 (GLOVE) ×3 IMPLANT
GLOVE BIO SURGEON STRL SZ7.5 (GLOVE) ×3 IMPLANT
GLOVE BIO SURGEON STRL SZ8 (GLOVE) ×3 IMPLANT
GLOVE BIOGEL PI IND STRL 7.0 (GLOVE) ×2 IMPLANT
GLOVE BIOGEL PI IND STRL 7.5 (GLOVE) ×1 IMPLANT
GLOVE BIOGEL PI IND STRL 8 (GLOVE) ×2 IMPLANT
GLOVE BIOGEL PI INDICATOR 7.0 (GLOVE) ×4
GLOVE BIOGEL PI INDICATOR 7.5 (GLOVE) ×2
GLOVE BIOGEL PI INDICATOR 8 (GLOVE) ×4
GLOVE SURG SS PI 8.0 STRL IVOR (GLOVE) ×3 IMPLANT
GOWN STRL REUS W/ TWL LRG LVL3 (GOWN DISPOSABLE) ×3 IMPLANT
GOWN STRL REUS W/ TWL XL LVL3 (GOWN DISPOSABLE) ×2 IMPLANT
GOWN STRL REUS W/TWL LRG LVL3 (GOWN DISPOSABLE) ×9
GOWN STRL REUS W/TWL XL LVL3 (GOWN DISPOSABLE) ×6
KIT BASIN OR (CUSTOM PROCEDURE TRAY) ×3 IMPLANT
KIT ROOM TURNOVER OR (KITS) ×3 IMPLANT
LIQUID BAND (GAUZE/BANDAGES/DRESSINGS) ×3 IMPLANT
NS IRRIG 1000ML POUR BTL (IV SOLUTION) ×3 IMPLANT
PAD ARMBOARD 7.5X6 YLW CONV (MISCELLANEOUS) ×3 IMPLANT
POUCH SPECIMEN RETRIEVAL 10MM (ENDOMECHANICALS) ×3 IMPLANT
SCISSORS LAP 5X35 DISP (ENDOMECHANICALS) ×3 IMPLANT
SET CHOLANGIOGRAPH 5 50 .035 (SET/KITS/TRAYS/PACK) ×3 IMPLANT
SET IRRIG TUBING LAPAROSCOPIC (IRRIGATION / IRRIGATOR) ×3 IMPLANT
SLEEVE ENDOPATH XCEL 5M (ENDOMECHANICALS) ×3 IMPLANT
SPECIMEN JAR SMALL (MISCELLANEOUS) ×3 IMPLANT
SUT MNCRL AB 4-0 PS2 18 (SUTURE) ×3 IMPLANT
TOWEL OR 17X24 6PK STRL BLUE (TOWEL DISPOSABLE) IMPLANT
TOWEL OR 17X26 10 PK STRL BLUE (TOWEL DISPOSABLE) ×3 IMPLANT
TRAY LAPAROSCOPIC (CUSTOM PROCEDURE TRAY) ×3 IMPLANT
TROCAR XCEL BLUNT TIP 100MML (ENDOMECHANICALS) ×3 IMPLANT
TROCAR XCEL NON-BLD 11X100MML (ENDOMECHANICALS) ×3 IMPLANT
TROCAR XCEL NON-BLD 5MMX100MML (ENDOMECHANICALS) ×3 IMPLANT
TUBING INSUFFLATION (TUBING) ×3 IMPLANT

## 2014-10-23 NOTE — Progress Notes (Signed)
OT Cancellation Note  Patient Details Name: HARLOW BASLEY MRN: [id number] DOB: 29-Mar-1941   Cancelled Treatment:    Reason Eval/Treat Not Completed: Patient at procedure or test/ unavailable. Per RN, pt at surgery for gallbladder. OT will follow up with family to determine appropriateness of therapy.   Nena Jordan M 10/23/2014, 11:01 AM   Carney Living, OTR/L Occupational Therapist [phone] (pager)

## 2014-10-23 NOTE — Progress Notes (Addendum)
Pt with complaint of sob. VS taken with bp of 169/87, r-20, breath sounds clear bilaterllay. Will page rapid response and MD on call and await further orders. Pt reassessed and in no apparent distress at this time. Pt resting. Will continue to monitor pt.

## 2014-10-23 NOTE — Transfer of Care (Signed)
Immediate Anesthesia Transfer of Care Note  Patient: Hannah Lamb  Procedure(s) Performed: Procedure(s): LAPAROSCOPIC CHOLECYSTECTOMY WITH INTRAOPERATIVE CHOLANGIOGRAM (N/A)  Patient Location: PACU  Anesthesia Type:General  Level of Consciousness: awake, alert  and oriented  Airway & Oxygen Therapy: Patient connected to face mask oxygen  Post-op Assessment: Report given to RN  Post vital signs: stable  Last Vitals:  Filed Vitals:   10/23/14 1013  BP: 199/97  Pulse:   Temp:   Resp:     Complications: No apparent anesthesia complications

## 2014-10-23 NOTE — Progress Notes (Signed)
[facility] PROGRESS NOTE   Hannah Lamb IHK:[id number] DOB: 09/29/1940 DOA: 10/20/2014 PCP: Lonia Blood, MD  HPI/Subjective: Feeling better today.  No specific concerns from patient.  Daughter at bedside had questions about tests that were performed yesterday.  Assessment/Plan: Principal Problem:   Cholecystitis Active Problems:   Hypertension   Diabetes mellitus type 2, controlled   Hypokalemia   Acute cholecystitis  Acute cholecystitis with cholelithiasis -Presented with nausea, vomiting and RUQ abdominal pain. -CT scan of abdomen and pelvis was done and showed sludge with pericholecystic fluids. - MRCP showed layering gallbladder sludge with tiny stones seen dependently within the gallbladder lumen. -General surgery following, plan for cholecystectomy today. -HIDA scan on 10/21/2014 showed no biliary obstruction, biliary dyskinesis, relatively poor clearance of activity from the liver as can be seen with hepatocellular disease. -Right upper quadrant ultrasound on 10/21/2014 showed gallbladder distention with wall thickening with pericholecystic edema. -Continue Zosyn.  Elevated LFTs  -Appreciate input of [facility], hepatitis workup pending, ANA and ferritin pending.  Slurred speech morning of 10/22/2014 -Resolved. -Patient has no focal neurologic deficits on exam.  Head CT is negative for any acute findings. -Serial neuro checks for 24 hours every 4 hours negative.  Diabetes mellitus type 2 uncontrolled  -Since patient is NPO, sliding scale coverage.  -Closely follow CBG trends.  Uncontrolled hypertension  -Increase amlodipine from 2.5 mg daily to 5 mg daily. -PRN hydralazine, patient had a significant blood pressure drop after being given hydralazine continue to monitor carefully.   Hypokalemia  -Resolved with replacement.   Generalized weakness -Would benefit from physical therapy after surgery. -Patient has knee pain which has limited her mobility.   Daughter indicated that her primary care physician was considering further imaging studies chest x-ray or MRI for further evaluation.  Code Status: Full Code Family Communication: Plan discussed with the patient. Disposition Plan: Remains inpatient Diet: Diet NPO time specified  Consultants:  Gen Surgery  GI  Procedures:  None  Antibiotics:  Zosyn   Objective: Filed Vitals:   10/23/14 1013  BP: 199/97  Pulse:   Temp:   Resp:     Intake/Output Summary (Last 24 hours) at 10/23/14 1028 Last data filed at 10/23/14 0900  Gross per 24 hour  Intake    950 ml  Output      0 ml  Net    950 ml   Filed Weights   10/21/14 0100  Weight: 74.844 kg (165 lb)    Exam:  Physical Exam: General: Awake, Oriented, No acute distress. HEENT: EOMI. Neck: Supple CV: S1 and S2 Lungs: Clear to ascultation bilaterally Abdomen: Soft, Nontender, Nondistended, +bowel sounds. Ext: Good pulses. Trace edema.  Data Reviewed: Basic Metabolic Panel:  Recent Labs Lab 10/20/14 1634 10/21/14 0148 10/22/14 0849 10/23/14 0710  NA 138 140 138 139  K 2.9* 3.2* 4.1 4.2  CL 101 105 104 106  CO2 GLUCOSE 247* 148* 183* 160*  BUN 12 7 <5* 12  CREATININE 0.53 0.42* 0.59 0.60  CALCIUM 8.8 8.7 9.0 9.2  MG  --  1.5  --   --    Liver Function Tests:  Recent Labs Lab 10/20/14 1634 10/21/14 0148 10/22/14 0849 10/23/14 0710  AST 585* 836* 269* 109*  ALT 305* 589* 417* 264*  ALKPHOS 70 93 108 90  BILITOT 1.2 2.5* 1.7* 0.8  PROT 6.7 6.9 6.7 6.1  ALBUMIN 3.0* 3.1* 3.2* 2.9*    Recent Labs Lab 10/20/14 1634  LIPASE 26  No results for input(s): AMMONIA in the last 168 hours. CBC:  Recent Labs Lab 10/20/14 1634 10/21/14 0148 10/22/14 0849 10/23/14 0710  WBC 6.8 5.0 4.8 4.4  NEUTROABS 5.7 3.2  --   --   HGB 12.9 13.2 13.5 12.4  HCT 37.0 38.4 39.8 37.4  MCV 81.9 81.9 84.3 85.4  PLT 183 188 184 185   Cardiac Enzymes: No results for input(s): CKTOTAL, CKMB,  CKMBINDEX, TROPONINI in the last 168 hours. BNP (last 3 results) No results for input(s): BNP in the last 8760 hours.  ProBNP (last 3 results) No results for input(s): PROBNP in the last 8760 hours.  CBG:  Recent Labs Lab 10/22/14 1207 10/22/14 1704 10/22/14 1946 10/23/14 0808 10/23/14 1008  GLUCAP 137* 130* 146* 130* 143*    Micro No results found for this or any previous visit (from the past 240 hour(s)).   Studies: Ct Head Wo Contrast  10/22/2014   CLINICAL DATA:  Acute CVA, slurred speech.  Elevated blood pressure.  EXAM: CT HEAD WITHOUT CONTRAST  TECHNIQUE: Contiguous axial images were obtained from the base of the skull through the vertex without intravenous contrast.  COMPARISON:  None.  FINDINGS: Chronic microvascular disease throughout the deep white matter. Old lacunar infarcts in the left thalamus and right basal ganglia, stable. No acute infarction, hemorrhage or hydrocephalus. Old lacunar infarct in the right brainstem. No mass lesion or mass-effect. No extra-axial fluid collection. No acute calvarial abnormality.  Visualized paranasal sinuses and mastoids clear. Orbital soft tissues unremarkable.  IMPRESSION: Chronic microvascular disease. 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 No acute intracranial abnormality.   Electronically Signed   By: Charlett Nose M.D.   On: 10/22/2014 08:21   Nm Hepatobiliary Including Gb  10/21/2014   CLINICAL DATA:  Right upper quadrant pain.  EXAM: NUCLEAR MEDICINE HEPATOBILIARY IMAGING WITH GALLBLADDER EF  TECHNIQUE: Sequential images of the abdomen were obtained out to 60 minutes following intravenous administration of radiopharmaceutical. After slow intravenous infusion of 1.5 micrograms Cholecystokinin, gallbladder ejection fraction was determined.  RADIOPHARMACEUTICALS:  5.0 Millicurie Tc-43m Choletec  COMPARISON:  None  FINDINGS: There is immediate homogeneous uptake of radiotracer in the liver. Filling of the gallbladder begins at 45 minutes.  Radiotracer uptake is present in the small bowel at 43 minutes.  At the end of 1-hour, there is relatively poor clearance of activity from the liver.  When gallbladder filling was complete, the patient was given an IV infusion of CCK. The gallbladder ejection fraction measures 1.3%. At 45 min, normal ejection fraction is greater than 40%.  The patient did not experience symptoms during CCK infusion.  IMPRESSION: 1. No biliary obstruction. 2. Biliary dyskinesia. 3. Relatively poor clearance of activity from the liver as can be seen with hepatocellular disease.   Electronically Signed   By: Elige Ko   On: 10/21/2014 18:23   Mr 3d Recon At Scanner  10/22/2014   CLINICAL DATA:  Subsequent encounter for 2 day history of nausea and vomiting with right upper quadrant abdominal pain. Abnormal LFTs.  EXAM: MRI ABDOMEN WITHOUT AND WITH CONTRAST (INCLUDING MRCP)  TECHNIQUE: Multiplanar multisequence MR imaging of the abdomen was performed both before and after the administration of intravenous contrast. Heavily T2-weighted images of the biliary and pancreatic ducts were obtained, and three-dimensional MRCP images were rendered by post processing.  CONTRAST:  17mL MULTIHANCE GADOBENATE DIMEGLUMINE 529 MG/ML IV SOLN  COMPARISON:  CT from 10/20/2014.  FINDINGS: Lower chest:  Unremarkable.  Hepatobiliary: . No focal abnormality is seen  in the liver parenchyma. There is no intrahepatic biliary duct dilatation. No dilatation of the extrahepatic bile ducts. Layering sludge is seen in the lumen of the gallbladder in multiple tiny stones layer dependently within the sludge. Pericholecystic fluid is associated. No evidence for choledocholithiasis.  Pancreas: No focal mass lesion. No dilatation of the main duct. 2 mm cystic focus is identified in the body of the pancreas (see image 18 series 600 and image 29 series 1401).  Spleen: No splenomegaly. No focal mass lesion.  Adrenals/Urinary Tract: No adrenal nodule or mass. Tiny  cortical cysts are noted in the left kidney.  Stomach/Bowel: Stomach is nondistended. No gastric wall thickening. No evidence of outlet obstruction. Duodenum is normally positioned as is the ligament of Treitz. No small bowel dilatation within the visualized abdomen.  Vascular/Lymphatic: No abdominal aortic aneurysm no evidence for abdominal lymphadenopathy.  Other: As mentioned above, there is a tiny amount of free fluid around the gallbladder.  Musculoskeletal: No abnormal signal within the visualized bony anatomy.  IMPRESSION: 1. Layering gallbladder sludge with tiny stones seen dependently within the gallbladder lumen. No evidence for choledocholithiasis. There is a small amount of fluid around the gallbladder as seen on the recent CT scan. 2. Tiny 2 mm cystic focus seen in the pancreatic parenchyma, most likely benign. Followup MRI in 1 year is recommended to re-evaluate. This recommendation follows ACR consensus guidelines: Managing Incidental Findings on Abdominal CT: White Paper of the ACR Incidental Findings Committee. Earlyne Iba Radiol [phone]   Electronically Signed   By: Kennith Center M.D.   On: 10/22/2014 14:52   Mr Roe Coombs W/wo Cm/mrcp  10/22/2014   CLINICAL DATA:  Subsequent encounter for 2 day history of nausea and vomiting with right upper quadrant abdominal pain. Abnormal LFTs.  EXAM: MRI ABDOMEN WITHOUT AND WITH CONTRAST (INCLUDING MRCP)  TECHNIQUE: Multiplanar multisequence MR imaging of the abdomen was performed both before and after the administration of intravenous contrast. Heavily T2-weighted images of the biliary and pancreatic ducts were obtained, and three-dimensional MRCP images were rendered by post processing.  CONTRAST:  16mL MULTIHANCE GADOBENATE DIMEGLUMINE 529 MG/ML IV SOLN  COMPARISON:  CT from 10/20/2014.  FINDINGS: Lower chest:  Unremarkable.  Hepatobiliary: . No focal abnormality is seen in the liver parenchyma. There is no intrahepatic biliary duct dilatation. No  dilatation of the extrahepatic bile ducts. Layering sludge is seen in the lumen of the gallbladder in multiple tiny stones layer dependently within the sludge. Pericholecystic fluid is associated. No evidence for choledocholithiasis.  Pancreas: No focal mass lesion. No dilatation of the main duct. 2 mm cystic focus is identified in the body of the pancreas (see image 18 series 600 and image 29 series 1401).  Spleen: No splenomegaly. No focal mass lesion.  Adrenals/Urinary Tract: No adrenal nodule or mass. Tiny cortical cysts are noted in the left kidney.  Stomach/Bowel: Stomach is nondistended. No gastric wall thickening. No evidence of outlet obstruction. Duodenum is normally positioned as is the ligament of Treitz. No small bowel dilatation within the visualized abdomen.  Vascular/Lymphatic: No abdominal aortic aneurysm no evidence for abdominal lymphadenopathy.  Other: As mentioned above, there is a tiny amount of free fluid around the gallbladder.  Musculoskeletal: No abnormal signal within the visualized bony anatomy.  IMPRESSION: 1. Layering gallbladder sludge with tiny stones seen dependently within the gallbladder lumen. No evidence for choledocholithiasis. There is a small amount of fluid around the gallbladder as seen on the recent CT scan. 2. Tiny 2 mm  cystic focus seen in the pancreatic parenchyma, most likely benign. Followup MRI in 1 year is recommended to re-evaluate. This recommendation follows ACR consensus guidelines: Managing Incidental Findings on Abdominal CT: White Paper of the ACR Incidental Findings Committee. Earlyne Iba Radiol [phone]   Electronically Signed   By: Kennith Center M.D.   On: 10/22/2014 14:52    Scheduled Meds: . amLODipine  2.5 mg Oral Daily  . insulin aspart  0-9 Units Subcutaneous TID WC  . piperacillin-tazobactam (ZOSYN)  IV  3.375 g Intravenous Q8H   Continuous Infusions: . 0.9 % NaCl with KCl 20 mEq / L 75 mL/hr at 10/23/14 0643    Bali Lyn A,  MD [facility] 10/23/2014, 10:28 AM  LOS: 3 days

## 2014-10-23 NOTE — Anesthesia Preprocedure Evaluation (Addendum)
Anesthesia Evaluation  Patient identified by MRN, date of birth, ID band Patient awake    Reviewed: Allergy & Precautions, NPO status , Patient's Chart, lab work & pertinent test results  Airway        Dental   Pulmonary          Cardiovascular hypertension, + Past MI     Neuro/Psych  Neuromuscular disease CVA    GI/Hepatic   Endo/Other  diabetes, Type 2, Insulin Dependent  Renal/GU Renal disease     Musculoskeletal   Abdominal   Peds  Hematology   Anesthesia Other Findings MS  Reproductive/Obstetrics                            Anesthesia Physical Anesthesia Plan  ASA: III  Anesthesia Plan: General   Post-op Pain Management:    Induction: Intravenous  Airway Management Planned: Oral ETT  Additional Equipment:   Intra-op Plan:   Post-operative Plan: Extubation in OR  Informed Consent:   Plan Discussed with: CRNA, Anesthesiologist and Surgeon  Anesthesia Plan Comments:         Anesthesia Quick Evaluation

## 2014-10-23 NOTE — Progress Notes (Signed)
Patient ID: Hannah Lamb, female   DOB: Dec 07, 1940, 74 y.o.   MRN: [id number]     [address] [place], [place] [address]    Phone: [phone] FAX: [phone]     Subjective: No pain.  Denies sob, cp, palpitations.   Objective:  Vital signs:  Filed Vitals:   10/22/14 2033 10/22/14 2146 10/23/14 0644 10/23/14 0756  BP: 171/72 158/79 178/79 153/79  Pulse: 83  71 83  Temp: 98.9 F (37.2 C)  98.6 F (37 C)   TempSrc: Oral  Oral   Resp: 18  15   Height:      Weight:      SpO2: 100%  100% 100%    Last BM Date: 10/21/14  Intake/Output   Yesterday:  02/10 0701 - 02/11 0700 In: 950 [I.V.:900; IV Piggyback:50] Out: -  This shift:    I/O last 3 completed shifts: In: 950 [I.V.:900; IV Piggyback:50] Out: -      Physical Exam: General: Pt awake/alert/oriented x4 in no acute distress Chest: cta. No chest wall pain w good excursion CV:  s1s2 rrr with 2/6 SEM MS: Normal AROM mjr joints.  No obvious deformity Abdomen: Soft.  Nondistended.  Non tender.  No evidence of peritonitis.  No incarcerated hernias. Ext:  SCDs BLE.  No mjr edema.  No cyanosis Skin: No petechiae / purpura   Problem List:   Principal Problem:   Cholecystitis Active Problems:   Hypertension   Diabetes mellitus type 2, controlled   Hypokalemia   Acute cholecystitis    Results:   Labs: Results for orders placed or performed during the hospital encounter of 10/20/14 (from the past 48 hour(s))  Glucose, capillary     Status: Abnormal   Collection Time: 10/21/14 11:49 AM  Result Value Ref Range   Glucose-Capillary 147 (H) 70 - 99 mg/dL  Glucose, capillary     Status: Abnormal   Collection Time: 10/21/14  8:10 PM  Result Value Ref Range   Glucose-Capillary 109 (H) 70 - 99 mg/dL  Glucose, capillary     Status: Abnormal   Collection Time: 10/22/14 12:11 AM  Result Value Ref Range   Glucose-Capillary 128 (H) 70 - 99 mg/dL   Glucose, capillary     Status: Abnormal   Collection Time: 10/22/14  6:12 AM  Result Value Ref Range   Glucose-Capillary 140 (H) 70 - 99 mg/dL  Comprehensive metabolic panel     Status: Abnormal   Collection Time: 10/22/14  8:49 AM  Result Value Ref Range   Sodium 138 135 - 145 mmol/L   Potassium 4.1 3.5 - 5.1 mmol/L    Comment: DELTA CHECK NOTED   Chloride 104 96 - 112 mmol/L   CO2 23 19 - 32 mmol/L   Glucose, Bld 183 (H) 70 - 99 mg/dL   BUN <5 (L) 6 - 23 mg/dL   Creatinine, Ser 0.59 0.50 - 1.10 mg/dL   Calcium 9.0 8.4 - 10.5 mg/dL   Total Protein 6.7 6.0 - 8.3 g/dL   Albumin 3.2 (L) 3.5 - 5.2 g/dL   AST 269 (H) 0 - 37 U/L   ALT 417 (H) 0 - 35 U/L   Alkaline Phosphatase 108 39 - 117 U/L   Total Bilirubin 1.7 (H) 0.3 - 1.2 mg/dL   GFR calc non Af Amer 88 (L) >90 mL/min   GFR calc Af Amer >90 >90 mL/min  Comment: (NOTE) The eGFR has been calculated using the CKD EPI equation. This calculation has not been validated in all clinical situations. eGFR's persistently <90 mL/min signify possible Chronic Kidney Disease.    Anion gap 11 5 - 15  CBC     Status: None   Collection Time: 10/22/14  8:49 AM  Result Value Ref Range   WBC 4.8 4.0 - 10.5 K/uL   RBC 4.72 3.87 - 5.11 MIL/uL   Hemoglobin 13.5 12.0 - 15.0 g/dL   HCT 39.8 36.0 - 46.0 %   MCV 84.3 78.0 - 100.0 fL   MCH 28.6 26.0 - 34.0 pg   MCHC 33.9 30.0 - 36.0 g/dL   RDW 14.6 11.5 - 15.5 %   Platelets 184 150 - 400 K/uL  Glucose, capillary     Status: Abnormal   Collection Time: 10/22/14  8:58 AM  Result Value Ref Range   Glucose-Capillary 178 (H) 70 - 99 mg/dL  Glucose, capillary     Status: Abnormal   Collection Time: 10/22/14 12:07 PM  Result Value Ref Range   Glucose-Capillary 137 (H) 70 - 99 mg/dL  Glucose, capillary     Status: Abnormal   Collection Time: 10/22/14  5:04 PM  Result Value Ref Range   Glucose-Capillary 130 (H) 70 - 99 mg/dL  Glucose, capillary     Status: Abnormal   Collection Time: 10/22/14   7:46 PM  Result Value Ref Range   Glucose-Capillary 146 (H) 70 - 99 mg/dL  CBC     Status: None   Collection Time: 10/23/14  7:10 AM  Result Value Ref Range   WBC 4.4 4.0 - 10.5 K/uL   RBC 4.38 3.87 - 5.11 MIL/uL   Hemoglobin 12.4 12.0 - 15.0 g/dL   HCT 37.4 36.0 - 46.0 %   MCV 85.4 78.0 - 100.0 fL   MCH 28.3 26.0 - 34.0 pg   MCHC 33.2 30.0 - 36.0 g/dL   RDW 15.0 11.5 - 15.5 %   Platelets 185 150 - 400 K/uL  Comprehensive metabolic panel     Status: Abnormal   Collection Time: 10/23/14  7:10 AM  Result Value Ref Range   Sodium 139 135 - 145 mmol/L   Potassium 4.2 3.5 - 5.1 mmol/L   Chloride 106 96 - 112 mmol/L   CO2 23 19 - 32 mmol/L   Glucose, Bld 160 (H) 70 - 99 mg/dL   BUN 12 6 - 23 mg/dL   Creatinine, Ser 0.60 0.50 - 1.10 mg/dL   Calcium 9.2 8.4 - 10.5 mg/dL   Total Protein 6.1 6.0 - 8.3 g/dL   Albumin 2.9 (L) 3.5 - 5.2 g/dL   AST 109 (H) 0 - 37 U/L   ALT 264 (H) 0 - 35 U/L   Alkaline Phosphatase 90 39 - 117 U/L   Total Bilirubin 0.8 0.3 - 1.2 mg/dL   GFR calc non Af Amer 88 (L) >90 mL/min   GFR calc Af Amer >90 >90 mL/min    Comment: (NOTE) The eGFR has been calculated using the CKD EPI equation. This calculation has not been validated in all clinical situations. eGFR's persistently <90 mL/min signify possible Chronic Kidney Disease.    Anion gap 10 5 - 15  Glucose, capillary     Status: Abnormal   Collection Time: 10/23/14  8:08 AM  Result Value Ref Range   Glucose-Capillary 130 (H) 70 - 99 mg/dL    Imaging / Studies: Ct Head Wo Contrast  10/22/2014  CLINICAL DATA:  Acute CVA, slurred speech.  Elevated blood pressure.  EXAM: CT HEAD WITHOUT CONTRAST  TECHNIQUE: Contiguous axial images were obtained from the base of the skull through the vertex without intravenous contrast.  COMPARISON:  None.  FINDINGS: Chronic microvascular disease throughout the deep white matter. Old lacunar infarcts in the left thalamus and right basal ganglia, stable. No acute infarction,  hemorrhage or hydrocephalus. Old lacunar infarct in the right brainstem. No mass lesion or mass-effect. No extra-axial fluid collection. No acute calvarial abnormality.  Visualized paranasal sinuses and mastoids clear. Orbital soft tissues unremarkable.  IMPRESSION: Chronic microvascular disease. 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 No acute intracranial abnormality.   Electronically Signed   By: Rolm Baptise M.D.   On: 10/22/2014 08:21   Nm Hepatobiliary Including Gb  10/21/2014   CLINICAL DATA:  Right upper quadrant pain.  EXAM: NUCLEAR MEDICINE HEPATOBILIARY IMAGING WITH GALLBLADDER EF  TECHNIQUE: Sequential images of the abdomen were obtained out to 60 minutes following intravenous administration of radiopharmaceutical. After slow intravenous infusion of 1.5 micrograms Cholecystokinin, gallbladder ejection fraction was determined.  RADIOPHARMACEUTICALS:  5.0 [place] [id number] Choletec  COMPARISON:  None  FINDINGS: There is immediate homogeneous uptake of radiotracer in the liver. Filling of the gallbladder begins at 45 minutes. Radiotracer uptake is present in the small bowel at 43 minutes.  At the end of 1-hour, there is relatively poor clearance of activity from the liver.  When gallbladder filling was complete, the patient was given an IV infusion of CCK. The gallbladder ejection fraction measures 1.3%. At 45 min, normal ejection fraction is greater than 40%.  The patient did not experience symptoms during CCK infusion.  IMPRESSION: 1. No biliary obstruction. 2. Biliary dyskinesia. 3. Relatively poor clearance of activity from the liver as can be seen with hepatocellular disease.   Electronically Signed   By: Kathreen Devoid   On: 10/21/2014 18:23   Mr 3d Recon At Scanner  10/22/2014   CLINICAL DATA:  Subsequent encounter for 2 day history of nausea and vomiting with right upper quadrant abdominal pain. Abnormal LFTs.  EXAM: MRI ABDOMEN WITHOUT AND WITH CONTRAST (INCLUDING MRCP)  TECHNIQUE: Multiplanar  multisequence MR imaging of the abdomen was performed both before and after the administration of intravenous contrast. Heavily T2-weighted images of the biliary and pancreatic ducts were obtained, and three-dimensional MRCP images were rendered by post processing.  CONTRAST:  45m MULTIHANCE GADOBENATE DIMEGLUMINE 529 MG/ML IV SOLN  COMPARISON:  CT from 10/20/2014.  FINDINGS: Lower chest:  Unremarkable.  Hepatobiliary: . No focal abnormality is seen in the liver parenchyma. There is no intrahepatic biliary duct dilatation. No dilatation of the extrahepatic bile ducts. Layering sludge is seen in the lumen of the gallbladder in multiple tiny stones layer dependently within the sludge. Pericholecystic fluid is associated. No evidence for choledocholithiasis.  Pancreas: No focal mass lesion. No dilatation of the main duct. 2 mm cystic focus is identified in the body of the pancreas (see image 18 series 600 and image 29 series 1401).  Spleen: No splenomegaly. No focal mass lesion.  Adrenals/Urinary Tract: No adrenal nodule or mass. Tiny cortical cysts are noted in the left kidney.  Stomach/Bowel: Stomach is nondistended. No gastric wall thickening. No evidence of outlet obstruction. Duodenum is normally positioned as is the ligament of Treitz. No small bowel dilatation within the visualized abdomen.  Vascular/Lymphatic: No abdominal aortic aneurysm no evidence for abdominal lymphadenopathy.  Other: As mentioned above, there is a tiny amount of free fluid around  the gallbladder.  Musculoskeletal: No abnormal signal within the visualized bony anatomy.  IMPRESSION: 1. Layering gallbladder sludge with tiny stones seen dependently within the gallbladder lumen. No evidence for choledocholithiasis. There is a small amount of fluid around the gallbladder as seen on the recent CT scan. 2. Tiny 2 mm cystic focus seen in the pancreatic parenchyma, most likely benign. Followup MRI in 1 year is recommended to re-evaluate. This  recommendation follows ACR consensus guidelines: Managing Incidental Findings on Abdominal CT: White Paper of the ACR Incidental Findings Committee. Joellyn Rued Radiol [phone]   Electronically Signed   By: Misty Stanley M.D.   On: 10/22/2014 14:52   Mr Jeananne Rama W/wo Cm/mrcp  10/22/2014   CLINICAL DATA:  Subsequent encounter for 2 day history of nausea and vomiting with right upper quadrant abdominal pain. Abnormal LFTs.  EXAM: MRI ABDOMEN WITHOUT AND WITH CONTRAST (INCLUDING MRCP)  TECHNIQUE: Multiplanar multisequence MR imaging of the abdomen was performed both before and after the administration of intravenous contrast. Heavily T2-weighted images of the biliary and pancreatic ducts were obtained, and three-dimensional MRCP images were rendered by post processing.  CONTRAST:  48m MULTIHANCE GADOBENATE DIMEGLUMINE 529 MG/ML IV SOLN  COMPARISON:  CT from 10/20/2014.  FINDINGS: Lower chest:  Unremarkable.  Hepatobiliary: . No focal abnormality is seen in the liver parenchyma. There is no intrahepatic biliary duct dilatation. No dilatation of the extrahepatic bile ducts. Layering sludge is seen in the lumen of the gallbladder in multiple tiny stones layer dependently within the sludge. Pericholecystic fluid is associated. No evidence for choledocholithiasis.  Pancreas: No focal mass lesion. No dilatation of the main duct. 2 mm cystic focus is identified in the body of the pancreas (see image 18 series 600 and image 29 series 1401).  Spleen: No splenomegaly. No focal mass lesion.  Adrenals/Urinary Tract: No adrenal nodule or mass. Tiny cortical cysts are noted in the left kidney.  Stomach/Bowel: Stomach is nondistended. No gastric wall thickening. No evidence of outlet obstruction. Duodenum is normally positioned as is the ligament of Treitz. No small bowel dilatation within the visualized abdomen.  Vascular/Lymphatic: No abdominal aortic aneurysm no evidence for abdominal lymphadenopathy.  Other: As mentioned  above, there is a tiny amount of free fluid around the gallbladder.  Musculoskeletal: No abnormal signal within the visualized bony anatomy.  IMPRESSION: 1. Layering gallbladder sludge with tiny stones seen dependently within the gallbladder lumen. No evidence for choledocholithiasis. There is a small amount of fluid around the gallbladder as seen on the recent CT scan. 2. Tiny 2 mm cystic focus seen in the pancreatic parenchyma, most likely benign. Followup MRI in 1 year is recommended to re-evaluate. This recommendation follows ACR consensus guidelines: Managing Incidental Findings on Abdominal CT: White Paper of the ACR Incidental Findings Committee. JJoellyn RuedRadiol [phone]  Electronically Signed   By: EMisty StanleyM.D.   On: 10/22/2014 14:52    Medications / Allergies:  Scheduled Meds: . amLODipine  2.5 mg Oral Daily  . insulin aspart  0-9 Units Subcutaneous TID WC  . piperacillin-tazobactam (ZOSYN)  IV  3.375 g Intravenous Q8H   Continuous Infusions: . 0.9 % NaCl with KCl 20 mEq / L 75 mL/hr at 10/23/14 0643   PRN Meds:.acetaminophen **OR** acetaminophen, hydrALAZINE, morphine injection, ondansetron **OR** ondansetron (ZOFRAN) IV  Antibiotics: Anti-infectives    Start     Dose/Rate Route Frequency Ordered Stop   10/21/14 0200  piperacillin-tazobactam (ZOSYN) IVPB 3.375 g     3.375  g 12.5 mL/hr over 240 Minutes Intravenous Every 8 hours 10/21/14 0043          Assessment/Plan Symptomatic cholelithiasis Biliary dyskinesia  transaminitis  -will proceed with a laparoscopic cholecystectomy with IOC today -Pt has been NPO, not on blood thinners, on zosyn -we discussed the risks of surgery including but not limited to infection, bleeding, injury to surrounding structures and anesthesia risks such as MI, stroke.  She verbalizes understanding and wishes to proceed.  Daughter was present.   -obtain consent -IVF -Jehovah's witness--no blood products   Erby Pian,  ANP-BC [employer] [facility] Pager [phone]) For consults and floor pages call [phone](7A-4:30P)  10/23/2014 9:17 AM

## 2014-10-23 NOTE — Op Note (Signed)
Laparoscopic Cholecystectomy with IOC Procedure Note  Indications: This patient presents with symptomatic gallbladder disease and will undergo laparoscopic cholecystectomy.The procedure has been discussed with the patient. Operative and non operative treatments have been discussed. Risks of surgery include bleeding, infection,  Common bile duct injury,  Injury to the stomach,liver, colon,small intestine, abdominal wall,  Diaphragm,  Major blood vessels,  And the need for an open procedure.  Other risks include worsening of medical problems, death,  DVT and pulmonary embolism, and cardiovascular events.   Medical options have also been discussed. The patient has been informed of long term expectations of surgery and non surgical options,  The patient agrees to proceed.    Pre-operative Diagnosis: Calculus of gallbladder without mention of cholecystitis or obstruction  Post-operative Diagnosis: Same  Surgeon: Ilya Ess A.   Assistants: Orson Slick RNFA  Anesthesia: General endotracheal anesthesia and Local anesthesia 0.25.% bupivacaine, with epinephrine  ASA Class: 3  Procedure Details  The patient was seen again in the Holding Room. The risks, benefits, complications, treatment options, and expected outcomes were discussed with the patient. The possibilities of reaction to medication, pulmonary aspiration, perforation of viscus, bleeding, recurrent infection, finding a normal gallbladder, the need for additional procedures, failure to diagnose a condition, the possible need to convert to an open procedure, and creating a complication requiring transfusion or operation were discussed with the patient. The patient and/or family concurred with the proposed plan, giving informed consent. The site of surgery properly noted/marked. The patient was taken to Operating Room, identified as Hannah Lamb and the procedure verified as Laparoscopic Cholecystectomy with Intraoperative Cholangiograms. A Time Out  was held and the above information confirmed.  Prior to the induction of general anesthesia, antibiotic prophylaxis was administered. General endotracheal anesthesia was then administered and tolerated well. After the induction, the abdomen was prepped in the usual sterile fashion. The patient was positioned in the supine position with the left arm comfortably tucked, along with some reverse Trendelenburg.  Local anesthetic agent was injected into the skin near the umbilicus and an incision made. The midline fascia was incised and the Hasson technique was used to introduce a 12 mm port under direct vision. It was secured with a figure of eight Vicryl suture placed in the usual fashion. Pneumoperitoneum was then created with CO2 and tolerated well without any adverse changes in the patient's vital signs. Additional trocars were introduced under direct vision with an 11 mm trocar in the epigastrium and 2 5 mm trocars in the right upper quadrant. All skin incisions were infiltrated with a local anesthetic agent before making the incision and placing the trocars.   The gallbladder was identified, the fundus grasped and retracted cephalad. Adhesions were lysed bluntly and with the electrocautery where indicated, taking care not to injure any adjacent organs or viscus. The infundibulum was grasped and retracted laterally, exposing the peritoneum overlying the triangle of Calot. This was then divided and exposed in a blunt fashion. The cystic duct was clearly identified and bluntly dissected circumferentially. The junctions of the gallbladder, cystic duct and common bile duct were clearly identified prior to the division of any linear structure.   An incision was made in the cystic duct and the cholangiogram catheter introduced. The catheter was secured using an endoclip. The study showed no stones and good visualization of the distal and proximal biliary tree. The catheter was then removed.   The cystic duct was  then  ligated with surgical clips  on the patient side and  clipped on the gallbladder side and divided. The cystic artery was identified, dissected free, ligated with clips and divided as well. Posterior cystic artery clipped and divided.  The gallbladder was dissected from the liver bed in retrograde fashion with the electrocautery. The gallbladder was removed. The liver bed was irrigated and inspected. Hemostasis was achieved with the electrocautery. Copious irrigation was utilized and was repeatedly aspirated until clear all particulate matter. Hemostasis was achieved with no signs  Of bleeding or bile leakage.  Pneumoperitoneum was completely reduced after viewing removal of the trocars under direct vision. The wound was thoroughly irrigated and the fascia was then closed with a figure of eight suture; the skin was then closed with 4 0 Monocryl and a sterile dressing was applied of Dermabond.  Instrument, sponge, and needle counts were correct at closure and at the conclusion of the case.   Findings:   Cholelithiasis  Estimated Blood Loss: Minimal         Drains: none         Total IV Fluids: 800 mL         Specimens: Gallbladder           Complications: None; patient tolerated the procedure well.         Disposition: PACU - hemodynamically stable.         Condition: stable

## 2014-10-24 ENCOUNTER — Encounter (HOSPITAL_COMMUNITY): Payer: Self-pay | Admitting: Surgery

## 2014-10-24 LAB — CBC
HEMATOCRIT: 32.6 % — AB (ref 36.0–46.0)
HEMOGLOBIN: 10.8 g/dL — AB (ref 12.0–15.0)
MCH: 28.3 pg (ref 26.0–34.0)
MCHC: 33.1 g/dL (ref 30.0–36.0)
MCV: 85.3 fL (ref 78.0–100.0)
Platelets: 165 10*3/uL (ref 150–400)
RBC: 3.82 MIL/uL — ABNORMAL LOW (ref 3.87–5.11)
RDW: 15 % (ref 11.5–15.5)
WBC: 4.7 10*3/uL (ref 4.0–10.5)

## 2014-10-24 LAB — ANA: Anti Nuclear Antibody(ANA): NEGATIVE

## 2014-10-24 LAB — COMPREHENSIVE METABOLIC PANEL
ALBUMIN: 2.9 g/dL — AB (ref 3.5–5.2)
ALK PHOS: 67 U/L (ref 39–117)
ALT: 174 U/L — AB (ref 0–35)
AST: 76 U/L — AB (ref 0–37)
Anion gap: 6 (ref 5–15)
BILIRUBIN TOTAL: 1 mg/dL (ref 0.3–1.2)
BUN: 7 mg/dL (ref 6–23)
CALCIUM: 8.7 mg/dL (ref 8.4–10.5)
CO2: 24 mmol/L (ref 19–32)
Chloride: 105 mmol/L (ref 96–112)
Creatinine, Ser: 0.52 mg/dL (ref 0.50–1.10)
GFR calc Af Amer: >90 mL/min (ref >90)
GFR calc non Af Amer: >90 mL/min (ref >90)
GLUCOSE: 147 mg/dL — AB (ref 70–99)
POTASSIUM: 3.7 mmol/L (ref 3.5–5.1)
Sodium: 135 mmol/L (ref 135–145)
Total Protein: 5.8 g/dL — ABNORMAL LOW (ref 6.0–8.3)

## 2014-10-24 LAB — GLUCOSE, CAPILLARY
GLUCOSE-CAPILLARY: 168 mg/dL — AB (ref 70–99)
Glucose-Capillary: 155 mg/dL — ABNORMAL HIGH (ref 70–99)
Glucose-Capillary: 205 mg/dL — ABNORMAL HIGH (ref 70–99)

## 2014-10-24 MED ORDER — POTASSIUM CHLORIDE IN NACL 20-0.9 MEQ/L-% IV SOLN
INTRAVENOUS | Status: DC
  Administered 2014-10-24: 21:00:00 via INTRAVENOUS
  Filled 2014-10-24 (×2): qty 1000

## 2014-10-24 NOTE — Progress Notes (Signed)
Medicare Important Message given? YES (If response is "NO", the following Medicare IM given date fields will be blank) Date Medicare IM given:10/24/2014 Medicare IM given by: Taliesin Hartlage 

## 2014-10-24 NOTE — Progress Notes (Signed)
OT Cancellation Note  Patient Details Name: KAIYAH EBER MRN: [id number] DOB: 1941/04/17   Cancelled Treatment:    Reason Eval/Treat Not Completed: Other (comment). Per PT, pt's daughter wishes to defer any therapies on acute at this time. Daughter states she has made her wishes known to Dr. Betti Cruz. Acute OT to sign off at this time.   Nena Jordan M   Carney Living, OTR/L Occupational Therapist [phone] (pager)  10/24/2014, 2:21 PM

## 2014-10-24 NOTE — Anesthesia Postprocedure Evaluation (Signed)
  Anesthesia Post-op Note  Patient: Hannah Lamb  Procedure(s) Performed: Procedure(s): LAPAROSCOPIC CHOLECYSTECTOMY WITH INTRAOPERATIVE CHOLANGIOGRAM (N/A)  Patient Location: PACU  Anesthesia Type:General  Level of Consciousness: awake, alert , oriented and patient cooperative  Airway and Oxygen Therapy: Patient Spontanous Breathing  Post-op Pain: mild  Post-op Assessment: Post-op Vital signs reviewed, Patient's Cardiovascular Status Stable, Respiratory Function Stable, Patent Airway, No signs of Nausea or vomiting and Pain level controlled  Post-op Vital Signs: stable  Last Vitals:  Filed Vitals:   10/24/14 0421  BP: 143/77  Pulse: 90  Temp: 37 C  Resp: 18    Complications: No apparent anesthesia complications

## 2014-10-24 NOTE — Progress Notes (Signed)
GASTROENTEROLOGY PROGRESS NOTE  Problem:   Elevated liver chemistries  Subjective: Feeling well one day postop from laparoscopic cholecystectomy.  Objective: Intraoperative cholangiogram was negative. There was good flow into the duodenum.  Liver chemistries today showed continued trend toward improvement, but are still moderately elevated.  Blood work for alternative causes of elevated liver chemistries is as follows:   Ferritin level is just slightly above the normal range (probably as an acute phase reactant), not high enough to worry about hemachromatosis.   Serologies for hepatitis B and hepatitis C are negative.   ANA (checking for autoimmune hepatitis) is pending.  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  Plan: Await ANA result (hopefully tomorrow).  Recheck liver chemistries tomorrow.  Hannah Lamb, M.D. 10/24/2014 12:53 PM

## 2014-10-24 NOTE — Progress Notes (Signed)
PT Cancellation Note  Patient Details Name: Hannah Lamb MRN: [id number] DOB: [birth year]   Cancelled Treatment:    Reason Eval/Treat Not Completed: Other (comment) ([name] wishes to defer any therapies on acute at this time.  Daughter states she has made her wishes known to Dr. Betti Cruz.  At this time PT will sign off per daughter's wishes. 10/24/2014  Hannah Lamb, PT [phone]  (pager)   Nainika Newlun, Eliseo Gum 10/24/2014, 11:00 AM

## 2014-10-24 NOTE — Discharge Instructions (Signed)

## 2014-10-24 NOTE — Progress Notes (Signed)
Patient ID: Hannah Lamb, female   DOB: 1941/03/24, 74 y.o.   MRN: [id number]  [facility] PROGRESS NOTE  Hannah Lamb:[id number] DOB: [birth year] DOA: 10/20/2014 PCP: Lonia Blood, MD   Brief narrative:    74 y.o. female with history of multiple sclerosis, diabetes mellitus, hypertension, presented to [facility] ED with main concern of persistent nausea and non bloody vomiting, epigastric pain that has been constant and sharp, occasionally but no consistently radiating to the back area, with no specific alleviating factors.   In ED, pt was hemodynamically stable, CT abd concerning for acute cholecystitis and given elevated LFT's, surgery team was consulted and recommended admission.  Assessment/Plan:    Acute cholecystitis with cholelithiasis - Presented with nausea, vomiting and RUQ abdominal pain. - CT scan of abdomen and pelvis with sludge and pericholecystic fluids. - MRCP notable for layering gallbladder sludge with tiny stones within the gallbladder lumen - HIDA scan 10/21/2014 showed no biliary obstruction, biliary dyskinesis, poor clearance of activity from the liver - Right upper quadrant ultrasound on 10/21/2014 with gallbladder distention with wall thickening with pericholecystic edema. - status post cholecystectomy with IOC, post op day #1, continue Zosyn day #5 - LFT's trending down  - still no flatus or BM - advanced diet, stable for d/c from surgical stand point   Elevated LFTs  - Appreciate GI input  - ANA and ferritin pending.  Slurred speech morning of 10/22/2014 - resolved  - Patient has no focal neurologic deficits on exam. Head CT is negative for any acute findings. - Serial neuro checks for 24 hours every 4 hours negative.  Diabetes mellitus type 2 uncontrolled  - Since still poor oral intake, will keep on SSI for now  Uncontrolled hypertension  - SBP in 140's this AM - continue to monitor   Hypokalemia  - supplemented and WNL this AM   Acute blood  loss anemia - post op related - no signs of active bleeding - repeat CBC in AM  Generalized weakness - ambulate   Code Status: Full.  Family Communication:  plan of care discussed with the patient Disposition Plan: Home in 1-2 days   IV access:  Peripheral IV  Procedures and diagnostic studies:     Dg Cholangiogram Operative  10/23/2014 Negative intraoperative cholangiogram.     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 No acute intracranial abnormality.     Nm Hepatobiliary Including Gb  10/21/2014   No biliary obstruction. 2. Biliary dyskinesia. 3. Relatively poor clearance of activity from the liver as can be seen with hepatocellular disease.    Ct Abdomen Pelvis W Contrast  10/20/2014  Distended gallbladder with probable sludge. Gallbladder wall thickening and edema. Free fluid in the right upper quadrant. Findings are suspicious for cholecystitis. Calcifications in the bladder probably represent bladder stones.   Mr 3d Recon At Scanner  10/22/2014   Layering gallbladder sludge with tiny stones seen dependently within the gallbladder lumen. No evidence for choledocholithiasis. There is a small amount of fluid around the gallbladder as seen on the recent CT scan.   Dg Abd Acute W/chest 10/20/2014  No evidence bowel obstruction or intraperitoneal free air. 2. Moderate to large volume stool in tiny 2 mm cystic focus seen in the pancreatic parenchyma, most likely benign.   US Abdomen RUQ 10/21/2014  Gallbladder distention and wall thickening with pericholecystic edema. Stones and sludge. Murphy's sign negative. Appearance is nonspecific but consistent with acute cholecystitis in the  appropriate clinical setting.   Medical Consultants:  Surgery  GI  Other Consultants:  None  IAnti-Infectives:   Zosyn  2/8 -->  Debbora Presto, MD  [facility] Pager [phone]  If 7PM-7AM, please contact night-coverage [email or web address] Password  [facility] 10/24/2014, 6:17 PM   LOS: 4 days   HPI/Subjective: No events overnight.   Objective: Filed Vitals:   10/24/14 0421 10/24/14 0833 10/24/14 1252 10/24/14 1255  BP: 143/77 137/60  146/57  Pulse: 90 70  77  Temp: 98.6 F (37 C) 98.8 F (37.1 C) 98.7 F (37.1 C)   TempSrc: Oral Oral Oral   Resp: 18 16 18    Height:      Weight:      SpO2: 100% 100% 98% 98%    Intake/Output Summary (Last 24 hours) at 10/24/14 1817 Last data filed at 10/24/14 1500  Gross per 24 hour  Intake    880 ml  Output      0 ml  Net    880 ml    Exam:   General:  Pt is alert, follows commands appropriately, not in acute distress  Cardiovascular: Regular rate and rhythm, no rubs, no gallops  Respiratory: Clear to auscultation bilaterally, no wheezing, no crackles, no rhonchi  Abdomen: Soft, non tender, non distended, bowel sounds present, no guarding  Extremities: No edema, pulses DP and PT palpable bilaterally  Neuro: Grossly nonfocal  Data Reviewed: Basic Metabolic Panel:  Recent Labs Lab 10/20/14 1634 10/21/14 0148 10/22/14 0849 10/23/14 0710 10/24/14 0555  NA 138 140 138 139 135  K 2.9* 3.2* 4.1 4.2 3.7  CL 101 105 104 106 105  CO2 30 30 23 23 24   GLUCOSE 247* 148* 183* 160* 147*  BUN 12 7 <5* 12 7  CREATININE 0.53 0.42* 0.59 0.60 0.52  CALCIUM 8.8 8.7 9.0 9.2 8.7  MG  --  1.5  --   --   --    Liver Function Tests:  Recent Labs Lab 10/20/14 1634 10/21/14 0148 10/22/14 0849 10/23/14 0710 10/24/14 0555  AST 585* 836* 269* 109* 76*  ALT 305* 589* 417* 264* 174*  ALKPHOS 70 93 108 90 67  BILITOT 1.2 2.5* 1.7* 0.8 1.0  PROT 6.7 6.9 6.7 6.1 5.8*  ALBUMIN 3.0* 3.1* 3.2* 2.9* 2.9*    Recent Labs Lab 10/20/14 1634  LIPASE 26   CBC:  Recent Labs Lab 10/20/14 1634 10/21/14 0148 10/22/14 0849 10/23/14 0710 10/24/14 0555  WBC 6.8 5.0 4.8 4.4 4.7  NEUTROABS 5.7 3.2  --   --   --   HGB 12.9 13.2 13.5 12.4 10.8*  HCT 37.0 38.4 39.8 37.4 32.6*  MCV 81.9 81.9  84.3 85.4 85.3  PLT 183 188 184 185 165   CBG:  Recent Labs Lab 10/23/14 1246 10/23/14 1645 10/23/14 2205 10/24/14 1224 10/24/14 1634  GLUCAP 156* 150* 160* 155* 168*   Scheduled Meds: . amLODipine  5 mg Oral Daily  . antiseptic oral rinse  7 mL Mouth Rinse BID  . insulin aspart  0-9 Units Subcutaneous TID WC  . piperacillin-tazobactam (ZOSYN)  IV  3.375 g Intravenous Q8H   Continuous Infusions: . 0.9 % NaCl with KCl 20 mEq / L 75 mL/hr at 10/23/14 0643  . lactated ringers 10 mL/hr (10/23/14 1200)

## 2014-10-24 NOTE — Care Management Note (Addendum)
    Page 1 of 1   10/24/2014     6:12:31 PM CARE MANAGEMENT NOTE 10/24/2014  Patient:  Hannah Lamb, Hannah Lamb   Account Number:  [id number]  Date Initiated:  10/21/2014  Documentation initiated by:  [facility]  Subjective/Objective Assessment:   Hypokalemia,  Acute cholecystitis  admit -  lives with daughter who is her caregiver 24 hrs.     Action/Plan:   pt eval- mobility if family will allow.   Anticipated DC Date:  10/25/2014   Anticipated DC Plan:  HOME W HOME HEALTH SERVICES      DC Planning Services  CM consult      Choice offered to / List presented to:  C-4 Adult Children           Status of service:  Completed, signed off Medicare Important Message given?  YES (If response is "NO", the following Medicare IM given date fields will be blank) Date Medicare IM given:  10/24/2014 Medicare IM given by:  Gae Gallop Date Additional Medicare IM given:   Additional Medicare IM given by:    Discharge Disposition:  HOME/SELF CARE  Per UR Regulation:  Reviewed for med. necessity/level of care/duration of stay  If discussed at Long Length of Stay Meetings, dates discussed:    Comments:  10/24/14 1639 Letha Cape RN, BSN [phone] patient lives with daughter, who provides 24 hr care, daughter states they do not need hh services at this time, she provides everything that her mother needs.  NCM left agency choice form in room with daughter in case she chages her mind.  10/21/2014 1600 Utilization review complete. Chart reviewed. Isidoro Donning RN CCM Case Mgmt phone [phone]

## 2014-10-24 NOTE — Progress Notes (Signed)
Patient ID: Hannah Lamb, female   DOB: 04-30-1941, 74 y.o.   MRN: [id number]     [address] [place], [place] [address]    Phone: [phone] FAX: [phone]     Subjective: Does not ambulate at baseline.  Tolerating clears. No flatus yet.  Mild pain.  VSS.  Afebrile.  LFTs improved.    Objective:  Vital signs:  Filed Vitals:   10/23/14 2233 10/24/14 0100 10/24/14 0421 10/24/14 0833  BP: 169/87 151/67 143/77 137/60  Pulse: 95 88 90 70  Temp: 98.6 F (37 C) 99 F (37.2 C) 98.6 F (37 C) 98.8 F (37.1 C)  TempSrc: Oral Oral Oral Oral  Resp: $Remo'20 18 18 16  'RwBbk$ Height:      Weight:      SpO2: 100% 100% 100% 100%    Last BM Date: 10/21/14  Intake/Output   Yesterday:  02/11 0701 - 02/12 0700 In: 0370 [P.O.:400; I.V.:1070; IV Piggyback:100] Out: -  This shift: I/O last 3 completed shifts: In: 2520 [P.O.:400; I.V.:1970; IV Piggyback:150] Out: -     Physical Exam: General: Pt awake/alert/oriented x4 in no acute distress  Abdomen: Soft.  Nondistended.  Mildly tender at incisions only.  No evidence of peritonitis.  No incarcerated hernias.    Problem List:   Principal Problem:   Cholecystitis Active Problems:   Hypertension   Diabetes mellitus type 2, controlled   Hypokalemia   Acute cholecystitis    Results:   Labs: Results for orders placed or performed during the hospital encounter of 10/20/14 (from the past 48 hour(s))  Glucose, capillary     Status: Abnormal   Collection Time: 10/22/14 12:07 PM  Result Value Ref Range   Glucose-Capillary 137 (H) 70 - 99 mg/dL  Glucose, capillary     Status: Abnormal   Collection Time: 10/22/14  5:04 PM  Result Value Ref Range   Glucose-Capillary 130 (H) 70 - 99 mg/dL  Glucose, capillary     Status: Abnormal   Collection Time: 10/22/14  7:46 PM  Result Value Ref Range   Glucose-Capillary 146 (H) 70 - 99 mg/dL  CBC     Status: None   Collection  Time: 10/23/14  7:10 AM  Result Value Ref Range   WBC 4.4 4.0 - 10.5 K/uL   RBC 4.38 3.87 - 5.11 MIL/uL   Hemoglobin 12.4 12.0 - 15.0 g/dL   HCT 37.4 36.0 - 46.0 %   MCV 85.4 78.0 - 100.0 fL   MCH 28.3 26.0 - 34.0 pg   MCHC 33.2 30.0 - 36.0 g/dL   RDW 15.0 11.5 - 15.5 %   Platelets 185 150 - 400 K/uL  Comprehensive metabolic panel     Status: Abnormal   Collection Time: 10/23/14  7:10 AM  Result Value Ref Range   Sodium 139 135 - 145 mmol/L   Potassium 4.2 3.5 - 5.1 mmol/L   Chloride 106 96 - 112 mmol/L   CO2 23 19 - 32 mmol/L   Glucose, Bld 160 (H) 70 - 99 mg/dL   BUN 12 6 - 23 mg/dL   Creatinine, Ser 0.60 0.50 - 1.10 mg/dL   Calcium 9.2 8.4 - 10.5 mg/dL   Total Protein 6.1 6.0 - 8.3 g/dL   Albumin 2.9 (L) 3.5 - 5.2 g/dL   AST 109 (H) 0 - 37 U/L   ALT 264 (H) 0 - 35 U/L   Alkaline Phosphatase  90 39 - 117 U/L   Total Bilirubin 0.8 0.3 - 1.2 mg/dL   GFR calc non Af Amer 88 (L) >90 mL/min   GFR calc Af Amer >90 >90 mL/min    Comment: (NOTE) The eGFR has been calculated using the CKD EPI equation. This calculation has not been validated in all clinical situations. eGFR's persistently <90 mL/min signify possible Chronic Kidney Disease.    Anion gap 10 5 - 15  Hepatitis C antibody     Status: None   Collection Time: 10/23/14  7:10 AM  Result Value Ref Range   HCV Ab NEGATIVE NEGATIVE    Comment: Performed at [facility]  Hepatitis B surface antigen     Status: None   Collection Time: 10/23/14  7:10 AM  Result Value Ref Range   Hepatitis B Surface Ag NEGATIVE NEGATIVE    Comment: Performed at [facility]  Ferritin     Status: Abnormal   Collection Time: 10/23/14  7:10 AM  Result Value Ref Range   Ferritin 316 (H) 10 - 291 ng/mL    Comment: Performed at [facility]  Glucose, capillary     Status: Abnormal   Collection Time: 10/23/14  8:08 AM  Result Value Ref Range   Glucose-Capillary 130 (H) 70 - 99 mg/dL  Glucose, capillary     Status:  Abnormal   Collection Time: 10/23/14 10:08 AM  Result Value Ref Range   Glucose-Capillary 143 (H) 70 - 99 mg/dL  Glucose, capillary     Status: Abnormal   Collection Time: 10/23/14 12:46 PM  Result Value Ref Range   Glucose-Capillary 156 (H) 70 - 99 mg/dL  Glucose, capillary     Status: Abnormal   Collection Time: 10/23/14  4:45 PM  Result Value Ref Range   Glucose-Capillary 150 (H) 70 - 99 mg/dL  Glucose, capillary     Status: Abnormal   Collection Time: 10/23/14 10:05 PM  Result Value Ref Range   Glucose-Capillary 160 (H) 70 - 99 mg/dL  CBC     Status: Abnormal   Collection Time: 10/24/14  5:55 AM  Result Value Ref Range   WBC 4.7 4.0 - 10.5 K/uL   RBC 3.82 (L) 3.87 - 5.11 MIL/uL   Hemoglobin 10.8 (L) 12.0 - 15.0 g/dL   HCT 32.6 (L) 36.0 - 46.0 %   MCV 85.3 78.0 - 100.0 fL   MCH 28.3 26.0 - 34.0 pg   MCHC 33.1 30.0 - 36.0 g/dL   RDW 15.0 11.5 - 15.5 %   Platelets 165 150 - 400 K/uL  Comprehensive metabolic panel     Status: Abnormal   Collection Time: 10/24/14  5:55 AM  Result Value Ref Range   Sodium 135 135 - 145 mmol/L   Potassium 3.7 3.5 - 5.1 mmol/L   Chloride 105 96 - 112 mmol/L   CO2 24 19 - 32 mmol/L   Glucose, Bld 147 (H) 70 - 99 mg/dL   BUN 7 6 - 23 mg/dL   Creatinine, Ser 0.52 0.50 - 1.10 mg/dL   Calcium 8.7 8.4 - 10.5 mg/dL   Total Protein 5.8 (L) 6.0 - 8.3 g/dL   Albumin 2.9 (L) 3.5 - 5.2 g/dL   AST 76 (H) 0 - 37 U/L   ALT 174 (H) 0 - 35 U/L   Alkaline Phosphatase 67 39 - 117 U/L   Total Bilirubin 1.0 0.3 - 1.2 mg/dL   GFR calc non Af Amer >90 >90 mL/min  GFR calc Af Amer >90 >90 mL/min    Comment: (NOTE) The eGFR has been calculated using the CKD EPI equation. This calculation has not been validated in all clinical situations. eGFR's persistently <90 mL/min signify possible Chronic Kidney Disease.    Anion gap 6 5 - 15    Imaging / Studies: Dg Cholangiogram Operative  10/23/2014   CLINICAL DATA:  74 year old female with acute cholecystitis.   EXAM: INTRAOPERATIVE CHOLANGIOGRAM  TECHNIQUE: Cholangiographic images from the C-arm fluoroscopic device were submitted for interpretation post-operatively. Please see the procedural report for the amount of contrast and the fluoroscopy time utilized.  COMPARISON:  MRCP 10/22/2014  FINDINGS: Single cine loop and spot image obtained at the time of intraoperative cholangiogram during laparoscopic cholecystectomy. The images demonstrate cannulation of the cystic duct remnant with opacification of the intra and extrahepatic biliary tree. No evidence of biliary ductal dilatation, stenosis, stricture or filling defect. Contrast material passes freely through the ampulla and into the duodenum.  IMPRESSION: Negative intraoperative cholangiogram.   Electronically Signed   By: Jacqulynn Cadet M.D.   On: 10/23/2014 16:18   Mr 3d Recon At Scanner  10/22/2014   CLINICAL DATA:  Subsequent encounter for 2 day history of nausea and vomiting with right upper quadrant abdominal pain. Abnormal LFTs.  EXAM: MRI ABDOMEN WITHOUT AND WITH CONTRAST (INCLUDING MRCP)  TECHNIQUE: Multiplanar multisequence MR imaging of the abdomen was performed both before and after the administration of intravenous contrast. Heavily T2-weighted images of the biliary and pancreatic ducts were obtained, and three-dimensional MRCP images were rendered by post processing.  CONTRAST:  24mL MULTIHANCE GADOBENATE DIMEGLUMINE 529 MG/ML IV SOLN  COMPARISON:  CT from 10/20/2014.  FINDINGS: Lower chest:  Unremarkable.  Hepatobiliary: . No focal abnormality is seen in the liver parenchyma. There is no intrahepatic biliary duct dilatation. No dilatation of the extrahepatic bile ducts. Layering sludge is seen in the lumen of the gallbladder in multiple tiny stones layer dependently within the sludge. Pericholecystic fluid is associated. No evidence for choledocholithiasis.  Pancreas: No focal mass lesion. No dilatation of the main duct. 2 mm cystic focus is  identified in the body of the pancreas (see image 18 series 600 and image 29 series 1401).  Spleen: No splenomegaly. No focal mass lesion.  Adrenals/Urinary Tract: No adrenal nodule or mass. Tiny cortical cysts are noted in the left kidney.  Stomach/Bowel: Stomach is nondistended. No gastric wall thickening. No evidence of outlet obstruction. Duodenum is normally positioned as is the ligament of Treitz. No small bowel dilatation within the visualized abdomen.  Vascular/Lymphatic: No abdominal aortic aneurysm no evidence for abdominal lymphadenopathy.  Other: As mentioned above, there is a tiny amount of free fluid around the gallbladder.  Musculoskeletal: No abnormal signal within the visualized bony anatomy.  IMPRESSION: 1. Layering gallbladder sludge with tiny stones seen dependently within the gallbladder lumen. No evidence for choledocholithiasis. There is a small amount of fluid around the gallbladder as seen on the recent CT scan. 2. Tiny 2 mm cystic focus seen in the pancreatic parenchyma, most likely benign. Followup MRI in 1 year is recommended to re-evaluate. This recommendation follows ACR consensus guidelines: Managing Incidental Findings on Abdominal CT: White Paper of the ACR Incidental Findings Committee. Joellyn Rued Radiol [phone]   Electronically Signed   By: Misty Stanley M.D.   On: 10/22/2014 14:52   Mr Jeananne Rama W/wo Cm/mrcp  10/22/2014   CLINICAL DATA:  Subsequent encounter for 2 day history of nausea and vomiting with  right upper quadrant abdominal pain. Abnormal LFTs.  EXAM: MRI ABDOMEN WITHOUT AND WITH CONTRAST (INCLUDING MRCP)  TECHNIQUE: Multiplanar multisequence MR imaging of the abdomen was performed both before and after the administration of intravenous contrast. Heavily T2-weighted images of the biliary and pancreatic ducts were obtained, and three-dimensional MRCP images were rendered by post processing.  CONTRAST:  36mL MULTIHANCE GADOBENATE DIMEGLUMINE 529 MG/ML IV SOLN   COMPARISON:  CT from 10/20/2014.  FINDINGS: Lower chest:  Unremarkable.  Hepatobiliary: . No focal abnormality is seen in the liver parenchyma. There is no intrahepatic biliary duct dilatation. No dilatation of the extrahepatic bile ducts. Layering sludge is seen in the lumen of the gallbladder in multiple tiny stones layer dependently within the sludge. Pericholecystic fluid is associated. No evidence for choledocholithiasis.  Pancreas: No focal mass lesion. No dilatation of the main duct. 2 mm cystic focus is identified in the body of the pancreas (see image 18 series 600 and image 29 series 1401).  Spleen: No splenomegaly. No focal mass lesion.  Adrenals/Urinary Tract: No adrenal nodule or mass. Tiny cortical cysts are noted in the left kidney.  Stomach/Bowel: Stomach is nondistended. No gastric wall thickening. No evidence of outlet obstruction. Duodenum is normally positioned as is the ligament of Treitz. No small bowel dilatation within the visualized abdomen.  Vascular/Lymphatic: No abdominal aortic aneurysm no evidence for abdominal lymphadenopathy.  Other: As mentioned above, there is a tiny amount of free fluid around the gallbladder.  Musculoskeletal: No abnormal signal within the visualized bony anatomy.  IMPRESSION: 1. Layering gallbladder sludge with tiny stones seen dependently within the gallbladder lumen. No evidence for choledocholithiasis. There is a small amount of fluid around the gallbladder as seen on the recent CT scan. 2. Tiny 2 mm cystic focus seen in the pancreatic parenchyma, most likely benign. Followup MRI in 1 year is recommended to re-evaluate. This recommendation follows ACR consensus guidelines: Managing Incidental Findings on Abdominal CT: White Paper of the ACR Incidental Findings Committee. Joellyn Rued Radiol [phone]   Electronically Signed   By: Misty Stanley M.D.   On: 10/22/2014 14:52    Medications / Allergies:  Scheduled Meds: . amLODipine  5 mg Oral Daily  .  antiseptic oral rinse  7 mL Mouth Rinse BID  . insulin aspart  0-9 Units Subcutaneous TID WC  . piperacillin-tazobactam (ZOSYN)  IV  3.375 g Intravenous Q8H   Continuous Infusions: . 0.9 % NaCl with KCl 20 mEq / L 75 mL/hr at 10/23/14 0643  . lactated ringers 10 mL/hr (10/23/14 1200)   PRN Meds:.acetaminophen **OR** acetaminophen, hydrALAZINE, morphine injection, ondansetron **OR** ondansetron (ZOFRAN) IV  Antibiotics: Anti-infectives    Start     Dose/Rate Route Frequency Ordered Stop   10/21/14 0200  piperacillin-tazobactam (ZOSYN) IVPB 3.375 g     3.375 g 12.5 mL/hr over 240 Minutes Intravenous Every 8 hours 10/21/14 0043         Assessment/Plan POD#1 laparoscopic cholecystectomy with IOC---Dr. Brantley Stage -IOC negative, pain controlled, afebrile, tolerated clears, advance as tolerated.  Does not mobilize at bedside.  LFTs improving, normal T bili. -stable for DC from surgical standpoint -follow up arranged, instructions provid  Erby Pian, ANP-BC [facility] Pager [phone](7A-4:30P) For consults and floor pages call [phone](7A-4:30P)  10/24/2014 9:28 AM

## 2014-10-25 LAB — CBC
HCT: 31 % — ABNORMAL LOW (ref 36.0–46.0)
Hemoglobin: 10.3 g/dL — ABNORMAL LOW (ref 12.0–15.0)
MCH: 28.5 pg (ref 26.0–34.0)
MCHC: 33.2 g/dL (ref 30.0–36.0)
MCV: 85.9 fL (ref 78.0–100.0)
Platelets: 149 10*3/uL — ABNORMAL LOW (ref 150–400)
RBC: 3.61 MIL/uL — ABNORMAL LOW (ref 3.87–5.11)
RDW: 14.8 % (ref 11.5–15.5)
WBC: 4.6 10*3/uL (ref 4.0–10.5)

## 2014-10-25 LAB — GLUCOSE, CAPILLARY
GLUCOSE-CAPILLARY: 247 mg/dL — AB (ref 70–99)
GLUCOSE-CAPILLARY: 139 mg/dL — AB (ref 70–99)

## 2014-10-25 LAB — COMPREHENSIVE METABOLIC PANEL
ALT: 130 U/L — ABNORMAL HIGH (ref 0–35)
AST: 57 U/L — ABNORMAL HIGH (ref 0–37)
Albumin: 2.6 g/dL — ABNORMAL LOW (ref 3.5–5.2)
Alkaline Phosphatase: 58 U/L (ref 39–117)
Anion gap: 4 — ABNORMAL LOW (ref 5–15)
BILIRUBIN TOTAL: 0.6 mg/dL (ref 0.3–1.2)
BUN: 7 mg/dL (ref 6–23)
CHLORIDE: 104 mmol/L (ref 96–112)
CO2: 29 mmol/L (ref 19–32)
Calcium: 8.3 mg/dL — ABNORMAL LOW (ref 8.4–10.5)
Creatinine, Ser: 0.5 mg/dL (ref 0.50–1.10)
GLUCOSE: 147 mg/dL — AB (ref 70–99)
Potassium: 3.7 mmol/L (ref 3.5–5.1)
SODIUM: 137 mmol/L (ref 135–145)
TOTAL PROTEIN: 5.4 g/dL — AB (ref 6.0–8.3)

## 2014-10-25 MED ORDER — TRAMADOL HCL 50 MG PO TABS: 50.0000 mg | ORAL_TABLET | Freq: Four times a day (QID) | ORAL | Status: DC | PRN

## 2014-10-25 MED ORDER — SENNOSIDES 8.8 MG/5ML PO SYRP
5.0000 mL | ORAL_SOLUTION | Freq: Two times a day (BID) | ORAL | Status: DC
Start: 2014-10-25 — End: 2014-10-25
  Filled 2014-10-25 (×2): qty 5

## 2014-10-25 MED ORDER — CIPROFLOXACIN HCL 500 MG PO TABS: 500.0000 mg | ORAL_TABLET | Freq: Two times a day (BID) | ORAL | Status: DC

## 2014-10-25 MED ORDER — POLYETHYLENE GLYCOL 3350 17 G PO PACK
17.0000 g | PACK | Freq: Every day | ORAL | Status: DC
Start: 2014-10-25 — End: 2014-10-25
  Administered 2014-10-25: 17 g via ORAL
  Filled 2014-10-25: qty 1

## 2014-10-25 NOTE — Progress Notes (Signed)
Discharge information including prescriptions, F/U appointments, post-op information, p/o precautions, incision care, and preventative care discussed with patient and daughter. Ascertained understanding of information via teach-back method.   Pt. Discharged via [facility] ambulance service to daughter's residence via gurney.  Daughter to follow in private car.

## 2014-10-25 NOTE — Discharge Summary (Signed)
Physician Discharge Summary  JAYMI TINNER UJW:[id number] DOB: [birth year] DOA: 10/20/2014  PCP: Lonia Blood, MD  Admit date: 10/20/2014 Discharge date: 10/25/2014  Recommendations for Outpatient Follow-up:  1. Pt will need to follow up with PCP in 2-3 weeks post discharge 2. Please obtain BMP to evaluate electrolytes and kidney function 3. Please also check CBC to evaluate Hg and Hct levels 4. Pt advised to schedule an appointment with surgery for follow up   Discharge Diagnoses:  Principal Problem:   Cholecystitis Active Problems:   Hypertension   Diabetes mellitus type 2, controlled   Hypokalemia   Acute cholecystitis   Discharge Condition: Stable  Diet recommendation: Heart healthy diet discussed in details     Brief narrative:    74 y.o. female with history of multiple sclerosis, diabetes mellitus, hypertension, presented to [facility] ED with main concern of persistent nausea and non bloody vomiting, epigastric pain that has been constant and sharp, occasionally but no consistently radiating to the back area, with no specific alleviating factors.   In ED, pt was hemodynamically stable, CT abd concerning for acute cholecystitis and given elevated LFT's, surgery team was consulted and recommended admission.  Assessment/Plan:    Acute cholecystitis with cholelithiasis - Presented with nausea, vomiting and RUQ abdominal pain. - CT scan of abdomen and pelvis with sludge and pericholecystic fluids. - MRCP notable for layering gallbladder sludge with tiny stones within the gallbladder lumen - HIDA scan 10/21/2014 showed no biliary obstruction, biliary dyskinesis, poor clearance of activity from the liver - Right upper quadrant ultrasound on 10/21/2014 with gallbladder distention with wall thickening with pericholecystic edema. - status post cholecystectomy with IOC, post op day #2, continue Zosyn day #6 and transition to oral Cipro upon discharge for three more days  - LFT's  trending down  - passing flatus and had BM this AM - advanced diet, stable for d/c from surgical and GI stand point   Elevated LFTs  - Appreciate GI input   Slurred speech morning of 10/22/2014 - resolved  - Patient has no focal neurologic deficits on exam. Head CT is negative for any acute findings. - Serial neuro checks for 24 hours every 4 hours negative.  Diabetes mellitus type 2 uncontrolled  - reasonable inpatient control   Uncontrolled hypertension  - continue home medical regimen upon discharge   Hypokalemia  - supplemented and WNL this AM  Acute blood loss anemia - post op related, Hg stable over the past 24 hours  - no signs of active bleeding  Generalized weakness - ambulating   Code Status: Full.  Family Communication: plan of care discussed with the patient and daughter at bedside  Disposition Plan: Home today   IV access:  Peripheral IV  Procedures and diagnostic studies:    Dg Cholangiogram Operative 10/23/2014 Negative intraoperative cholangiogram.   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 No acute intracranial abnormality.   Nm Hepatobiliary Including Gb 10/21/2014 No biliary obstruction. 2. Biliary dyskinesia. 3. Relatively poor clearance of activity from the liver as can be seen with hepatocellular disease.   Ct Abdomen Pelvis W Contrast 10/20/2014 Distended gallbladder with probable sludge. Gallbladder wall thickening and edema. Free fluid in the right upper quadrant. Findings are suspicious for cholecystitis. Calcifications in the bladder probably represent bladder stones.   Mr 3d Recon At Scanner 10/22/2014 Layering gallbladder sludge with tiny stones seen dependently within the gallbladder lumen. No evidence for choledocholithiasis. There is a small amount of fluid around  the gallbladder as seen on the recent CT scan.   Dg Abd Acute W/chest 10/20/2014 No evidence bowel  obstruction or intraperitoneal free air. 2. Moderate to large volume stool in tiny 2 mm cystic focus seen in the pancreatic parenchyma, most likely benign.   US Abdomen RUQ 10/21/2014 Gallbladder distention and wall thickening with pericholecystic edema. Stones and sludge. Murphy's sign negative. Appearance is nonspecific but consistent with acute cholecystitis in the appropriate clinical setting.   Medical Consultants:  Surgery  GI  Other Consultants:  None  IAnti-Infectives:   Zosyn 2/8 --> 2/13 Cipro 2/13 --> 3 more days post discharge        Discharge Exam: Filed Vitals:   10/25/14 0649  BP: 149/68  Pulse: 69  Temp:   Resp:    Filed Vitals:   10/24/14 1255 10/24/14 2108 10/25/14 0506 10/25/14 0649  BP: 146/57 160/75 174/74 149/68  Pulse:   77 69  Temp:  99 F (37.2 C) 98.4 F (36.9 C)   TempSrc:  Oral Oral   Resp:  20 20   Height:      Weight:      SpO2: 98% 100% 100% 95%    General: Pt is alert, follows commands appropriately, not in acute distress Cardiovascular: Regular rate and rhythm, no rubs, no gallops Respiratory: Clear to auscultation bilaterally, no wheezing, no crackles, no rhonchi Abdominal: Soft, non tender, non distended, bowel sounds +, no guarding Extremities: no cyanosis, pulses palpable bilaterally DP and PT Neuro: Grossly nonfocal  Discharge Instructions  Discharge Instructions    Diet - low sodium heart healthy    Complete by:  As directed      Increase activity slowly    Complete by:  As directed             Medication List    TAKE these medications        amLODipine 10 MG tablet  Commonly known as:  NORVASC  Take 1 tablet (10 mg total) by mouth daily.     ciprofloxacin 500 MG tablet  Commonly known as:  CIPRO  Take 1 tablet (500 mg total) by mouth 2 (two) times daily.     HUMULIN 70/30 (70-30) 100 UNIT/ML injection  Generic drug:  insulin NPH-regular Human  Inject 15 Units into the skin daily.     irbesartan  300 MG tablet  Commonly known as:  AVAPRO  Take 1 tablet (300 mg total) by mouth daily.     potassium chloride 10 MEQ tablet  Commonly known as:  K-DUR  Take 10 mEq by mouth daily.     traMADol 50 MG tablet  Commonly known as:  ULTRAM  Take 1 tablet (50 mg total) by mouth every 6 (six) hours as needed.           Follow-up Information    Follow up with [facility] [place] On 11/11/2014.   Why:  arrive by 3:45PM for a 4:15PM post op check   Contact information:   [address] [place] [address] [phone]      Follow up with Lonia Blood, MD.   Specialty:  Internal Medicine   Contact information:   [address]  [place] [address] [phone]       Follow up with Debbora Presto, MD.   Specialty:  Internal Medicine   Why:  As needed call my cell phone [phone]   Contact information:   [address] [place]  [address] [phone]        The results of significant diagnostics from this hospitalization (including imaging, microbiology, ancillary and laboratory) are listed below for reference.     Microbiology: No results found for this or any previous visit (from the past 240 hour(s)).   Labs: Basic Metabolic Panel:  Recent Labs Lab 10/21/14 0148 10/22/14 0849 10/23/14 0710 10/24/14 0555 10/25/14 0441  NA 140 138 139 135 137  K 3.2* 4.1 4.2 3.7 3.7  CL 105 104 106 105 104  CO2 GLUCOSE 148* 183* 160* 147* 147*  BUN 7 <5* CREATININE 0.42* 0.59 0.60 0.52 0.50  CALCIUM 8.7 9.0 9.2 8.7 8.3*  MG 1.5  --   --   --   --    Liver Function Tests:  Recent Labs Lab 10/21/14 0148 10/22/14 0849 10/23/14 0710 10/24/14 0555 10/25/14 0441  AST 836* 269* 109* 76* 57*  ALT 589* 417* 264* 174* 130*  ALKPHOS 93 108 90 67 58  BILITOT 2.5* 1.7* 0.8 1.0 0.6  PROT 6.9 6.7 6.1 5.8* 5.4*  ALBUMIN 3.1* 3.2* 2.9* 2.9* 2.6*    Recent Labs Lab 10/20/14 1634  LIPASE  26   CBC:  Recent Labs Lab 10/20/14 1634 10/21/14 0148 10/22/14 0849 10/23/14 0710 10/24/14 0555 10/25/14 0441  WBC 6.8 5.0 4.8 4.4 4.7 4.6  NEUTROABS 5.7 3.2  --   --   --   --   HGB 12.9 13.2 13.5 12.4 10.8* 10.3*  HCT 37.0 38.4 39.8 37.4 32.6* 31.0*  MCV 81.9 81.9 84.3 85.4 85.3 85.9  PLT 183 188 184 185 165 149*   CBG:  Recent Labs Lab 10/23/14 1645 10/23/14 2205 10/24/14 1224 10/24/14 1634 10/24/14 2104  GLUCAP 150* 160* 155* 168* 205*   SIGNED: Time coordinating discharge: Over 30 minutes  Debbora Presto, MD  [facility] 10/25/2014, 10:07 AM Pager [phone]  If 7PM-7AM, please contact night-coverage [email or web address] Password [email or web address]

## 2014-10-25 NOTE — Progress Notes (Signed)
Subjective: No abdominal pain. Tolerating diet.  Objective: Vital signs in last 24 hours: Temp:  [98.4 F (36.9 C)-99 F (37.2 C)] 98.4 F (36.9 C) (02/13 0506) Pulse Rate:  [69-80] 69 (02/13 0649) Resp:  [20] 20 (02/13 0506) BP: (149-174)/(68-75) 149/68 mmHg (02/13 0649) SpO2:  [95 %-100 %] 95 % (02/13 0649) Weight change:  Last BM Date: 10/21/14  PE: GEN:  NAD ABD:  Soft, non-tender  Lab Results: CMP     Component Value Date/Time   NA 137 10/25/2014 0441   K 3.7 10/25/2014 0441   CL 104 10/25/2014 0441   CO2 29 10/25/2014 0441   GLUCOSE 147* 10/25/2014 0441   BUN 7 10/25/2014 0441   CREATININE 0.50 10/25/2014 0441   CALCIUM 8.3* 10/25/2014 0441   PROT 5.4* 10/25/2014 0441   ALBUMIN 2.6* 10/25/2014 0441   AST 57* 10/25/2014 0441   ALT 130* 10/25/2014 0441   ALKPHOS 58 10/25/2014 0441   BILITOT 0.6 10/25/2014 0441   GFRNONAA >90 10/25/2014 0441   GFRAA >90 10/25/2014 0441   Assessment:  1.  Elevated LFTs, downtrending, suspect reactive hepatitis from patient's gallbladder inflammation.  Hepatitis B/C negative.  ANA negative.  Plan:  1.  Advance diet as tolerated. 2.  OK to discharge home today from GI perspective; I have communicated this with Dr. Izola Price. 3.  Will need outpatient liver tests with Dr. Donavan Burnet office in 2 weeks. 4.  Will sign-off; please call with questions; thank you for the consult.   Freddy Jaksch 10/25/2014, 1:58 PM

## 2014-10-25 NOTE — Progress Notes (Signed)
[name] Progress Note 2 Days Post-Op  Subjective: Patient doing very well.  No complaints  Objective: Vital signs in last 24 hours: Temp:  [98.4 F (36.9 C)-99 F (37.2 C)] 98.4 F (36.9 C) (02/13 0506) Pulse Rate:  [69-80] 69 (02/13 0649) Resp:  [18-20] 20 (02/13 0506) BP: (146-174)/(57-75) 149/68 mmHg (02/13 0649) SpO2:  [95 %-100 %] 95 % (02/13 0649) Last BM Date: 10/21/14  Intake/Output from previous day: 02/12 0701 - 02/13 0700 In: 520 [P.O.:520] Out: -  Intake/Output this shift:    General: No acute distress.  Very pleasant.  Lungs: Clear  Abd: Excellent bowel sounds.  Completely non-tender, wounds are clean and dry  Extremities: No changes  Neuro: Intact.  Seems oriented.  Lab Results:  @LABLAST2 (wbc:2,hgb:2,hct:2,plt:2) BMET  Recent Labs  10/24/14 0555 10/25/14 0441  NA 135 137  K 3.7 3.7  CL 105 104  CO2 24 29  GLUCOSE 147* 147*  BUN 7 7  CREATININE 0.52 0.50  CALCIUM 8.7 8.3*   PT/INR No results for input(s): LABPROT, INR in the last 72 hours. ABG No results for input(s): PHART, HCO3 in the last 72 hours.  Invalid input(s): PCO2, PO2  Studies/Results: Dg Cholangiogram Operative  10/23/2014   CLINICAL DATA:  74 year old female with acute cholecystitis.  EXAM: INTRAOPERATIVE CHOLANGIOGRAM  TECHNIQUE: Cholangiographic images from the C-arm fluoroscopic device were submitted for interpretation post-operatively. Please see the procedural report for the amount of contrast and the fluoroscopy time utilized.  COMPARISON:  MRCP 10/22/2014  FINDINGS: Single cine loop and spot image obtained at the time of intraoperative cholangiogram during laparoscopic cholecystectomy. The images demonstrate cannulation of the cystic duct remnant with opacification of the intra and extrahepatic biliary tree. No evidence of biliary ductal dilatation, stenosis, stricture or filling defect. Contrast material passes freely through the ampulla and into the duodenum.   IMPRESSION: Negative intraoperative cholangiogram.   Electronically Signed   By: Hannah Lamb M.D.   On: 10/23/2014 16:18    Anti-infectives: Anti-infectives    Start     Dose/Rate Route Frequency Ordered Stop   10/21/14 0200  piperacillin-tazobactam (ZOSYN) IVPB 3.375 g     3.375 g 12.5 mL/hr over 240 Minutes Intravenous Every 8 hours 10/21/14 0043        Assessment/Plan: s/p Procedure(s): LAPAROSCOPIC CHOLECYSTECTOMY WITH INTRAOPERATIVE CHOLANGIOGRAM Advance diet Okay to go home from surgery standpoint.  LOS: 5 days   Hannah Lamb. Gae Bon, MD, FACS [phone] [facility] 10/25/2014

## 2014-10-27 LAB — GLUCOSE, CAPILLARY: Glucose-Capillary: 150 mg/dL — ABNORMAL HIGH (ref 70–99)

## 2014-11-10 ENCOUNTER — Encounter: Payer: Self-pay | Admitting: Neurology

## 2014-11-10 ENCOUNTER — Ambulatory Visit (INDEPENDENT_AMBULATORY_CARE_PROVIDER_SITE_OTHER): Payer: Medicare Other | Admitting: Neurology

## 2014-11-10 VITALS — BP 154/90 | HR 81 | Ht 68.0 in | Wt 183.0 lb

## 2014-11-10 DIAGNOSIS — G35 Multiple sclerosis: Secondary | ICD-10-CM

## 2014-11-10 DIAGNOSIS — R55 Syncope and collapse: Secondary | ICD-10-CM

## 2014-11-10 DIAGNOSIS — I639 Cerebral infarction, unspecified: Secondary | ICD-10-CM | POA: Diagnosis not present

## 2014-11-10 NOTE — Patient Instructions (Signed)
Syncope °Syncope is a medical term for fainting or passing out. This means you lose consciousness and drop to the ground. People are generally unconscious for less than 5 minutes. You may have some muscle twitches for up to 15 seconds before waking up and returning to normal. Syncope occurs more often in older adults, but it can happen to anyone. While most causes of syncope are not dangerous, syncope can be a sign of a serious medical problem. It is important to seek medical care.  °CAUSES  °Syncope is caused by a sudden drop in blood flow to the brain. The specific cause is often not determined. Factors that can bring on syncope include: °· Taking medicines that lower blood pressure. °· Sudden changes in posture, such as standing up quickly. °· Taking more medicine than prescribed. °· Standing in one place for too long. °· Seizure disorders. °· Dehydration and excessive exposure to heat. °· Low blood sugar (hypoglycemia). °· Straining to have a bowel movement. °· Heart disease, irregular heartbeat, or other circulatory problems. °· Fear, emotional distress, seeing blood, or severe pain. °SYMPTOMS  °Right before fainting, you may: °· Feel dizzy or light-headed. °· Feel nauseous. °· See all white or all black in your field of vision. °· Have cold, clammy skin. °DIAGNOSIS  °Your health care provider will ask about your symptoms, perform a physical exam, and perform an electrocardiogram (ECG) to record the electrical activity of your heart. Your health care provider may also perform other heart or blood tests to determine the cause of your syncope which may include: °· Transthoracic echocardiogram (TTE). During echocardiography, sound waves are used to evaluate how blood flows through your heart. °· Transesophageal echocardiogram (TEE). °· Cardiac monitoring. This allows your health care provider to monitor your heart rate and rhythm in real time. °· Holter monitor. This is a portable device that records your  heartbeat and can help diagnose heart arrhythmias. It allows your health care provider to track your heart activity for several days, if needed. °· Stress tests by exercise or by giving medicine that makes the heart beat faster. °TREATMENT  °In most cases, no treatment is needed. Depending on the cause of your syncope, your health care provider may recommend changing or stopping some of your medicines. °HOME CARE INSTRUCTIONS °· Have someone stay with you until you feel stable. °· Do not drive, use machinery, or play sports until your health care provider says it is okay. °· Keep all follow-up appointments as directed by your health care provider. °· Lie down right away if you start feeling like you might faint. Breathe deeply and steadily. Wait until all the symptoms have passed. °· Drink enough fluids to keep your urine clear or pale yellow. °· If you are taking blood pressure or heart medicine, get up slowly and take several minutes to sit and then stand. This can reduce dizziness. °SEEK IMMEDIATE MEDICAL CARE IF:  °· You have a severe headache. °· You have unusual pain in the chest, abdomen, or back. °· You are bleeding from your mouth or rectum, or you have black or tarry stool. °· You have an irregular or very fast heartbeat. °· You have pain with breathing. °· You have repeated fainting or seizure-like jerking during an episode. °· You faint when sitting or lying down. °· You have confusion. °· You have trouble walking. °· You have severe weakness. °· You have vision problems. °If you fainted, call your local emergency services (911 in [place] Do not drive   yourself to the hospital.  °MAKE SURE YOU: °· Understand these instructions. °· Will watch your condition. °· Will get help right away if you are not doing well or get worse. °Document Released: 08/29/2005 Document Revised: 09/03/2013 Document Reviewed: 10/28/2011 °[employer]® Patient Information ©2015 [employer]. This information is not intended to replace  advice given to you by your health care provider. Make sure you discuss any questions you have with your health care provider. ° °

## 2014-11-10 NOTE — Progress Notes (Signed)
Reason for visit: Multiple sclerosis, syncope  Hannah Lamb is a 74 y.o. female  History of present illness:  Hannah Lamb is a 74 year old right-handed black female with a history of multiple sclerosis that was diagnosed about 10 or 12 years ago. This was done at [facility]. She began having symptoms about 15 years ago with a progressive gait disturbance. She was never placed on disease modifying agents as she was felt to have a chronic progressive illness. The patient over time has lost her ability to ambulate, she has not walked in greater than 5 years. The patient last had an MRI evaluation done in 2013 showing extensive paraventricular white matter changes. A CT scan of the brain was done in February 2016 once again showing chronic white matter changes. The patient has weakness particularly of the legs with significant degenerative arthritis of the knees and knee pain. She is unable to stand even for transfers, she has to be lifted with a [employer] lift. She reports some itching sensations throughout the body that keeps her from sleeping well. She has incontinence of the bowels and the bladder. Over the last 4 or 5 years, she has had intermittent syncopal events. The episodes have been evaluated through cardiology, a 2-D echocardiogram was done in 2013 another one was ordered in October 2015, but never done. The patient has a heart murmur, but no significant valve abnormalities were seen on 2D echocardiogram. The patient indicates that she also had a prolonged cardiac monitor, and had episodes of syncope during the monitoring period, and no abnormalities of cardiac rhythm were noted. The patient indicates that the episodes generally start with pain in the buttocks area, and she will have syncope only while sitting. The patient will then have some sensation of abdominal discomfort, nausea. She will then have syncope. If she is able lay down quickly, she can avert a syncopal event. She will be clammy  during the event. The patient may have some visual dimming prior to the loss of consciousness, no tongue biting. The patient will have a sensation of needing to have a bowel movement following the episode. She recently had her gallbladder resected, and she has not had any syncopal events since that time. Otherwise, the events are essentially daily if she is sitting too long. She spends most of her time lying flat in bed.  Past Medical History  Diagnosis Date  . Hypertension   . MS (multiple sclerosis)   . Diabetes mellitus   . Goiter   . Hyperlipidemia   . Myocardial infarction   . Anxiety   . Stroke   . Syncope     recurrent  . Bilateral chronic knee pain     Past Surgical History  Procedure Laterality Date  . Abdominal hysterectomy    . Cholecystectomy N/A 10/23/2014    Procedure: LAPAROSCOPIC CHOLECYSTECTOMY WITH INTRAOPERATIVE CHOLANGIOGRAM;  Surgeon: Harriette Bouillon, MD;  Location: [facility] OR;  Service: General;  Laterality: N/A;    Family History  Problem Relation Age of Onset  . Diabetes Father     Social history:  reports that she has never smoked. She has never used smokeless tobacco. She reports that she does not drink alcohol or use illicit drugs.  Medications:  Prior to Admission medications   Medication Sig Start Date End Date Taking? Authorizing Provider  amLODipine (NORVASC) 10 MG tablet Take 1 tablet (10 mg total) by mouth daily. 01/08/13  Yes Laveda Norman, MD  HUMULIN 70/30 (70-30) 100  UNIT/ML injection Inject 15 Units into the skin daily. 03/17/14  Yes Historical Provider, MD  irbesartan (AVAPRO) 300 MG tablet Take 1 tablet (300 mg total) by mouth daily. 01/08/13  Yes Laveda Norman, MD  potassium chloride (K-DUR) 10 MEQ tablet Take 10 mEq by mouth daily.   Yes Historical Provider, MD  traMADol (ULTRAM) 50 MG tablet Take 1 tablet (50 mg total) by mouth every 6 (six) hours as needed. 10/25/14  Yes Dorothea Ogle, MD      Allergies  Allergen Reactions  . Sulfa Drugs Cross  Reactors Other (See Comments)    dizziness  . Lipitor [Atorvastatin] Other (See Comments)    Muscle weakness    ROS:  Out of a complete 14 system review of symptoms, the patient complains only of the following symptoms, and all other reviewed systems are negative.  Heart murmur, swelling in the legs Itching, moles Incontinence of bowel and bladder Increased thirst Joint pain, joint swelling Muscle weakness, dizziness, passing out  Blood pressure 154/90, pulse 81, height  (1.727 m), weight 183 lb (83.008 kg).  Physical Exam  General: The patient is alert and cooperative at the time of the examination.  Eyes: Pupils are equal, round, and reactive to light. Discs are flat bilaterally.  Neck: The neck is supple, no carotid bruits are noted.  Respiratory: The respiratory examination is clear.  Cardiovascular: The cardiovascular examination reveals a regular rate and rhythm, a grade III/VI systolic murmur is noted in the left lower sternal border.  Skin: Extremities are with 2+ edema below the knees bilaterally.  Neurologic Exam  Mental status: The patient is alert and oriented x 3 at the time of the examination. The patient has apparent normal recent and remote memory, with an apparently normal attention span and concentration ability.  Cranial nerves: Facial symmetry is present. There is good sensation of the face to pinprick and soft touch bilaterally. The strength of the facial muscles and the muscles to head turning and shoulder shrug are normal bilaterally. Speech is well enunciated, no aphasia or dysarthria is noted. Extraocular movements are full. Visual fields are full. The tongue is midline, and the patient has symmetric elevation of the soft palate. No obvious hearing deficits are noted.  Motor: The motor testing reveals 5 over 5 strength of the upper extremities. Some restriction of abduction of the left shoulder is noted. With the lower extremities, there is diffuse  3/5 strength. Good symmetric motor tone is noted throughout.  Sensory: Sensory testing is intact to vibration sensation, and position sense on all 4 extremities. Pinprick sensation is decreased on the right arm relative the left, and the left leg relative to the right. No evidence of extinction is noted.  Coordination: Cerebellar testing reveals good finger-nose-finger bilaterally. The patient is unable to perform heel-to-shin bilaterally.  Gait and station: The patient was unable to stand even with assistance. She could not be ambulated.  Reflexes: Deep tendon reflexes are symmetric, depressed bilaterally. Toes are downgoing bilaterally.   MRI brain 02/16/12:  IMPRESSION: Acute infarct involving the right caudate head and the anterior aspect of the right lenticular nucleus with smaller scattered infarcts throughout the right frontal lobe.  Remote small infarcts of the basal ganglia, thalami and cerebellum bilaterally.  Prominent white matter type changes fairly confluent in the periventricular region having progressed since prior examination. This may represent combination of result of multiple sclerosis and / or small vessel disease.  Global atrophy.  * MRI images were reviewed  online, agree with written report.   Assessment/Plan:  1. Multiple sclerosis  2. Gait disorder  3. Recurrent syncope, likely vasovagal syncope  The patient has a severe gait disorder associated with the multiple sclerosis. She is nonambulatory. She is having episodes of syncope that are preceded by a discomfort and pain in the buttocks area, spreading to nausea of the stomach, then syncope associated with diaphoresis. The patient is able to overt the syncope by lying down. She has the sensation of having a bowel movement afterwards. The episodes likely represent a vasovagal event. The patient may possibly have improved following the gallbladder resection. The patient will be set up for an EEG study,  and a carotid Doppler study. If the studies are unremarkable, and the patient continues to have syncope, an SSRI antidepressant medication may be utilized. Otherwise, the patient will follow-up in 3-4 months.  Marlan Palau MD 11/10/2014 8:10 PM  [facility] [address] [place], [place] [address]  Phone [phone] Fax [phone]

## 2014-11-13 ENCOUNTER — Telehealth: Payer: Self-pay | Admitting: Radiology

## 2014-11-13 NOTE — Telephone Encounter (Signed)
Called and left v/ message to return my call for scheduling doppler study.

## 2014-11-18 ENCOUNTER — Observation Stay (HOSPITAL_COMMUNITY)
Admission: EM | Admit: 2014-11-18 | Discharge: 2014-11-20 | Disposition: A | Discharge status: Home / Self Care | Payer: Medicare Other | Attending: Internal Medicine | Admitting: Internal Medicine

## 2014-11-18 ENCOUNTER — Encounter (HOSPITAL_COMMUNITY): Payer: Self-pay

## 2014-11-18 ENCOUNTER — Telehealth: Payer: Self-pay | Admitting: Neurology

## 2014-11-18 ENCOUNTER — Observation Stay (HOSPITAL_COMMUNITY): Payer: Medicare Other

## 2014-11-18 ENCOUNTER — Emergency Department (HOSPITAL_COMMUNITY): Payer: Medicare Other

## 2014-11-18 DIAGNOSIS — I6523 Occlusion and stenosis of bilateral carotid arteries: Secondary | ICD-10-CM | POA: Insufficient documentation

## 2014-11-18 DIAGNOSIS — Z79899 Other long term (current) drug therapy: Secondary | ICD-10-CM | POA: Insufficient documentation

## 2014-11-18 DIAGNOSIS — R55 Syncope and collapse: Principal | ICD-10-CM | POA: Diagnosis present

## 2014-11-18 DIAGNOSIS — I10 Essential (primary) hypertension: Secondary | ICD-10-CM | POA: Insufficient documentation

## 2014-11-18 DIAGNOSIS — G35 Multiple sclerosis: Secondary | ICD-10-CM | POA: Insufficient documentation

## 2014-11-18 DIAGNOSIS — Z8673 Personal history of transient ischemic attack (TIA), and cerebral infarction without residual deficits: Secondary | ICD-10-CM | POA: Diagnosis not present

## 2014-11-18 DIAGNOSIS — Z794 Long term (current) use of insulin: Secondary | ICD-10-CM | POA: Insufficient documentation

## 2014-11-18 DIAGNOSIS — E119 Type 2 diabetes mellitus without complications: Secondary | ICD-10-CM | POA: Diagnosis not present

## 2014-11-18 DIAGNOSIS — R569 Unspecified convulsions: Secondary | ICD-10-CM

## 2014-11-18 DIAGNOSIS — E780 Pure hypercholesterolemia: Secondary | ICD-10-CM | POA: Insufficient documentation

## 2014-11-18 DIAGNOSIS — E049 Nontoxic goiter, unspecified: Secondary | ICD-10-CM | POA: Insufficient documentation

## 2014-11-18 DIAGNOSIS — F419 Anxiety disorder, unspecified: Secondary | ICD-10-CM | POA: Diagnosis not present

## 2014-11-18 DIAGNOSIS — K5641 Fecal impaction: Secondary | ICD-10-CM

## 2014-11-18 DIAGNOSIS — G40409 Other generalized epilepsy and epileptic syndromes, not intractable, without status epilepticus: Secondary | ICD-10-CM

## 2014-11-18 DIAGNOSIS — K592 Neurogenic bowel, not elsewhere classified: Secondary | ICD-10-CM | POA: Insufficient documentation

## 2014-11-18 DIAGNOSIS — M1990 Unspecified osteoarthritis, unspecified site: Secondary | ICD-10-CM | POA: Insufficient documentation

## 2014-11-18 DIAGNOSIS — G710 Muscular dystrophy: Secondary | ICD-10-CM | POA: Diagnosis not present

## 2014-11-18 DIAGNOSIS — N319 Neuromuscular dysfunction of bladder, unspecified: Secondary | ICD-10-CM | POA: Diagnosis not present

## 2014-11-18 DIAGNOSIS — I252 Old myocardial infarction: Secondary | ICD-10-CM | POA: Insufficient documentation

## 2014-11-18 DIAGNOSIS — Z9079 Acquired absence of other genital organ(s): Secondary | ICD-10-CM | POA: Diagnosis not present

## 2014-11-18 DIAGNOSIS — Z9049 Acquired absence of other specified parts of digestive tract: Secondary | ICD-10-CM | POA: Diagnosis not present

## 2014-11-18 HISTORY — DX: Muscular dystrophy, unspecified: G71.00

## 2014-11-18 LAB — CBC
HCT: 37.8 % (ref 36.0–46.0)
Hemoglobin: 13.1 g/dL (ref 12.0–15.0)
MCH: 28.9 pg (ref 26.0–34.0)
MCHC: 34.7 g/dL (ref 30.0–36.0)
MCV: 83.4 fL (ref 78.0–100.0)
PLATELETS: 230 10*3/uL (ref 150–400)
RBC: 4.53 MIL/uL (ref 3.87–5.11)
RDW: 14.4 % (ref 11.5–15.5)
WBC: 5 10*3/uL (ref 4.0–10.5)

## 2014-11-18 LAB — POC OCCULT BLOOD, ED: Fecal Occult Bld: NEGATIVE

## 2014-11-18 LAB — BASIC METABOLIC PANEL
Anion gap: 7 (ref 5–15)
BUN: 17 mg/dL (ref 6–23)
CHLORIDE: 104 mmol/L (ref 96–112)
CO2: 28 mmol/L (ref 19–32)
Calcium: 9.4 mg/dL (ref 8.4–10.5)
Creatinine, Ser: 0.53 mg/dL (ref 0.50–1.10)
Glucose, Bld: 209 mg/dL — ABNORMAL HIGH (ref 70–99)
POTASSIUM: 3.5 mmol/L (ref 3.5–5.1)
Sodium: 139 mmol/L (ref 135–145)

## 2014-11-18 LAB — GLUCOSE, CAPILLARY: GLUCOSE-CAPILLARY: 187 mg/dL — AB (ref 70–99)

## 2014-11-18 LAB — HEPATIC FUNCTION PANEL
ALK PHOS: 54 U/L (ref 39–117)
ALT: 12 U/L (ref 0–35)
AST: 21 U/L (ref 0–37)
Albumin: 3.2 g/dL — ABNORMAL LOW (ref 3.5–5.2)
BILIRUBIN DIRECT: 0.2 mg/dL (ref 0.0–0.5)
BILIRUBIN INDIRECT: 0.5 mg/dL (ref 0.3–0.9)
Total Bilirubin: 0.7 mg/dL (ref 0.3–1.2)
Total Protein: 6.9 g/dL (ref 6.0–8.3)

## 2014-11-18 LAB — URINALYSIS, ROUTINE W REFLEX MICROSCOPIC
BILIRUBIN URINE: NEGATIVE
Glucose, UA: NEGATIVE mg/dL
HGB URINE DIPSTICK: NEGATIVE
KETONES UR: NEGATIVE mg/dL
LEUKOCYTES UA: NEGATIVE
Nitrite: NEGATIVE
PROTEIN: NEGATIVE mg/dL
Specific Gravity, Urine: 1.02 (ref 1.005–1.030)
Urobilinogen, UA: 0.2 mg/dL (ref 0.0–1.0)
pH: 6 (ref 5.0–8.0)

## 2014-11-18 LAB — POC URINE PREG, ED
PREG TEST UR: NEGATIVE
Preg Test, Ur: NEGATIVE

## 2014-11-18 LAB — I-STAT TROPONIN, ED: Troponin i, poc: 0.01 ng/mL (ref 0.00–0.08)

## 2014-11-18 LAB — CBG MONITORING, ED: Glucose-Capillary: 225 mg/dL — ABNORMAL HIGH (ref 70–99)

## 2014-11-18 MED ORDER — HEPARIN SODIUM (PORCINE) 5000 UNIT/ML IJ SOLN
5000.0000 [IU] | Freq: Three times a day (TID) | INTRAMUSCULAR | Status: DC
  Administered 2014-11-19 – 2014-11-20 (×4): 5000 [IU] via SUBCUTANEOUS
  Filled 2014-11-18 (×9): qty 1

## 2014-11-18 MED ORDER — SODIUM CHLORIDE 0.9 % IJ SOLN
3.0000 mL | Freq: Two times a day (BID) | INTRAMUSCULAR | Status: DC
  Administered 2014-11-18 – 2014-11-20 (×3): 3 mL via INTRAVENOUS

## 2014-11-18 MED ORDER — GADOBENATE DIMEGLUMINE 529 MG/ML IV SOLN
20.0000 mL | Freq: Once | INTRAVENOUS | Status: AC | PRN
  Administered 2014-11-18: 18 mL via INTRAVENOUS

## 2014-11-18 MED ORDER — INSULIN ASPART PROT & ASPART (70-30 MIX) 100 UNIT/ML ~~LOC~~ SUSP
10.0000 [IU] | Freq: Two times a day (BID) | SUBCUTANEOUS | Status: DC
  Administered 2014-11-19 – 2014-11-20 (×3): 10 [IU] via SUBCUTANEOUS
  Filled 2014-11-18: qty 10

## 2014-11-18 MED ORDER — SODIUM CHLORIDE 0.9 % IV SOLN
INTRAVENOUS | Status: DC
  Administered 2014-11-18 – 2014-11-20 (×3): via INTRAVENOUS

## 2014-11-18 MED ORDER — INSULIN ASPART 100 UNIT/ML ~~LOC~~ SOLN
0.0000 [IU] | SUBCUTANEOUS | Status: DC
  Administered 2014-11-18 – 2014-11-19 (×2): 2 [IU] via SUBCUTANEOUS
  Administered 2014-11-19: 3 [IU] via SUBCUTANEOUS
  Administered 2014-11-19 – 2014-11-20 (×4): 2 [IU] via SUBCUTANEOUS

## 2014-11-18 MED ORDER — IRBESARTAN 300 MG PO TABS
300.0000 mg | ORAL_TABLET | Freq: Every day | ORAL | Status: DC
  Administered 2014-11-19 – 2014-11-20 (×2): 300 mg via ORAL
  Filled 2014-11-18 (×2): qty 1

## 2014-11-18 MED ORDER — FLEET ENEMA 7-19 GM/118ML RE ENEM
1.0000 | ENEMA | Freq: Once | RECTAL | Status: AC
  Administered 2014-11-18: 1 via RECTAL
  Filled 2014-11-18: qty 1

## 2014-11-18 MED ORDER — AMLODIPINE BESYLATE 10 MG PO TABS
10.0000 mg | ORAL_TABLET | Freq: Every day | ORAL | Status: DC
  Administered 2014-11-19: 10 mg via ORAL
  Filled 2014-11-18 (×2): qty 1

## 2014-11-18 NOTE — Consult Note (Signed)
NEURO HOSPITALIST CONSULT NOTE    Reason for Consult: seizure versus syncope  HPI:                                                                                                                                          Hannah Lamb is an 74 y.o. female with a past medical history significant for HTN, DM, hyperlipidemia, MI, stroke, recurrent syncope, and MS, who was at her doctor's office today when she had an episode of sudden loss of consciousness followed by generalized body jerkiness that lasted for approximately one minute and afterwards she was " confused, did not know the president's name but was able to recognize family members when EMS arrived". She was incontinent of stools but no tongue biting.  Family is at the bedside and indicated that " she had had multiple episodes of losing consciousness before but never with jerkiness like this". Patient is now back to baseline, but was advised to come to the ED by her neurologist Dr .Anne Hahn in order to get MRI and EEG. I reviewed the detailed neurological consult note from Dr .Anne Hahn which indicated that patient has a long standing history of progressive MS diagnosed at [facility] and was never treated with disease modifying agents as she was felt to have a chronic progressive illness.As per Dr .Anne Hahn notes" The patient over time has lost her ability to ambulate, she has not walked in greater than 5 years. The patient last had an MRI evaluation done in 2013 showing extensive paraventricular white matter changes". " Over the last 4 or 5 years, she has had intermittent syncopal events. The episodes have been evaluated through cardiology, a 2-D echocardiogram was done in 2013 another one was ordered in October 2015, but never done. The patient has a heart murmur, but no significant valve abnormalities were seen on 2D echocardiogram. The patient indicates that she also had a prolonged cardiac monitor, and had episodes of syncope  during the monitoring period, and no abnormalities of cardiac rhythm were noted. The patient indicates that the episodes generally start with pain in the buttocks area, and she will have syncope only while sitting. The patient will then have some sensation of abdominal discomfort, nausea. She will then have syncope. If she is able lay down quickly, she can avert a syncopal event. She will be clammy during the event. The patient may have some visual dimming prior to the loss of consciousness, no tongue biting. The patient will have a sensation of needing to have a bowel movement following the episode. She recently had her gallbladder resected, and she has not had any syncopal events since that time. Otherwise, the events are essentially daily if she is sitting too long. She spends most of her time lying flat in  bed". At this moment, she denies HA, vertigo, double vision, difficulty swallowing, slurred speech, language or vision impairment.   Past Medical History  Diagnosis Date  . Hypertension   . MS (multiple sclerosis)   . Diabetes mellitus   . Goiter   . Hyperlipidemia   . Myocardial infarction   . Anxiety   . Stroke   . Syncope     recurrent  . Bilateral chronic knee pain   . Muscular dystrophy     Past Surgical History  Procedure Laterality Date  . Abdominal hysterectomy    . Cholecystectomy N/A 10/23/2014    Procedure: LAPAROSCOPIC CHOLECYSTECTOMY WITH INTRAOPERATIVE CHOLANGIOGRAM;  Surgeon: Harriette Bouillon, MD;  Location: [facility] OR;  Service: General;  Laterality: N/A;    Family History  Problem Relation Age of Onset  . Diabetes Father     Family History: no epilepsy, brain tumors, or brain aneurysms.   Social History:  reports that she has never smoked. She has never used smokeless tobacco. She reports that she does not drink alcohol or use illicit drugs.  Allergies  Allergen Reactions  . Sulfa Drugs Cross Reactors Other (See Comments)    dizziness  . Lipitor [Atorvastatin]  Other (See Comments)    Muscle weakness    MEDICATIONS:                                                                                                                     I have reviewed the patient's current medications.   ROS:                                                                                                                                       History obtained from the patient, family, and chart review.  General ROS: negative for - chills, fever, night sweats,or  weight gain  Psychological ROS: negative for - behavioral disorder, hallucinations, mood swings or suicidal ideation Ophthalmic ROS: negative for - blurry vision, double vision, eye pain or loss of vision ENT ROS: negative for - epistaxis, nasal discharge, oral lesions, sore throat, tinnitus or vertigo Allergy and Immunology ROS: negative for - hives or itchy/watery eyes Hematological and Lymphatic ROS: negative for - bleeding problems, bruising or swollen lymph nodes Endocrine ROS: negative for - galactorrhea, hair pattern changes, polydipsia/polyuria or temperature intolerance Respiratory ROS: negative for - cough, hemoptysis, shortness of breath or wheezing Cardiovascular ROS: negative for -  chest pain, dyspnea on exertion, edema or irregular heartbeat Gastrointestinal ROS: negative for - abdominal pain, diarrhea, hematemesis, nausea/vomiting Genito-Urinary ROS: negative for - dysuria, hematuria Musculoskeletal ROS: negative for - joint swelling Neurological ROS: as noted in HPI Dermatological ROS: negative for rash and skin lesion changes  Physical exam: pleasant female in no apparent distress. Blood pressure 190/87, pulse 86, temperature 98.2 F (36.8 C), temperature source Oral, resp. rate 18, height  (1.778 m), weight 83.462 kg (184 lb), SpO2 100 %. Head: normocephalic. Neck: supple, no bruits, no JVD. Cardiac: no murmurs. Lungs: clear. Abdomen: soft, no tender, no mass. Extremities: mild  bilateral LE edema. Neurologic Examination:                                                                                                      General: Mental Status: Alert, oriented, thought content appropriate.  Speech fluent without evidence of aphasia.  Able to follow 3 step commands without difficulty. Cranial Nerves: II: Discs flat bilaterally; Visual fields grossly normal, pupils equal, round, reactive to light and accommodation III,IV, VI: ptosis not present, extra-ocular motions intact bilaterally V,VII: smile symmetric, facial light touch sensation normal bilaterally VIII: hearing normal bilaterally IX,X: uvula rises symmetrically XI: bilateral shoulder shrug XII: midline tongue extension without atrophy or fasciculations Motor: Right : Upper extremity   5/5    Left:     Upper extremity   5/5  Lower extremity   3/5     Lower extremity   3/5 Tone and bulk:normal tone throughout; no atrophy noted Sensory: Pinprick and light touch intact throughout, bilaterally Deep Tendon Reflexes:  Depressed all over Plantars: Right: downgoing   Left: downgoing Cerebellar: normal finger-to-nose,  Unable to test heel-to-shin  Gait:  Patient is unable to walk    Lab Results  Component Value Date/Time   CHOL 194 02/17/2012 04:54 AM    Results for orders placed or performed during the hospital encounter of 11/18/14 (from the past 48 hour(s))  CBG, ED     Status: Abnormal   Collection Time: 11/18/14  3:28 PM  Result Value Ref Range   Glucose-Capillary 225 (H) 70 - 99 mg/dL  Urinalysis, Routine w reflex microscopic     Status: None   Collection Time: 11/18/14  3:56 PM  Result Value Ref Range   Color, Urine YELLOW YELLOW   APPearance CLEAR CLEAR   Specific Gravity, Urine 1.020 1.005 - 1.030   pH 6.0 5.0 - 8.0   Glucose, UA NEGATIVE NEGATIVE mg/dL   Hgb urine dipstick NEGATIVE NEGATIVE   Bilirubin Urine NEGATIVE NEGATIVE   Ketones, ur NEGATIVE NEGATIVE mg/dL   Protein, ur NEGATIVE  NEGATIVE mg/dL   Urobilinogen, UA 0.2 0.0 - 1.0 mg/dL   Nitrite NEGATIVE NEGATIVE   Leukocytes, UA NEGATIVE NEGATIVE    Comment: MICROSCOPIC NOT DONE ON URINES WITH NEGATIVE PROTEIN, BLOOD, LEUKOCYTES, NITRITE, OR GLUCOSE <1000 mg/dL.  CBC  (at [facility])     Status: None   Collection Time: 11/18/14  4:17 PM  Result Value Ref Range   WBC 5.0  4.0 - 10.5 K/uL   RBC 4.53 3.87 - 5.11 MIL/uL   Hemoglobin 13.1 12.0 - 15.0 g/dL   HCT 16.1 09.6 - 04.5 %   MCV 83.4 78.0 - 100.0 fL   MCH 28.9 26.0 - 34.0 pg   MCHC 34.7 30.0 - 36.0 g/dL   RDW 40.9 81.1 - 91.4 %   Platelets 230 150 - 400 K/uL  POC occult blood, ED Provider will collect     Status: None   Collection Time: 11/18/14  4:19 PM  Result Value Ref Range   Fecal Occult Bld NEGATIVE NEGATIVE  POC urine preg, ED (not at [facility])     Status: None   Collection Time: 11/18/14  4:30 PM  Result Value Ref Range   Preg Test, Ur NEGATIVE NEGATIVE    Comment:        THE SENSITIVITY OF THIS METHODOLOGY IS >24 mIU/mL   I-stat troponin, ED     Status: None   Collection Time: 11/18/14  4:41 PM  Result Value Ref Range   Troponin i, poc 0.01 0.00 - 0.08 ng/mL   Comment 3            Comment: Due to the release kinetics of cTnI, a negative result within the first hours of the onset of symptoms does not rule out myocardial infarction with certainty. If myocardial infarction is still suspected, repeat the test at appropriate intervals.     No results found.  Assessment/Plan: 74 y/o with multiple medical problems including long standing progressive MS, recurrent syncope, sent to the ED after sustaining a paroxysmal episode concerning for a generalized seizure. Patient is back to baseline at this time. Certainly MS patients are predispose to experience seizures, and she had had previous extensive but unrevealing cardiac work up for syncope. Will get MRI brain and EEG during this admission. Low threshold to start low dose keppra even if  inconclusive neuro testing. Will follow up.  Wyatt Portela, MD 11/18/2014, 5:07 PM  [facility]

## 2014-11-18 NOTE — ED Notes (Signed)
Pt brought by [facility] from PCP office where she had a syncopal shaking episode.

## 2014-11-18 NOTE — Telephone Encounter (Signed)
The patient had an episode while in the office with Dr. Mikeal Hawthorne today. The patient had onset of loss of consciousness, jerking of the extremities, and bowel incontinence. He was felt that this event represented a seizure episode. The patient will be going to the hospital, it is possible that an EEG may be done there as well as an MRI the brain. We will consider initiation of treatment with Keppra in the future.

## 2014-11-18 NOTE — ED Notes (Signed)
Report attempted 

## 2014-11-18 NOTE — ED Provider Notes (Signed)
CSN: [id number]     Arrival date & time 11/18/14  1513 History   First MD Initiated Contact with Patient 11/18/14 1518     Chief Complaint  Patient presents with  . Loss of Consciousness  . Seizures     (Consider location/radiation/quality/duration/timing/severity/associated sxs/prior Treatment) HPI The patient reports she was at her doctor's office when she had a episode of loss of consciousness which she has had some frequency in the past. She reports that she was unaware of shaking which has been reported by EMS. This has happened many times in the past she reports and at this point she didn't have a specific diagnosis. I had reviewed the EMR and discussed with the patient a documented description of a pain that she gets in her buttock sore lower back which frequently occurs prior to these episodes. The patient reports she does have difficulty passing stool. She has to have her bowel movement in the diaper. She uses prune juice to try to keep her bowels moving however is difficult for her to monitor. She is chronically very weak in the legs and cannot walk independently. There is been no change in that respect. She denies she is expressing headache or chest pain. She reports she feels back to normal now. She was unaware that there may have been any shaking or other associated symptoms with the episode. Past Medical History  Diagnosis Date  . Hypertension   . MS (multiple sclerosis)   . Diabetes mellitus   . Goiter   . Hyperlipidemia   . Myocardial infarction   . Anxiety   . Stroke   . Syncope     recurrent  . Bilateral chronic knee pain   . Muscular dystrophy    Past Surgical History  Procedure Laterality Date  . Abdominal hysterectomy    . Cholecystectomy N/A 10/23/2014    Procedure: LAPAROSCOPIC CHOLECYSTECTOMY WITH INTRAOPERATIVE CHOLANGIOGRAM;  Surgeon: Harriette Bouillon, MD;  Location: [facility] OR;  Service: General;  Laterality: N/A;   Family History  Problem Relation Age of Onset   . Diabetes Father    History  Substance Use Topics  . Smoking status: Never Smoker   . Smokeless tobacco: Never Used  . Alcohol Use: No   OB History    No data available     Review of Systems 10 Systems reviewed and are negative for acute change except as noted in the HPI.    Allergies  Sulfa drugs cross reactors and Lipitor  Home Medications   Prior to Admission medications   Medication Sig Start Date End Date Taking? Authorizing Provider  amLODipine (NORVASC) 10 MG tablet Take 1 tablet (10 mg total) by mouth daily. 01/08/13   Laveda Norman, MD  HUMULIN 70/30 (70-30) 100 UNIT/ML injection Inject 15 Units into the skin daily. 03/17/14   Historical Provider, MD  irbesartan (AVAPRO) 300 MG tablet Take 1 tablet (300 mg total) by mouth daily. 01/08/13   Laveda Norman, MD  potassium chloride (K-DUR) 10 MEQ tablet Take 10 mEq by mouth daily.    Historical Provider, MD  traMADol (ULTRAM) 50 MG tablet Take 1 tablet (50 mg total) by mouth every 6 (six) hours as needed. 10/25/14   Dorothea Ogle, MD   BP 186/81 mmHg  Pulse 71  Temp(Src) 98.2 F (36.8 C) (Oral)  Resp 25  Ht  (1.778 m)  Wt 184 lb (83.462 kg)  BMI 26.40 kg/m2  SpO2 100% Physical Exam  Constitutional: She is oriented to  person, place, and time.  The patient is alert and nontoxic with clear mental status. She has no respiratory distress. She is deconditioned and mildly obese.  HENT:  Head: Normocephalic and atraumatic.  Right Ear: External ear normal.  Left Ear: External ear normal.  Nose: Nose normal.  Mouth/Throat: Oropharynx is clear and moist.  Eyes: Conjunctivae and EOM are normal. Pupils are equal, round, and reactive to light. Right eye exhibits no discharge. Left eye exhibits no discharge.  Neck: Neck supple.  Cardiovascular: Normal rate, regular rhythm, normal heart sounds and intact distal pulses.   Pulmonary/Chest: Effort normal and breath sounds normal.  Abdominal: Soft. Bowel sounds are normal. She  exhibits no distension. There is no tenderness.  Genitourinary:  There is stool impacted in the rectal vault. This is hard and the anus is dilated.  Musculoskeletal:  The patient's lower extremities have mild edema. Predominantly the are deconditioned with decreased muscle mass. She however does not have any tenderness or asymmetric swelling. The upper extremity is or symmetric and the patient has appropriate use at command of the upper extremity.  Neurological: She is alert and oriented to person, place, and time. She has normal strength. No cranial nerve deficit. Coordination abnormal. GCS eye subscore is 4. GCS verbal subscore is 5. GCS motor subscore is 6.  The patient has limited to no ability to move her lower extremities about. This is a baseline for her. She is able to perform grips on both the upper extremities and reach across her body to roll herself in the stretcher. Her speech is clear and appropriate. Content and production are normal.  Skin: Skin is warm, dry and intact.  Psychiatric: She has a normal mood and affect.    ED Course  Procedures (including critical care time) Labs Review Labs Reviewed  BASIC METABOLIC PANEL - Abnormal; Notable for the following:    Glucose, Bld 209 (*)    All other components within normal limits  CBG MONITORING, ED - Abnormal; Notable for the following:    Glucose-Capillary 225 (*)    All other components within normal limits  CBC  URINALYSIS, ROUTINE W REFLEX MICROSCOPIC  POC OCCULT BLOOD, ED  I-STAT TROPOININ, ED  POC URINE PREG, ED  POC URINE PREG, ED    Imaging Review No results found.   EKG Interpretation None      Procedure: Digital disimpaction. A large amount of hard stool was disimpacted from the rectal vault. Consult Dr. Cyril Mourning. Will see in the ED. MDM   Final diagnoses:  Tonic clonic convulsion  Multiple sclerosis  Fecal impaction   The patient has a history of syncopal type episodes that has been undergoing  diagnostic workup per review of the electronic medical record. Review of the note from Dr. Anne Hahn suggests the patient was sent to the emergency department today with a witnessed tonic-clonic type of seizure activity. Suggestions were made in the note regarding pursuing MRI and EEG for further diagnostic evaluation of these episodes. Consult was placed to Dr. Cyril Mourning neurology for further assessment and determination of ongoing diagnostic workup. The patient has remained stable in the emergency department. On arrival she was alert with excellent cognitive function. Based on a history of the EMR for these episodes being precipitated by a type of abdominal or rectal pain which was corroborated by the patient, rectal examination did show fecal impaction. Disimpaction was undertaken as outlined.    Arby Barrette, MD 11/18/14 [phone]

## 2014-11-18 NOTE — ED Notes (Signed)
Attempted to give report 

## 2014-11-18 NOTE — H&P (Signed)
Hospitalist Admission History and Physical  Patient name: Hannah Lamb Medical record number: [id number] Date of birth: Jan 15, 1941 Age: 74 y.o. Gender: female  Primary Care Provider: Lonia Blood, MD  Chief Complaint: syncope   History of Present Illness:This is a 74 y.o. year old female with significant past medical history of MS, IDDM, LE weakness, neurogenic bladder/bowel presenting with syncope. Pt was at PCPs office today for follow up of cholecystectomy 2/2 cholecystitis where pt was noted to have witnessed syncopal episode. No head trauma. Pt unaware of how loing she was unconscious. Pt states that she has had similar episodes in the past there have been formally worked up by cardiology with ruling out of cardiogenic source. Normal echocardiogram within the past 3-5 years. Is being followed by Dr. Anne Hahn with neurology outpatient for MS. Patient reports prodrome of lower abdominal fullness and shortness of breath prior to onset. There was no witnessed generalized tonic-clonic movements. Patient denies any recent infections. No CP assd w/ sxs.  Presented to the ER temperature 98.2, heart rate 60s to 100s, respirations tens to 20s, blood pressure in the 150s to 190s over 80s to 100s. Satting 100% on room air. CBC and BMET WNL. Glu 209. Noted to be fecally impacted on exam. Disimpacted at bedside. Neuro consulted w/ recommendation for MRI and EEG.   Assessment and Plan: Hannah Lamb is a 74 y.o. year old female presenting with syncope    Active Problems:   Syncope   Seizure   1- Syncope -concern or neurogenic source in setting of MS -MRI and EEG per neuro recs -2D ECHO -tele bed -follow  2-MS -non ambulatory w/ secondary neurogenic bowel and bladder -MRI 2013 w/ extensive periventricular white matter disease -f/u neuro recs  3- IDDM -SSI  -A1C  4- HTN -elevated BP on presentation  -cont norvasc -titrate regimen as clinically indicated   FEN/GI: heart healthy carb  modified diet  Prophylaxis: sub q heparin  Disposition: pending further evaluation  Code Status:Full Code    Patient Active Problem List   Diagnosis Date Noted  . Seizure 11/18/2014  . Cholecystitis 10/20/2014  . Hypertension 10/20/2014  . Diabetes mellitus type 2, controlled 10/20/2014  . Hypokalemia 10/20/2014  . Acute cholecystitis 10/20/2014  . Syncope 07/04/2014  . Shoulder pain, acute/right 01/07/2013  . UTI (urinary tract infection) 01/07/2013  . Malignant hypertension 01/06/2013  . History of CVA (cerebrovascular accident) 02/21/2012  . Abnormal EKG 02/16/2012  . Weakness generalized 02/15/2012  . Hypertensive emergency 02/15/2012  . Multiple sclerosis 02/15/2012  . Diabetes mellitus type 2, uncontrolled 11/09/2006  . HYPERCHOLESTEROLEMIA 11/09/2006  . HYPERTENSION, BENIGN SYSTEMIC 11/09/2006  . ARTHRITIS 11/09/2006   Past Medical History: Past Medical History  Diagnosis Date  . Hypertension   . MS (multiple sclerosis)   . Diabetes mellitus   . Goiter   . Hyperlipidemia   . Myocardial infarction   . Anxiety   . Stroke   . Syncope     recurrent  . Bilateral chronic knee pain   . Muscular dystrophy     Past Surgical History: Past Surgical History  Procedure Laterality Date  . Abdominal hysterectomy    . Cholecystectomy N/A 10/23/2014    Procedure: LAPAROSCOPIC CHOLECYSTECTOMY WITH INTRAOPERATIVE CHOLANGIOGRAM;  Surgeon: Harriette Bouillon, MD;  Location: [facility] OR;  Service: General;  Laterality: N/A;    Social History: History   Social History  . Marital Status: Widowed    Spouse Name: N/A  . Number of Children: 7  . Years  of Education: 12th grade   Social History Main Topics  . Smoking status: Never Smoker   . Smokeless tobacco: Never Used  . Alcohol Use: No  . Drug Use: No  . Sexual Activity: No   Other Topics Concern  . None   Social History Narrative   Lives with daughter.   Patient is right handed.   Patient does not drink caffeine.     Family History: Family History  Problem Relation Age of Onset  . Diabetes Father     Allergies: Allergies  Allergen Reactions  . Sulfa Drugs Cross Reactors Other (See Comments)    dizziness  . Lipitor [Atorvastatin] Other (See Comments)    Muscle weakness    Current Facility-Administered Medications  Medication Dose Route Frequency Provider Last Rate Last Dose  . 0.9 %  sodium chloride infusion   Intravenous Continuous Floydene Flock, MD      . amLODipine (NORVASC) tablet 10 mg  10 mg Oral Daily Floydene Flock, MD      . heparin injection 5,000 Units  5,000 Units Subcutaneous 3 times per day Floydene Flock, MD      . insulin aspart (novoLOG) injection 0-9 Units  0-9 Units Subcutaneous 6 times per day Floydene Flock, MD      . Melene Muller ON 11/19/2014] insulin aspart protamine- aspart (NOVOLOG MIX 70/30) injection 10 Units  10 Units Subcutaneous BID WC Floydene Flock, MD      . irbesartan (AVAPRO) tablet 300 mg  300 mg Oral Daily Floydene Flock, MD      . sodium chloride 0.9 % injection 3 mL  3 mL Intravenous Q12H Floydene Flock, MD       Current Outpatient Prescriptions  Medication Sig Dispense Refill  . amLODipine (NORVASC) 10 MG tablet Take 1 tablet (10 mg total) by mouth daily. 30 tablet 0  . HUMULIN 70/30 (70-30) 100 UNIT/ML injection Inject 15 Units into the skin daily.    . irbesartan (AVAPRO) 300 MG tablet Take 1 tablet (300 mg total) by mouth daily. 30 tablet 0  . potassium chloride (K-DUR) 10 MEQ tablet Take 10 mEq by mouth daily.    . traMADol (ULTRAM) 50 MG tablet Take 1 tablet (50 mg total) by mouth every 6 (six) hours as needed. 65 tablet 0   Review Of Systems: 12 point ROS negative except as noted above in HPI.  Physical Exam: Filed Vitals:   11/18/14 1830  BP: 186/81  Pulse: 71  Temp:   Resp: 25    General: alert and cooperative HEENT: PERRLA, extra ocular movement intact and dry oral mucosa Heart: S1, S2 normal, no murmur, rub or gallop, regular  rate and rhythm Lungs: clear to auscultation, no wheezes or rales and unlabored breathing Abdomen: abdomen is soft without significant tenderness, masses, organomegaly or guarding Extremities: extremities normal, atraumatic, no cyanosis or edema Skin:no rashes Neurology: 1-2/5 strength in LEs, 2-3/5 strength in UEs, CN intact  Labs and Imaging: Lab Results  Component Value Date/Time   NA 139 11/18/2014 04:17 PM   K 3.5 11/18/2014 04:17 PM   CL 104 11/18/2014 04:17 PM   CO2 28 11/18/2014 04:17 PM   BUN 17 11/18/2014 04:17 PM   CREATININE 0.53 11/18/2014 04:17 PM   GLUCOSE 209* 11/18/2014 04:17 PM   Lab Results  Component Value Date   WBC 5.0 11/18/2014   HGB 13.1 11/18/2014   HCT 37.8 11/18/2014   MCV 83.4 11/18/2014   PLT 230  11/18/2014    No results found.         Doree Albee MD  Pager: [phone]

## 2014-11-19 ENCOUNTER — Observation Stay (HOSPITAL_COMMUNITY): Payer: Medicare Other

## 2014-11-19 DIAGNOSIS — R569 Unspecified convulsions: Secondary | ICD-10-CM | POA: Diagnosis not present

## 2014-11-19 DIAGNOSIS — R55 Syncope and collapse: Secondary | ICD-10-CM | POA: Diagnosis not present

## 2014-11-19 LAB — COMPREHENSIVE METABOLIC PANEL
ALT: 11 U/L (ref 0–35)
AST: 20 U/L (ref 0–37)
Albumin: 2.8 g/dL — ABNORMAL LOW (ref 3.5–5.2)
Alkaline Phosphatase: 50 U/L (ref 39–117)
Anion gap: 14 (ref 5–15)
BUN: 14 mg/dL (ref 6–23)
CO2: 19 mmol/L (ref 19–32)
Calcium: 9.2 mg/dL (ref 8.4–10.5)
Chloride: 107 mmol/L (ref 96–112)
Creatinine, Ser: 0.44 mg/dL — ABNORMAL LOW (ref 0.50–1.10)
Glucose, Bld: 208 mg/dL — ABNORMAL HIGH (ref 70–99)
Potassium: 3.6 mmol/L (ref 3.5–5.1)
SODIUM: 140 mmol/L (ref 135–145)
Total Bilirubin: 0.5 mg/dL (ref 0.3–1.2)
Total Protein: 6.3 g/dL (ref 6.0–8.3)

## 2014-11-19 LAB — GLUCOSE, CAPILLARY
GLUCOSE-CAPILLARY: 165 mg/dL — AB (ref 70–99)
Glucose-Capillary: 122 mg/dL — ABNORMAL HIGH (ref 70–99)
Glucose-Capillary: 163 mg/dL — ABNORMAL HIGH (ref 70–99)
Glucose-Capillary: 196 mg/dL — ABNORMAL HIGH (ref 70–99)
Glucose-Capillary: 204 mg/dL — ABNORMAL HIGH (ref 70–99)
Glucose-Capillary: 237 mg/dL — ABNORMAL HIGH (ref 70–99)

## 2014-11-19 LAB — CBC WITH DIFFERENTIAL/PLATELET
BASOS ABS: 0 10*3/uL (ref 0.0–0.1)
Basophils Relative: 0 % (ref 0–1)
Eosinophils Absolute: 0.1 10*3/uL (ref 0.0–0.7)
Eosinophils Relative: 3 % (ref 0–5)
HEMATOCRIT: 36.2 % (ref 36.0–46.0)
Hemoglobin: 12.2 g/dL (ref 12.0–15.0)
Lymphocytes Relative: 40 % (ref 12–46)
Lymphs Abs: 2.3 10*3/uL (ref 0.7–4.0)
MCH: 28.4 pg (ref 26.0–34.0)
MCHC: 33.7 g/dL (ref 30.0–36.0)
MCV: 84.4 fL (ref 78.0–100.0)
MONO ABS: 0.5 10*3/uL (ref 0.1–1.0)
Monocytes Relative: 9 % (ref 3–12)
Neutro Abs: 2.8 10*3/uL (ref 1.7–7.7)
Neutrophils Relative %: 48 % (ref 43–77)
Platelets: 223 10*3/uL (ref 150–400)
RBC: 4.29 MIL/uL (ref 3.87–5.11)
RDW: 14.7 % (ref 11.5–15.5)
WBC: 5.7 10*3/uL (ref 4.0–10.5)

## 2014-11-19 MED ORDER — HYDRALAZINE HCL 20 MG/ML IJ SOLN
5.0000 mg | INTRAMUSCULAR | Status: DC | PRN
  Filled 2014-11-19: qty 1

## 2014-11-19 MED ORDER — TRAMADOL HCL 50 MG PO TABS: 50.0000 mg | ORAL_TABLET | Freq: Four times a day (QID) | ORAL | Status: DC | PRN

## 2014-11-19 MED ORDER — DILTIAZEM HCL ER 60 MG PO CP12
60.0000 mg | ORAL_CAPSULE | Freq: Two times a day (BID) | ORAL | Status: DC
  Filled 2014-11-19 (×4): qty 1

## 2014-11-19 NOTE — Progress Notes (Signed)
UR completed 

## 2014-11-19 NOTE — Plan of Care (Signed)
Problem: Phase I Progression Outcomes Goal: OOB as tolerated unless otherwise ordered Outcome: Not Met (add Reason) Patient is not ambulatory (at baseline)

## 2014-11-19 NOTE — Progress Notes (Signed)
Notified Kirby NP of patient's blood pressures. Inquired about PRN. Will continue to monitor accordingly.

## 2014-11-19 NOTE — Progress Notes (Signed)
EEG Completed; Results Pending  

## 2014-11-19 NOTE — Progress Notes (Addendum)
[facility] PROGRESS NOTE  Hannah Lamb ZOX:[id number] DOB: August 20, 1941 DOA: 11/18/2014 PCP: Lonia Blood, MD  Assessment/Plan: 1- Syncope -concern or neurogenic source in setting of MS -MRI; Atrophy, extensive chronic white matter changes, negative for acute infarct.  -2D ECHO; with EF 70 % , There was dynamic obstruction at restin the mid cavity, with mid-cavity obliteration and Lamb mean gradient of 36 mm Hg.  -EEG; negative for epileptiform feature.  -Will follow neurology recommendation. Does patient need anti seizure medications.  -Discussed ECHO result with Dr Nicki Guadalajara, recommend patient to be hydrate, could try Cardizem to help with dynamic obstruction. I have discontinue Norvasc.  -Check Carotid doppler.   2-MS -non ambulatory w/ secondary neurogenic bowel and bladder -MRI 2013 w/ extensive periventricular white matter disease -f/u neuro recs  3- IDDM -SSI  -A1C pending.   4- HTN -elevated BP on presentation  -titrate regimen as clinically indicated  -started Cardizem.   Code Status: Full Code.  Family Communication:  Disposition Plan: To be determine   Consultants:  Neurology  Procedures:  ECHO; - Left ventricle: The cavity size was normal. There was severe focal basal and moderate concentric hypertrophy. Systolic function was vigorous. The estimated ejection fraction was 75%. There was dynamic obstruction at restin the mid cavity, with mid-cavity obliteration and Lamb mean gradient of 36 mm Hg. Wall motion was normal; there were no regional wall motion abnormalities. There was an increased relative contribution of atrial contraction to ventricular filling. Doppler parameters are consistent with abnormal left ventricular relaxation (grade 1 diastolic dysfunction).  Antibiotics:  none  HPI/Subjective: Feeling ok. No further episode. Daughter relates episodes happen when patient sit up in her wheelchair.  Objective: Filed  Vitals:   11/19/14 0839  BP: 163/72  Pulse: 74  Temp:   Resp: 20    Intake/Output Summary (Last 24 hours) at 11/19/14 1327 Last data filed at 11/19/14 0820  Gross per 24 hour  Intake    340 ml  Output      0 ml  Net    340 ml   Filed Weights   11/18/14 1517 11/18/14 2104 11/19/14 0507  Weight: 83.462 kg (184 lb) 72.077 kg (158 lb 14.4 oz) 72.077 kg (158 lb 14.4 oz)    Exam:   General:  Alert in no distress.   Cardiovascular: S 1, S 2 RRR  Respiratory: CTA  Abdomen: BS present, soft,. nt  Musculoskeletal: no edema  Data Reviewed: Basic Metabolic Panel:  Recent Labs Lab 11/18/14 1617 11/19/14 0527  NA 139 140  K 3.5 3.6  CL 104 107  CO2 28 19  GLUCOSE 209* 208*  BUN 17 14  CREATININE 0.53 0.44*  CALCIUM 9.4 9.2   Liver Function Tests:  Recent Labs Lab 11/18/14 2155 11/19/14 0527  AST 21 20  ALT 12 11  ALKPHOS 54 50  BILITOT 0.7 0.5  PROT 6.9 6.3  ALBUMIN 3.2* 2.8*   No results for input(s): LIPASE, AMYLASE in the last 168 hours. No results for input(s): AMMONIA in the last 168 hours. CBC:  Recent Labs Lab 11/18/14 1617 11/19/14 0527  WBC 5.0 5.7  NEUTROABS  --  2.8  HGB 13.1 12.2  HCT 37.8 36.2  MCV 83.4 84.4  PLT 230 223   Cardiac Enzymes: No results for input(s): CKTOTAL, CKMB, CKMBINDEX, TROPONINI in the last 168 hours. BNP (last 3 results) No results for input(s): BNP in the last 8760 hours.  ProBNP (last 3 results) No results for input(s): PROBNP in  the last 8760 hours.  CBG:  Recent Labs Lab 11/18/14 1528 11/18/14 2204 11/19/14 0011 11/19/14 0246 11/19/14 0501  GLUCAP 225* 187* 237* 204* 196*    No results found for this or any previous visit (from the past 240 hour(s)).   Studies: X-ray Chest Pa And Lateral  11/18/2014   CLINICAL DATA:  Syncope, history of diabetes and hypertension  EXAM: CHEST  2 VIEW  COMPARISON:  08/26/2014  FINDINGS: There is no focal parenchymal opacity, pleural effusion, or pneumothorax.  There is stable cardiomegaly.  The osseous structures are unremarkable.  IMPRESSION: No active cardiopulmonary disease.   Electronically Signed   By: Elige Ko   On: 11/18/2014 21:44   Mr Brain W Wo Contrast  11/18/2014   CLINICAL DATA:  Seizure. History of multiple sclerosis, diabetes, hypertension, hypercholesterolemia  EXAM: MRI HEAD WITHOUT AND WITH CONTRAST  TECHNIQUE: Multiplanar, multiecho pulse sequences of the brain and surrounding structures were obtained without and with intravenous contrast.  CONTRAST:  18mL MULTIHANCE GADOBENATE DIMEGLUMINE 529 MG/ML IV SOLN  COMPARISON:  MRI 02/16/2012.  CT 10/22/2014  FINDINGS: Negative for acute infarct.  Moderate atrophy.  Negative for hydrocephalus.  Extensive periventricular white matter hyperintensity is stable from the prior study and may be due to multiple sclerosis and or chronic microvascular ischemic change. Chronic infarct right inferior frontal lobe has contracted since the prior study when there was an acute infarct in this area. Chronic ischemic change in the basal ganglia bilaterally. Chronic lacunar infarction in the pons. Chronic small infarcts in the cerebellum bilaterally. Mega cisterna magna again noted.  Scattered areas of chronic micro hemorrhage in the basal ganglia bilaterally unchanged.  Negative for mass lesion.  No enhancing lesion following contrast administration.  IMPRESSION: Atrophy and extensive chronic white matter changes. Given the history multiple sclerosis, much this may be related to demyelinating disease however there also is an element of chronic microvascular ischemia specially in the basal ganglia and pons and cerebellum.  Negative for acute infarct or mass.  Chronic areas of micro hemorrhage the basal ganglia compatible with history of malignant hypertension.   Electronically Signed   By: Marlan Palau M.D.   On: 11/18/2014 20:21    Scheduled Meds: . amLODipine  10 mg Oral Daily  . heparin  5,000 Units Subcutaneous  3 times per day  . insulin aspart  0-9 Units Subcutaneous 6 times per day  . insulin aspart protamine- aspart  10 Units Subcutaneous BID WC  . irbesartan  300 mg Oral Daily  . sodium chloride  3 mL Intravenous Q12H   Continuous Infusions: . sodium chloride 75 mL/hr at 11/18/14 2238    Active Problems:   Syncope   Seizure    Time spent: 35 minutes     Hannah Lamb  [facility] Pager [phone]. If 7PM-7AM, please contact night-coverage at [email or web address], password [facility] 11/19/2014, 1:27 PM

## 2014-11-19 NOTE — Progress Notes (Signed)
  Echocardiogram 2D Echocardiogram has been performed.  Hannah Lamb A 11/19/2014, 10:00 AM

## 2014-11-19 NOTE — Progress Notes (Signed)
Dr. Sunnie Nielsen notified of pt's refusal to take new medication (caridizem). Pt and daughter would like explaination of why norvasc was d/c'd. Per MD, issue will be addressed tomorrow. Will report to night RN.

## 2014-11-19 NOTE — Procedures (Signed)
ELECTROENCEPHALOGRAM REPORT   Patient: Hannah Lamb       Room #: 1O10 EEG No. ID: [id number] Age: 74 y.o.        Sex: female Referring Physician: Regalado Report Date:  11/19/2014        Interpreting Physician: Thana Farr  History: Hannah Lamb is an 74 y.o. female presenting with syncope evaluated to rule out seizure.  Medications:  Scheduled: . amLODipine  10 mg Oral Daily  . heparin  5,000 Units Subcutaneous 3 times per day  . insulin aspart  0-9 Units Subcutaneous 6 times per day  . insulin aspart protamine- aspart  10 Units Subcutaneous BID WC  . irbesartan  300 mg Oral Daily  . sodium chloride  3 mL Intravenous Q12H    Conditions of Recording:  This is a 16 channel EEG carried out with the patient in the awake and drowsy states.  Description:  The waking background activity consists of a low voltage, symmetrical, fairly well organized, 10 Hz alpha activity, seen from the parieto-occipital and posterior temporal regions.  Low voltage fast activity, poorly organized, is seen anteriorly and is at times superimposed on more posterior regions.  A mixture of theta and alpha rhythms are seen from the central and temporal regions. The patient drowses with slowing to irregular, low voltage theta and beta activity.   Stage II sleep is not obtained. Hyperventilation was not performed.  Intermittent photic stimulation was performed but failed to illicit any change in the tracing.     IMPRESSION: Normal electroencephalogram, awake, drowsy and with activation procedures. There are no focal lateralizing or epileptiform features.   Thana Farr, MD [facility] [phone] 11/19/2014, 12:15 PM

## 2014-11-20 DIAGNOSIS — R55 Syncope and collapse: Secondary | ICD-10-CM

## 2014-11-20 DIAGNOSIS — R569 Unspecified convulsions: Secondary | ICD-10-CM | POA: Diagnosis not present

## 2014-11-20 LAB — CBC WITH DIFFERENTIAL/PLATELET
BASOS ABS: 0 10*3/uL (ref 0.0–0.1)
Basophils Relative: 0 % (ref 0–1)
Eosinophils Absolute: 0.3 10*3/uL (ref 0.0–0.7)
Eosinophils Relative: 5 % (ref 0–5)
HCT: 32.7 % — ABNORMAL LOW (ref 36.0–46.0)
Hemoglobin: 11 g/dL — ABNORMAL LOW (ref 12.0–15.0)
LYMPHS PCT: 52 % — AB (ref 12–46)
Lymphs Abs: 2.8 10*3/uL (ref 0.7–4.0)
MCH: 28.7 pg (ref 26.0–34.0)
MCHC: 33.6 g/dL (ref 30.0–36.0)
MCV: 85.4 fL (ref 78.0–100.0)
MONO ABS: 0.4 10*3/uL (ref 0.1–1.0)
MONOS PCT: 7 % (ref 3–12)
Neutro Abs: 1.9 10*3/uL (ref 1.7–7.7)
Neutrophils Relative %: 36 % — ABNORMAL LOW (ref 43–77)
PLATELETS: 199 10*3/uL (ref 150–400)
RBC: 3.83 MIL/uL — ABNORMAL LOW (ref 3.87–5.11)
RDW: 15 % (ref 11.5–15.5)
WBC: 5.4 10*3/uL (ref 4.0–10.5)

## 2014-11-20 LAB — COMPREHENSIVE METABOLIC PANEL
ALBUMIN: 2.7 g/dL — AB (ref 3.5–5.2)
ALT: 11 U/L (ref 0–35)
ANION GAP: 4 — AB (ref 5–15)
AST: 16 U/L (ref 0–37)
Alkaline Phosphatase: 48 U/L (ref 39–117)
BUN: 10 mg/dL (ref 6–23)
CALCIUM: 8.7 mg/dL (ref 8.4–10.5)
CO2: 31 mmol/L (ref 19–32)
Chloride: 106 mmol/L (ref 96–112)
Creatinine, Ser: 0.42 mg/dL — ABNORMAL LOW (ref 0.50–1.10)
GFR calc Af Amer: >90 mL/min (ref >90)
GFR calc non Af Amer: >90 mL/min (ref >90)
Glucose, Bld: 119 mg/dL — ABNORMAL HIGH (ref 70–99)
Potassium: 3.7 mmol/L (ref 3.5–5.1)
Sodium: 141 mmol/L (ref 135–145)
TOTAL PROTEIN: 6.1 g/dL (ref 6.0–8.3)
Total Bilirubin: 0.5 mg/dL (ref 0.3–1.2)

## 2014-11-20 LAB — GLUCOSE, CAPILLARY
GLUCOSE-CAPILLARY: 122 mg/dL — AB (ref 70–99)
GLUCOSE-CAPILLARY: 178 mg/dL — AB (ref 70–99)
Glucose-Capillary: 74 mg/dL (ref 70–99)
Glucose-Capillary: 93 mg/dL (ref 70–99)

## 2014-11-20 LAB — HEMOGLOBIN A1C
Hgb A1c MFr Bld: 7.1 % — ABNORMAL HIGH (ref 4.8–5.6)
Mean Plasma Glucose: 157 mg/dL

## 2014-11-20 MED ORDER — LEVETIRACETAM 250 MG PO TABS: 250.0000 mg | ORAL_TABLET | Freq: Two times a day (BID) | ORAL | Status: AC

## 2014-11-20 MED ORDER — DILTIAZEM HCL ER 60 MG PO CP12: 60.0000 mg | ORAL_CAPSULE | Freq: Every day | ORAL | Status: AC

## 2014-11-20 MED ORDER — LEVETIRACETAM 250 MG PO TABS
250.0000 mg | ORAL_TABLET | Freq: Two times a day (BID) | ORAL | Status: DC
  Administered 2014-11-20: 250 mg via ORAL
  Filled 2014-11-20 (×2): qty 1

## 2014-11-20 NOTE — Progress Notes (Addendum)
Discharge education completed by RN. Pt and daughter received a copy of discharge paperwork and confirm understanding of follow up appointments and discharge medications. Both deny any questions at this time. IV removed, site is within normal limits. Pt will discharge from the unit via [facility].

## 2014-11-20 NOTE — Discharge Summary (Signed)
[facility]  Physician Discharge Summary   Patient ID: Hannah Lamb MRN: [id number] DOB/AGE: 1941-04-02 74 y.o.  Admit date: 11/18/2014 Discharge date: 11/20/2014  PCP: Lonia Blood, MD  DISCHARGE DIAGNOSES:  Active Problems:   Syncope   Seizure   RECOMMENDATIONS FOR OUTPATIENT FOLLOW UP: 1. Patient is to follow-up with her PCP within the next week to 10 days 2. Patient asked to check her blood pressure periodically. And to call her PCP for further instructions if it remains elevated.  DISCHARGE CONDITION: fair  Diet recommendation:, low-sodium   Filed Weights   11/18/14 2104 11/19/14 0507 11/20/14 0357  Weight: 72.077 kg (158 lb 14.4 oz) 72.077 kg (158 lb 14.4 oz) 71.9 kg (158 lb 8.2 oz)    INITIAL HISTORY:  74 year old African-American female with a history of MS was brought in due to a syncopal episode which was thought to be a seizure type activity. Patient was admitted for further management.  Consultations:  Neurology  Procedures: Carotid Doppler: 1-39% ICA stenosis bilaterally.   2-D echocardiogram Study Conclusions - Left ventricle: The cavity size was normal. There was severe focal basal and moderate concentric hypertrophy. Systolicfunction was vigorous. The estimated ejection fraction was 75%.There was dynamic obstruction at restin the mid cavity, withmid-cavity obliteration and a mean gradient of 36 mm Hg. Wallmotion was normal; there were no regional wall motionabnormalities. There was an increased relative contribution ofatrial contraction to ventricular filling. Doppler parameters areconsistent with abnormal left ventricular relaxation (grade 1diastolic dysfunction).   HOSPITAL COURSE:   1- Syncope She did have some tonic-clonic activity when she had the episode of syncope. She does have a history of MS which does increase her risk of developing seizure disorder. Patient was seen by neurology. She underwent workup as discussed earlier. EEG  was negative for epileptiform activity. After further discussions, patient was started on Keppra. She did not have any further episodes in the hospital. She was considered stable for discharge.  MRI; Atrophy, extensive chronic white matter changes, negative for acute infarct. 2D ECHO; with EF 70 % , There was dynamic obstruction at restin the mid cavity, withmid-cavity obliteration and a mean gradient of 36 mm Hg. Dr. Sunnie Nielsen discussed ECHO result with Dr Nicki Guadalajara with cardiology. He recommended Cardizem instead of Norvasc. No other workup was recommended.  2-MS -non ambulatory w/ secondary neurogenic bowel and bladder -MRI 2013 w/ extensive periventricular white matter disease  3- IDDM  continue home medication regimen.  4- HTN Cardizem instead of amlodipine as discussed earlier. Patient was very reluctant to take Cardizem. She was reassured and she finally agreed to take a lower dose. Patient's daughter will check her blood pressure periodically and call her PCP for further instructions.   Patient was improved and considered stable for discharge.   PERTINENT LABS:  The results of significant diagnostics from this hospitalization (including imaging, microbiology, ancillary and laboratory) are listed below for reference.     Labs: Basic Metabolic Panel:  Recent Labs Lab 11/18/14 1617 11/19/14 0527 11/20/14 0603  NA 139 140 141  K 3.5 3.6 3.7  CL 104 107 106  CO2 28 19 31   GLUCOSE 209* 208* 119*  BUN 17 14 10   CREATININE 0.53 0.44* 0.42*  CALCIUM 9.4 9.2 8.7   Liver Function Tests:  Recent Labs Lab 11/18/14 2155 11/19/14 0527 11/20/14 0603  AST 21 20 16   ALT 12 11 11   ALKPHOS 54 50 48  BILITOT 0.7 0.5 0.5  PROT 6.9 6.3 6.1  ALBUMIN 3.2* 2.8*  2.7*   CBC:  Recent Labs Lab 11/18/14 1617 11/19/14 0527 11/20/14 0603  WBC 5.0 5.7 5.4  NEUTROABS  --  2.8 1.9  HGB 13.1 12.2 11.0*  HCT 37.8 36.2 32.7*  MCV 83.4 84.4 85.4  PLT 230 223 199    CBG:  Recent  Labs Lab 11/19/14 1612 11/19/14 2008 11/20/14 0009 11/20/14 0054 11/20/14 0400  GLUCAP 163* 165* 74 93 122*     IMAGING STUDIES X-ray Chest Pa And Lateral  11/18/2014   CLINICAL DATA:  Syncope, history of diabetes and hypertension  EXAM: CHEST  2 VIEW  COMPARISON:  08/26/2014  FINDINGS: There is no focal parenchymal opacity, pleural effusion, or pneumothorax. There is stable cardiomegaly.  The osseous structures are unremarkable.  IMPRESSION: No active cardiopulmonary disease.   Electronically Signed   By: Elige Ko   On: 11/18/2014 21:44   Dg Cholangiogram Operative  10/23/2014   CLINICAL DATA:  74 year old female with acute cholecystitis.  EXAM: INTRAOPERATIVE CHOLANGIOGRAM  TECHNIQUE: Cholangiographic images from the C-arm fluoroscopic device were submitted for interpretation post-operatively. Please see the procedural report for the amount of contrast and the fluoroscopy time utilized.  COMPARISON:  MRCP 10/22/2014  FINDINGS: Single cine loop and spot image obtained at the time of intraoperative cholangiogram during laparoscopic cholecystectomy. The images demonstrate cannulation of the cystic duct remnant with opacification of the intra and extrahepatic biliary tree. No evidence of biliary ductal dilatation, stenosis, stricture or filling defect. Contrast material passes freely through the ampulla and into the duodenum.  IMPRESSION: Negative intraoperative cholangiogram.   Electronically Signed   By: Malachy Moan M.D.   On: 10/23/2014 16:18   Ct Head Wo Contrast  10/22/2014   CLINICAL DATA:  Acute CVA, slurred speech.  Elevated blood pressure.  EXAM: CT HEAD WITHOUT CONTRAST  TECHNIQUE: Contiguous axial images were obtained from the base of the skull through the vertex without intravenous contrast.  COMPARISON:  None.  FINDINGS: Chronic microvascular disease throughout the deep white matter. Old lacunar infarcts in the left thalamus and right basal ganglia, stable. No acute infarction,  hemorrhage or hydrocephalus. Old lacunar infarct in the right brainstem. No mass lesion or mass-effect. No extra-axial fluid collection. No acute calvarial abnormality.  Visualized paranasal sinuses and mastoids clear. Orbital soft tissues unremarkable.  IMPRESSION: Chronic microvascular disease. 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 No acute intracranial abnormality.   Electronically Signed   By: Charlett Nose M.D.   On: 10/22/2014 08:21   Mr Laqueta Jean ZO Contrast  11/18/2014   CLINICAL DATA:  Seizure. History of multiple sclerosis, diabetes, hypertension, hypercholesterolemia  EXAM: MRI HEAD WITHOUT AND WITH CONTRAST  TECHNIQUE: Multiplanar, multiecho pulse sequences of the brain and surrounding structures were obtained without and with intravenous contrast.  CONTRAST:  18mL MULTIHANCE GADOBENATE DIMEGLUMINE 529 MG/ML IV SOLN  COMPARISON:  MRI 02/16/2012.  CT 10/22/2014  FINDINGS: Negative for acute infarct.  Moderate atrophy.  Negative for hydrocephalus.  Extensive periventricular white matter hyperintensity is stable from the prior study and may be due to multiple sclerosis and or chronic microvascular ischemic change. Chronic infarct right inferior frontal lobe has contracted since the prior study when there was an acute infarct in this area. Chronic ischemic change in the basal ganglia bilaterally. Chronic lacunar infarction in the pons. Chronic small infarcts in the cerebellum bilaterally. Mega cisterna magna again noted.  Scattered areas of chronic micro hemorrhage in the basal ganglia bilaterally unchanged.  Negative for mass lesion.  No enhancing lesion following contrast administration.  IMPRESSION: Atrophy and extensive chronic white matter changes. Given the history multiple sclerosis, much this may be related to demyelinating disease however there also is an element of chronic microvascular ischemia specially in the basal ganglia and pons and cerebellum.  Negative for acute infarct or mass.  Chronic areas of  micro hemorrhage the basal ganglia compatible with history of malignant hypertension.   Electronically Signed   By: Marlan Palau M.D.   On: 11/18/2014 20:21   Mr 3d Recon At Scanner  10/22/2014   CLINICAL DATA:  Subsequent encounter for 2 day history of nausea and vomiting with right upper quadrant abdominal pain. Abnormal LFTs.  EXAM: MRI ABDOMEN WITHOUT AND WITH CONTRAST (INCLUDING MRCP)  TECHNIQUE: Multiplanar multisequence MR imaging of the abdomen was performed both before and after the administration of intravenous contrast. Heavily T2-weighted images of the biliary and pancreatic ducts were obtained, and three-dimensional MRCP images were rendered by post processing.  CONTRAST:  16mL MULTIHANCE GADOBENATE DIMEGLUMINE 529 MG/ML IV SOLN  COMPARISON:  CT from 10/20/2014.  FINDINGS: Lower chest:  Unremarkable.  Hepatobiliary: . No focal abnormality is seen in the liver parenchyma. There is no intrahepatic biliary duct dilatation. No dilatation of the extrahepatic bile ducts. Layering sludge is seen in the lumen of the gallbladder in multiple tiny stones layer dependently within the sludge. Pericholecystic fluid is associated. No evidence for choledocholithiasis.  Pancreas: No focal mass lesion. No dilatation of the main duct. 2 mm cystic focus is identified in the body of the pancreas (see image 18 series 600 and image 29 series 1401).  Spleen: No splenomegaly. No focal mass lesion.  Adrenals/Urinary Tract: No adrenal nodule or mass. Tiny cortical cysts are noted in the left kidney.  Stomach/Bowel: Stomach is nondistended. No gastric wall thickening. No evidence of outlet obstruction. Duodenum is normally positioned as is the ligament of Treitz. No small bowel dilatation within the visualized abdomen.  Vascular/Lymphatic: No abdominal aortic aneurysm no evidence for abdominal lymphadenopathy.  Other: As mentioned above, there is a tiny amount of free fluid around the gallbladder.  Musculoskeletal: No  abnormal signal within the visualized bony anatomy.  IMPRESSION: 1. Layering gallbladder sludge with tiny stones seen dependently within the gallbladder lumen. No evidence for choledocholithiasis. There is a small amount of fluid around the gallbladder as seen on the recent CT scan. 2. Tiny 2 mm cystic focus seen in the pancreatic parenchyma, most likely benign. Followup MRI in 1 year is recommended to re-evaluate. This recommendation follows ACR consensus guidelines: Managing Incidental Findings on Abdominal CT: White Paper of the ACR Incidental Findings Committee. Earlyne Iba Radiol [phone]   Electronically Signed   By: Kennith Center M.D.   On: 10/22/2014 14:52   Mr Roe Coombs W/wo Cm/mrcp  10/22/2014   CLINICAL DATA:  Subsequent encounter for 2 day history of nausea and vomiting with right upper quadrant abdominal pain. Abnormal LFTs.  EXAM: MRI ABDOMEN WITHOUT AND WITH CONTRAST (INCLUDING MRCP)  TECHNIQUE: Multiplanar multisequence MR imaging of the abdomen was performed both before and after the administration of intravenous contrast. Heavily T2-weighted images of the biliary and pancreatic ducts were obtained, and three-dimensional MRCP images were rendered by post processing.  CONTRAST:  16mL MULTIHANCE GADOBENATE DIMEGLUMINE 529 MG/ML IV SOLN  COMPARISON:  CT from 10/20/2014.  FINDINGS: Lower chest:  Unremarkable.  Hepatobiliary: . No focal abnormality is seen in the liver parenchyma. There is no intrahepatic biliary duct dilatation. No dilatation of the extrahepatic bile ducts. Layering sludge is seen  in the lumen of the gallbladder in multiple tiny stones layer dependently within the sludge. Pericholecystic fluid is associated. No evidence for choledocholithiasis.  Pancreas: No focal mass lesion. No dilatation of the main duct. 2 mm cystic focus is identified in the body of the pancreas (see image 18 series 600 and image 29 series 1401).  Spleen: No splenomegaly. No focal mass lesion.  Adrenals/Urinary  Tract: No adrenal nodule or mass. Tiny cortical cysts are noted in the left kidney.  Stomach/Bowel: Stomach is nondistended. No gastric wall thickening. No evidence of outlet obstruction. Duodenum is normally positioned as is the ligament of Treitz. No small bowel dilatation within the visualized abdomen.  Vascular/Lymphatic: No abdominal aortic aneurysm no evidence for abdominal lymphadenopathy.  Other: As mentioned above, there is a tiny amount of free fluid around the gallbladder.  Musculoskeletal: No abnormal signal within the visualized bony anatomy.  IMPRESSION: 1. Layering gallbladder sludge with tiny stones seen dependently within the gallbladder lumen. No evidence for choledocholithiasis. There is a small amount of fluid around the gallbladder as seen on the recent CT scan. 2. Tiny 2 mm cystic focus seen in the pancreatic parenchyma, most likely benign. Followup MRI in 1 year is recommended to re-evaluate. This recommendation follows ACR consensus guidelines: Managing Incidental Findings on Abdominal CT: White Paper of the ACR Incidental Findings Committee. Earlyne Iba Radiol [phone]   Electronically Signed   By: Kennith Center M.D.   On: 10/22/2014 14:52    DISCHARGE EXAMINATION: Filed Vitals:   11/19/14 0839 11/19/14 2002 11/20/14 0011 11/20/14 0357  BP: 163/72 151/82 164/86 160/68  Pulse: 74 76 80 72  Temp:  98.7 F (37.1 C) 98.9 F (37.2 C) 98.2 F (36.8 C)  TempSrc:  Oral Oral Oral  Resp: 20 19 20 21   Height:      Weight:    71.9 kg (158 lb 8.2 oz)  SpO2: 100% 99% 100% 100%   General appearance: alert, cooperative, appears stated age and no distress Resp: clear to auscultation bilaterally Cardio: regular rate and rhythm, S1, S2 normal, no murmur, click, rub or gallop GI: soft, non-tender; bowel sounds normal; no masses,  no organomegaly  DISPOSITION: Home with daughter  Discharge Instructions    Call MD for:  extreme fatigue    Complete by:  As directed      Call MD for:   persistant dizziness or light-headedness    Complete by:  As directed      Call MD for:  severe uncontrolled pain    Complete by:  As directed      Call MD for:  temperature >100.4    Complete by:  As directed      Diet - low sodium heart healthy    Complete by:  As directed      Discharge instructions    Complete by:  As directed   Check Bp at home. Call PCP if BP remains higher than 150 consistently. You may have to increase the dose of Cardizem.     Increase activity slowly    Complete by:  As directed            ALLERGIES:  Allergies  Allergen Reactions  . Sulfa Drugs Cross Reactors Other (See Comments)    dizziness  . Lipitor [Atorvastatin] Other (See Comments)    Muscle weakness     Discharge Medication List as of 11/20/2014  2:05 PM    START taking these medications   Details  diltiazem (CARDIZEM  SR) 60 MG 12 hr capsule Take 1 capsule (60 mg total) by mouth daily., Starting 11/20/2014, Until Discontinued, Print    levETIRAcetam (KEPPRA) 250 MG tablet Take 1 tablet (250 mg total) by mouth 2 (two) times daily., Starting 11/20/2014, Until Discontinued, Print      CONTINUE these medications which have NOT CHANGED   Details  HUMULIN 70/30 (70-30) 100 UNIT/ML injection Inject 15 Units into the skin daily., Starting 03/17/2014, Until Discontinued, Historical Med    potassium chloride (K-DUR) 10 MEQ tablet Take 10 mEq by mouth daily., Until Discontinued, Historical Med    traMADol (ULTRAM) 50 MG tablet Take 1 tablet (50 mg total) by mouth every 6 (six) hours as needed., Starting 10/25/2014, Until Discontinued, Print    B-D INS SYRINGE 0.5CC/30GX1/2" 30G X 1/2" 0.5 ML MISC Starting 10/28/2014, Until Discontinued, Historical Med    irbesartan (AVAPRO) 300 MG tablet Take 1 tablet (300 mg total) by mouth daily., Starting 01/08/2013, Until Discontinued, Print      STOP taking these medications     amLODipine (NORVASC) 10 MG tablet        Follow-up Information    Follow up  with GARBA,LAWAL, MD. Schedule an appointment as soon as possible for a visit in 1 week.   Specialty:  Internal Medicine   Why:  post hospitalization follow up   Contact information:   [address]  [place] [address] [phone]       TOTAL DISCHARGE TIME: 35 mins  [facility]  [facility] Pager [phone]  11/20/2014, 6:22 PM

## 2014-11-20 NOTE — Progress Notes (Signed)
VASCULAR LAB PRELIMINARY  PRELIMINARY  PRELIMINARY  PRELIMINARY  Carotid Dopplers completed.    Preliminary report:  1-39% ICA stenosis.  Vertebral artery flow is antegrade.   Hannah Lamb, RVT 11/20/2014, 11:40 AM

## 2014-11-20 NOTE — Discharge Instructions (Signed)

## 2014-11-20 NOTE — Progress Notes (Signed)
NEURO HOSPITALIST PROGRESS NOTE   SUBJECTIVE:                                                                                                                        Eating breakfast. No new neurological developments. MRI was personally reviewed and did not show acute abnormality.  EEG normal.  OBJECTIVE:                                                                                                                           Vital signs in last 24 hours: Temp:  [98.2 F (36.8 C)-98.9 F (37.2 C)] 98.2 F (36.8 C) (03/10 0357) Pulse Rate:  [72-80] 72 (03/10 0357) Resp:  [19-21] 21 (03/10 0357) BP: (151-164)/(68-86) 160/68 mmHg (03/10 0357) SpO2:  [99 %-100 %] 100 % (03/10 0357) Weight:  [71.9 kg (158 lb 8.2 oz)] 71.9 kg (158 lb 8.2 oz) (03/10 0357)  Intake/Output from previous day: 03/09 0701 - 03/10 0700 In: 1545 [P.O.:720; I.V.:825] Out: -  Intake/Output this shift:   Nutritional status: Diet heart healthy/carb modified  Past Medical History  Diagnosis Date  . Hypertension   . MS (multiple sclerosis)   . Diabetes mellitus   . Goiter   . Hyperlipidemia   . Myocardial infarction   . Anxiety   . Stroke   . Syncope     recurrent  . Bilateral chronic knee pain   . Muscular dystrophy   Physical exam: pleasant female in no apparent distress. Head: normocephalic. Neck: supple, no bruits, no JVD. Cardiac: no murmurs. Lungs: clear. Abdomen: soft, no tender, no mass. Extremities: mild bilateral LE edema. Skin: no rash   Neurologic Exam:  General: Mental Status: Alert, oriented, thought content appropriate. Speech fluent without evidence of aphasia. Able to follow 3 step commands without difficulty. Cranial Nerves: II: Discs flat bilaterally; Visual fields grossly normal, pupils equal, round, reactive to light and accommodation III,IV, VI: ptosis not present, extra-ocular motions intact bilaterally V,VII: smile symmetric, facial  light touch sensation normal bilaterally VIII: hearing normal bilaterally IX,X: uvula rises symmetrically XI: bilateral shoulder shrug XII: midline tongue extension without atrophy or fasciculations Motor: Right :Upper extremity 5/5Left: Upper extremity 5/5 Lower extremity 3/5Lower extremity 3/5 Tone and bulk:normal tone throughout;  no atrophy noted Sensory: Pinprick and light touch intact throughout, bilaterally Deep Tendon Reflexes:  Depressed all over Plantars: Right: downgoingLeft: downgoing Cerebellar: normal finger-to-nose, Unable to test heel-to-shin  Gait:  Patient is unable to walk  Lab Results: Lab Results  Component Value Date/Time   CHOL 194 02/17/2012 04:54 AM   Lipid Panel No results for input(s): CHOL, TRIG, HDL, CHOLHDL, VLDL, LDLCALC in the last 72 hours.  Studies/Results: X-ray Chest Pa And Lateral  11/18/2014   CLINICAL DATA:  Syncope, history of diabetes and hypertension  EXAM: CHEST  2 VIEW  COMPARISON:  08/26/2014  FINDINGS: There is no focal parenchymal opacity, pleural effusion, or pneumothorax. There is stable cardiomegaly.  The osseous structures are unremarkable.  IMPRESSION: No active cardiopulmonary disease.   Electronically Signed   By: Elige Ko   On: 11/18/2014 21:44   Mr Brain W Wo Contrast  11/18/2014   CLINICAL DATA:  Seizure. History of multiple sclerosis, diabetes, hypertension, hypercholesterolemia  EXAM: MRI HEAD WITHOUT AND WITH CONTRAST  TECHNIQUE: Multiplanar, multiecho pulse sequences of the brain and surrounding structures were obtained without and with intravenous contrast.  CONTRAST:  18mL MULTIHANCE GADOBENATE DIMEGLUMINE 529 MG/ML IV SOLN  COMPARISON:  MRI 02/16/2012.  CT 10/22/2014  FINDINGS: Negative for acute infarct.  Moderate atrophy.  Negative for hydrocephalus.  Extensive  periventricular white matter hyperintensity is stable from the prior study and may be due to multiple sclerosis and or chronic microvascular ischemic change. Chronic infarct right inferior frontal lobe has contracted since the prior study when there was an acute infarct in this area. Chronic ischemic change in the basal ganglia bilaterally. Chronic lacunar infarction in the pons. Chronic small infarcts in the cerebellum bilaterally. Mega cisterna magna again noted.  Scattered areas of chronic micro hemorrhage in the basal ganglia bilaterally unchanged.  Negative for mass lesion.  No enhancing lesion following contrast administration.  IMPRESSION: Atrophy and extensive chronic white matter changes. Given the history multiple sclerosis, much this may be related to demyelinating disease however there also is an element of chronic microvascular ischemia specially in the basal ganglia and pons and cerebellum.  Negative for acute infarct or mass.  Chronic areas of micro hemorrhage the basal ganglia compatible with history of malignant hypertension.   Electronically Signed   By: Marlan Palau M.D.   On: 11/18/2014 20:21    MEDICATIONS                                                                                                                        Scheduled: . diltiazem  60 mg Oral Q12H  . heparin  5,000 Units Subcutaneous 3 times per day  . insulin aspart  0-9 Units Subcutaneous 6 times per day  . insulin aspart protamine- aspart  10 Units Subcutaneous BID WC  . irbesartan  300 mg Oral Daily  . sodium chloride  3 mL Intravenous Q12H    ASSESSMENT/PLAN:  74 y/o with multiple medical problems including long standing progressive MS, recurrent syncope, sent to the ED after sustaining a paroxysmal episode concerning for a generalized seizure. Patient is back to baseline at this time. MRI brain without  acute abnormality, EEG normal. Although seizure work up negative so far, seizures do occur in MS patients and this event had a semiology really concerning for a seizure. Thus, I concur with patient's neurologist Dr. Anne Hahn in terms of treating patient with low dose keppra in the meantime. Needs outpatient neurology follow up. Neurology will sign off. Wyatt Portela, MD [facility] [phone]  11/20/2014, 8:24 AM

## 2014-11-20 NOTE — Progress Notes (Signed)
UR completed 

## 2014-11-20 NOTE — Care Management Note (Signed)
    Page 1 of 1   11/20/2014     12:49:46 PM CARE MANAGEMENT NOTE 11/20/2014  Patient:  Hannah Lamb, Hannah Lamb   Account Number:  [id number]  Date Initiated:  11/20/2014  Documentation initiated by:  Avie Arenas  Subjective/Objective Assessment:   Admitted with seizures.     Action/Plan:   Anticipated DC Date:  11/20/2014   Anticipated DC Plan:  HOME W HOME HEALTH SERVICES      DC Planning Services  CM consult      Choice offered to / List presented to:             Status of service:  In process, will continue to follow Medicare Important Message given?   (If response is "NO", the following Medicare IM given date fields will be blank) Date Medicare IM given:   Medicare IM given by:   Date Additional Medicare IM given:   Additional Medicare IM given by:    Discharge Disposition:    Per UR Regulation:    If discussed at Long Length of Stay Meetings, dates discussed:    Comments:  11-20-14 10:45am  Avie Arenas, RNBSN [phone] Lives at home with daughter who is caregiver.  Patient is bedbound.  Have everything they need at home except a bedside table.  Contacted our [facility] DME laison.  This is not covered by insurance and is 150.00 out of pocket and can go to store on [address].  Updated daughter.  Daughter would also like [facility] nurse initially on discharge.  Has used HH in past with [facility].  I suggested a SW for community support including hooking up with [place] through county.  CM will continue to follow.

## 2014-11-26 ENCOUNTER — Telehealth: Payer: Self-pay | Admitting: Radiology

## 2014-11-26 ENCOUNTER — Other Ambulatory Visit: Payer: Medicare Other | Admitting: Radiology

## 2014-11-26 NOTE — Telephone Encounter (Signed)
Left message for patient.  Do not come for EEG scheduled for today at 1:00 per Dr. Anne Hahn.  It was completed in hospital.

## 2014-11-27 ENCOUNTER — Telehealth: Payer: Self-pay | Admitting: Radiology

## 2014-11-27 NOTE — Telephone Encounter (Signed)
Called patient for scheduling Carotid on 11/13/14 (left message) and 11/27/14 and left message to call.

## 2014-12-23 ENCOUNTER — Telehealth: Payer: Self-pay | Admitting: Radiology

## 2014-12-23 NOTE — Telephone Encounter (Signed)
12/23/14 Left 3rd v/message to return my call for scheduling Carotid doppler.

## 2015-01-01 ENCOUNTER — Telehealth: Payer: Self-pay | Admitting: Radiology

## 2015-01-01 NOTE — Telephone Encounter (Signed)
Called patient on 11/13/14, 11/27/14, 12/23/14, and 01/01/15.  I have left message each time requesting patient to call for scheduling.

## 2015-01-08 ENCOUNTER — Telehealth: Payer: Self-pay | Admitting: Radiology

## 2015-01-08 NOTE — Telephone Encounter (Signed)
Left v/ message to return my call for scheduling Carotid doppler.  Have been trying to schedule since first of March.

## 2015-01-21 ENCOUNTER — Telehealth: Payer: Self-pay | Admitting: Radiology

## 2015-01-21 NOTE — Telephone Encounter (Signed)
Left messages on 3/3, 3/17, 4/12,4/21, 01/08/15.  Patient has not responded for scheduling Carotid.

## 2015-04-04 ENCOUNTER — Encounter (HOSPITAL_COMMUNITY): Payer: Self-pay | Admitting: *Deleted

## 2015-04-04 ENCOUNTER — Emergency Department (HOSPITAL_COMMUNITY)
Admission: EM | Admit: 2015-04-04 | Discharge: 2015-04-05 | Disposition: A | Discharge status: Home / Self Care | Payer: Medicare Other | Attending: Emergency Medicine | Admitting: Emergency Medicine

## 2015-04-04 DIAGNOSIS — R55 Syncope and collapse: Secondary | ICD-10-CM | POA: Insufficient documentation

## 2015-04-04 DIAGNOSIS — F419 Anxiety disorder, unspecified: Secondary | ICD-10-CM | POA: Diagnosis not present

## 2015-04-04 DIAGNOSIS — Z79899 Other long term (current) drug therapy: Secondary | ICD-10-CM | POA: Insufficient documentation

## 2015-04-04 DIAGNOSIS — I10 Essential (primary) hypertension: Secondary | ICD-10-CM | POA: Insufficient documentation

## 2015-04-04 DIAGNOSIS — G8929 Other chronic pain: Secondary | ICD-10-CM | POA: Insufficient documentation

## 2015-04-04 DIAGNOSIS — E119 Type 2 diabetes mellitus without complications: Secondary | ICD-10-CM | POA: Diagnosis not present

## 2015-04-04 DIAGNOSIS — I252 Old myocardial infarction: Secondary | ICD-10-CM | POA: Diagnosis not present

## 2015-04-04 DIAGNOSIS — R569 Unspecified convulsions: Secondary | ICD-10-CM | POA: Diagnosis present

## 2015-04-04 DIAGNOSIS — Z8673 Personal history of transient ischemic attack (TIA), and cerebral infarction without residual deficits: Secondary | ICD-10-CM | POA: Insufficient documentation

## 2015-04-04 DIAGNOSIS — N390 Urinary tract infection, site not specified: Secondary | ICD-10-CM | POA: Diagnosis not present

## 2015-04-04 LAB — CBC WITH DIFFERENTIAL/PLATELET
BASOS ABS: 0 10*3/uL (ref 0.0–0.1)
Basophils Relative: 0 % (ref 0–1)
EOS ABS: 0.1 10*3/uL (ref 0.0–0.7)
EOS PCT: 1 % (ref 0–5)
HEMATOCRIT: 39.2 % (ref 36.0–46.0)
HEMOGLOBIN: 13 g/dL (ref 12.0–15.0)
Lymphocytes Relative: 20 % (ref 12–46)
Lymphs Abs: 1.8 10*3/uL (ref 0.7–4.0)
MCH: 28 pg (ref 26.0–34.0)
MCHC: 33.2 g/dL (ref 30.0–36.0)
MCV: 84.5 fL (ref 78.0–100.0)
MONO ABS: 0.5 10*3/uL (ref 0.1–1.0)
MONOS PCT: 5 % (ref 3–12)
NEUTROS ABS: 6.6 10*3/uL (ref 1.7–7.7)
Neutrophils Relative %: 74 % (ref 43–77)
Platelets: 210 10*3/uL (ref 150–400)
RBC: 4.64 MIL/uL (ref 3.87–5.11)
RDW: 14.4 % (ref 11.5–15.5)
WBC: 8.9 10*3/uL (ref 4.0–10.5)

## 2015-04-04 LAB — URINALYSIS, ROUTINE W REFLEX MICROSCOPIC
Bilirubin Urine: NEGATIVE
Glucose, UA: NEGATIVE mg/dL
Ketones, ur: NEGATIVE mg/dL
NITRITE: NEGATIVE
Specific Gravity, Urine: 1.015 (ref 1.005–1.030)
UROBILINOGEN UA: 1 mg/dL (ref 0.0–1.0)
pH: 6 (ref 5.0–8.0)

## 2015-04-04 LAB — COMPREHENSIVE METABOLIC PANEL
ALBUMIN: 3.5 g/dL (ref 3.5–5.0)
ALT: 12 U/L — AB (ref 14–54)
AST: 19 U/L (ref 15–41)
Alkaline Phosphatase: 60 U/L (ref 38–126)
Anion gap: 10 (ref 5–15)
BILIRUBIN TOTAL: 0.5 mg/dL (ref 0.3–1.2)
BUN: 21 mg/dL — AB (ref 6–20)
CALCIUM: 9.1 mg/dL (ref 8.9–10.3)
CO2: 22 mmol/L (ref 22–32)
CREATININE: 0.5 mg/dL (ref 0.44–1.00)
Chloride: 109 mmol/L (ref 101–111)
GFR calc Af Amer: >60 mL/min (ref >60)
GFR calc non Af Amer: >60 mL/min (ref >60)
GLUCOSE: 171 mg/dL — AB (ref 65–99)
POTASSIUM: 3.5 mmol/L (ref 3.5–5.1)
Sodium: 141 mmol/L (ref 135–145)
TOTAL PROTEIN: 7.3 g/dL (ref 6.5–8.1)

## 2015-04-04 LAB — URINE MICROSCOPIC-ADD ON

## 2015-04-04 LAB — CBG MONITORING, ED: Glucose-Capillary: 154 mg/dL — ABNORMAL HIGH (ref 65–99)

## 2015-04-04 MED ORDER — SODIUM CHLORIDE 0.9 % IV BOLUS (SEPSIS)
500.0000 mL | Freq: Once | INTRAVENOUS | Status: AC
  Administered 2015-04-04: 500 mL via INTRAVENOUS

## 2015-04-04 MED ORDER — CEPHALEXIN 500 MG PO CAPS: 500.0000 mg | ORAL_CAPSULE | Freq: Three times a day (TID) | ORAL | Status: AC

## 2015-04-04 MED ORDER — DEXTROSE 5 % IV SOLN
1.0000 g | Freq: Once | INTRAVENOUS | Status: AC
  Administered 2015-04-04: 1 g via INTRAVENOUS
  Filled 2015-04-04: qty 10

## 2015-04-04 NOTE — ED Notes (Signed)
Pt in NAD,pt has eaten and family requested she be taken back by [facility] as usual

## 2015-04-04 NOTE — ED Notes (Signed)
Pt attending a wedding indoors and began feeling very hot and had what family described as a petite mal seizure. Was post- ictal for approx 10 min. EMS witnessed second seizure with mild head twitching, absent stare, unresponsive to stimuli approx 30 min after first seizure. Family sts she will have seizures when sitting in her wheelchair for long periods of time, which she had been today. Hx of MS, normally wheelchair bound. Pt reports being compliant with her anti seizure meds.

## 2015-04-04 NOTE — ED Provider Notes (Signed)
CSN: [id number]     Arrival date & time 04/04/15  1809 History   First MD Initiated Contact with Patient 04/04/15 1818     Chief Complaint  Patient presents with  . Seizures     (Consider location/radiation/quality/duration/timing/severity/associated sxs/prior Treatment) Patient is a 74 y.o. female presenting with seizures. The history is provided by the patient and a caregiver.  Seizures Seizure activity on arrival: no   Seizure type: staring. Initial focality:  None Episode characteristics: no abnormal movements   Episode characteristics comment:  Staring Return to baseline: yes   Severity:  Mild Duration:  30 seconds Timing:  Once Progression:  Resolved Context comment:  Not sleeping well, sitting up for hours Recent head injury:  No recent head injuries PTA treatment:  None History of seizures: yes     Past Medical History  Diagnosis Date  . Hypertension   . MS (multiple sclerosis)   . Diabetes mellitus   . Goiter   . Hyperlipidemia   . Myocardial infarction   . Anxiety   . Stroke   . Syncope     recurrent  . Bilateral chronic knee pain   . Muscular dystrophy    Past Surgical History  Procedure Laterality Date  . Abdominal hysterectomy    . Cholecystectomy N/A 10/23/2014    Procedure: LAPAROSCOPIC CHOLECYSTECTOMY WITH INTRAOPERATIVE CHOLANGIOGRAM;  Surgeon: Harriette Bouillon, MD;  Location: [facility] OR;  Service: General;  Laterality: N/A;   Family History  Problem Relation Age of Onset  . Diabetes Father    History  Substance Use Topics  . Smoking status: Never Smoker   . Smokeless tobacco: Never Used  . Alcohol Use: No   OB History    No data available     Review of Systems  Constitutional: Negative for fever and fatigue.  HENT: Negative for congestion and drooling.   Eyes: Negative for pain.  Respiratory: Negative for cough and shortness of breath.   Cardiovascular: Negative for chest pain.  Gastrointestinal: Negative for nausea, vomiting, abdominal  pain and diarrhea.  Genitourinary: Negative for dysuria and hematuria.  Musculoskeletal: Negative for back pain, gait problem and neck pain.  Skin: Negative for color change.  Neurological: Positive for seizures and syncope. Negative for dizziness and headaches.  Hematological: Negative for adenopathy.  Psychiatric/Behavioral: Negative for behavioral problems.  All other systems reviewed and are negative.     Allergies  Sulfa drugs cross reactors and Lipitor  Home Medications   Prior to Admission medications   Medication Sig Start Date End Date Taking? Authorizing Provider  B-D INS SYRINGE 0.5CC/30GX1/2" 30G X 1/2" 0.5 ML MISC  10/28/14  Yes Historical Provider, MD  diltiazem (CARDIZEM SR) 60 MG 12 hr capsule Take 1 capsule (60 mg total) by mouth daily. 11/20/14  Yes Osvaldo Shipper, MD  HUMULIN 70/30 (70-30) 100 UNIT/ML injection Inject 15 Units into the skin daily. 03/17/14  Yes Historical Provider, MD  irbesartan (AVAPRO) 300 MG tablet Take 1 tablet (300 mg total) by mouth daily. 01/08/13  Yes Laveda Norman, MD  levETIRAcetam (KEPPRA) 250 MG tablet Take 1 tablet (250 mg total) by mouth 2 (two) times daily. 11/20/14  Yes Osvaldo Shipper, MD  potassium chloride (K-DUR) 10 MEQ tablet Take 10 mEq by mouth daily.   Yes Historical Provider, MD  traMADol (ULTRAM) 50 MG tablet Take 1 tablet (50 mg total) by mouth every 6 (six) hours as needed. Patient taking differently: Take 50 mg by mouth every 6 (six) hours as needed for  moderate pain.  10/25/14  Yes Dorothea Ogle, MD   BP 159/78 mmHg  Pulse 71  Temp(Src) 98.4 F (36.9 C) (Oral)  Resp 18  SpO2 100% Physical Exam  Constitutional: She is oriented to person, place, and time. She appears well-developed and well-nourished.  HENT:  Head: Normocephalic.  Mouth/Throat: Oropharynx is clear and moist. No oropharyngeal exudate.  Eyes: Conjunctivae and EOM are normal. Pupils are equal, round, and reactive to light.  Neck: Normal range of motion. Neck  supple.  Cardiovascular: Normal rate, regular rhythm, normal heart sounds and intact distal pulses.  Exam reveals no gallop and no friction rub.   No murmur heard. Pulmonary/Chest: Effort normal and breath sounds normal. No respiratory distress. She has no wheezes.  Abdominal: Soft. Bowel sounds are normal. There is no tenderness. There is no rebound and no guarding.  Musculoskeletal: Normal range of motion. She exhibits no edema or tenderness.  Neurological: She is alert and oriented to person, place, and time.  alert, oriented x3 speech: normal in context and clarity memory: intact grossly cranial nerves II-XII: intact motor strength: 5/5 in RUE, 4/5 in LUE, 1/5 in LE's  sensation: intact to light touch diffusely  cerebellar: finger-to-nose intact gait: non-ambulatory  Skin: Skin is warm and dry.  Psychiatric: She has a normal mood and affect. Her behavior is normal.  Nursing note and vitals reviewed.   ED Course  Procedures (including critical care time) Labs Review Labs Reviewed  COMPREHENSIVE METABOLIC PANEL - Abnormal; Notable for the following:    Glucose, Bld 171 (*)    BUN 21 (*)    ALT 12 (*)    All other components within normal limits  URINALYSIS, ROUTINE W REFLEX MICROSCOPIC (NOT AT [facility]) - Abnormal; Notable for the following:    Color, Urine BROWN (*)    APPearance TURBID (*)    Hgb urine dipstick LARGE (*)    Protein, ur >300 (*)    Leukocytes, UA LARGE (*)    All other components within normal limits  URINE MICROSCOPIC-ADD ON - Abnormal; Notable for the following:    Squamous Epithelial / LPF FIELD OBSCURED BY WBC'S (*)    Bacteria, UA FIELD OBSCURED BY WBC'S (*)    All other components within normal limits  CBG MONITORING, ED - Abnormal; Notable for the following:    Glucose-Capillary 154 (*)    All other components within normal limits  URINE CULTURE  CBC WITH DIFFERENTIAL/PLATELET    Imaging Review No results found.   EKG Interpretation None       MDM   Final diagnoses:  UTI (lower urinary tract infection)  Syncope, unspecified syncope type    7:16 PM 74 y.o. female w hx of HTN, MS, DM who presents with a staring spell which occurred around 4:30 PM today while she was in a seated position.  The family states that she was at an indoor wetting and felt hot.  A staring spell lasted approximately 30 seconds and then resolved. No shaking movements per family on my discussed w/ them. They note that she was not postictal and seemed to respond soon after the episode.  They state that she has had several similar episodes in the past after sitting up for long periods of time.  She was admitted and evaluated by neurology in March of this year.  She had a noncontributory EEG at that time.  She is on Keppra.  She has some chronic knee pain but otherwise has no complaints currently  on exam.  She is at her mental baseline per the family.  She states that she has had some difficulty sleeping over the last few nights.  She denies any head injuries or fevers.  She has also had some mild dysuria.  VS unremarkable here.  We'll get screening lab work.  11:47 PM: Given rocephin for UTI. Will send urine cx. Pt continues to appear well. Syncope vs seizure. Will tx w/ keflex at home. I have discussed the diagnosis/risks/treatment options with the patient and family and believe the pt to be eligible for discharge home to follow-up with her neurologist as needed. We also discussed returning to the ED immediately if new or worsening sx occur. We discussed the sx which are most concerning (e.g., further seizure or syncope episodes, fever) that necessitate immediate return. Medications administered to the patient during their visit and any new prescriptions provided to the patient are listed below.  Medications given during this visit Medications  sodium chloride 0.9 % bolus 500 mL (0 mLs Intravenous Stopped 04/04/15 2237)  cefTRIAXone (ROCEPHIN) 1 g in dextrose 5 % 50 mL  IVPB (0 g Intravenous Stopped 04/04/15 2314)    New Prescriptions   CEPHALEXIN (KEFLEX) 500 MG CAPSULE    Take 1 capsule (500 mg total) by mouth 3 (three) times daily.     Purvis Sheffield, MD 04/04/15 2348

## 2015-04-04 NOTE — ED Notes (Signed)
Bed: UJ81 Expected date: 04/04/15 Expected time: 5:46 PM Means of arrival: Ambulance Comments: Post seizure

## 2015-04-05 NOTE — ED Notes (Signed)
Pt cleaned by staff and placed in home gown prior to leaving per family request

## 2015-04-06 LAB — URINE CULTURE
Culture: NO GROWTH
SPECIAL REQUESTS: NORMAL

## 2015-07-15 ENCOUNTER — Emergency Department (HOSPITAL_COMMUNITY): Payer: Medicare Other

## 2015-07-15 ENCOUNTER — Encounter (HOSPITAL_COMMUNITY): Payer: Self-pay | Admitting: Emergency Medicine

## 2015-07-15 ENCOUNTER — Inpatient Hospital Stay (HOSPITAL_COMMUNITY)
Admission: EM | Admit: 2015-07-15 | Discharge: 2015-07-17 | DRG: 312 | Disposition: A | Discharge status: Home Health | Payer: Medicare Other | Attending: Internal Medicine | Admitting: Internal Medicine

## 2015-07-15 DIAGNOSIS — I10 Essential (primary) hypertension: Secondary | ICD-10-CM | POA: Diagnosis present

## 2015-07-15 DIAGNOSIS — Z882 Allergy status to sulfonamides status: Secondary | ICD-10-CM

## 2015-07-15 DIAGNOSIS — E876 Hypokalemia: Secondary | ICD-10-CM | POA: Diagnosis present

## 2015-07-15 DIAGNOSIS — Z66 Do not resuscitate: Secondary | ICD-10-CM | POA: Diagnosis present

## 2015-07-15 DIAGNOSIS — N898 Other specified noninflammatory disorders of vagina: Secondary | ICD-10-CM | POA: Diagnosis present

## 2015-07-15 DIAGNOSIS — F419 Anxiety disorder, unspecified: Secondary | ICD-10-CM | POA: Diagnosis present

## 2015-07-15 DIAGNOSIS — Z888 Allergy status to other drugs, medicaments and biological substances status: Secondary | ICD-10-CM

## 2015-07-15 DIAGNOSIS — R55 Syncope and collapse: Principal | ICD-10-CM | POA: Diagnosis present

## 2015-07-15 DIAGNOSIS — L89302 Pressure ulcer of unspecified buttock, stage 2: Secondary | ICD-10-CM | POA: Diagnosis present

## 2015-07-15 DIAGNOSIS — N760 Acute vaginitis: Secondary | ICD-10-CM | POA: Diagnosis present

## 2015-07-15 DIAGNOSIS — Z833 Family history of diabetes mellitus: Secondary | ICD-10-CM

## 2015-07-15 DIAGNOSIS — Z794 Long term (current) use of insulin: Secondary | ICD-10-CM | POA: Diagnosis not present

## 2015-07-15 DIAGNOSIS — R569 Unspecified convulsions: Secondary | ICD-10-CM

## 2015-07-15 DIAGNOSIS — E785 Hyperlipidemia, unspecified: Secondary | ICD-10-CM | POA: Diagnosis present

## 2015-07-15 DIAGNOSIS — R609 Edema, unspecified: Secondary | ICD-10-CM

## 2015-07-15 DIAGNOSIS — E7800 Pure hypercholesterolemia, unspecified: Secondary | ICD-10-CM | POA: Diagnosis present

## 2015-07-15 DIAGNOSIS — N319 Neuromuscular dysfunction of bladder, unspecified: Secondary | ICD-10-CM | POA: Diagnosis present

## 2015-07-15 DIAGNOSIS — E119 Type 2 diabetes mellitus without complications: Secondary | ICD-10-CM

## 2015-07-15 DIAGNOSIS — G35 Multiple sclerosis: Secondary | ICD-10-CM | POA: Diagnosis present

## 2015-07-15 DIAGNOSIS — I252 Old myocardial infarction: Secondary | ICD-10-CM | POA: Diagnosis not present

## 2015-07-15 DIAGNOSIS — K592 Neurogenic bowel, not elsewhere classified: Secondary | ICD-10-CM | POA: Diagnosis present

## 2015-07-15 DIAGNOSIS — E1140 Type 2 diabetes mellitus with diabetic neuropathy, unspecified: Secondary | ICD-10-CM

## 2015-07-15 DIAGNOSIS — Z8673 Personal history of transient ischemic attack (TIA), and cerebral infarction without residual deficits: Secondary | ICD-10-CM

## 2015-07-15 DIAGNOSIS — L98411 Non-pressure chronic ulcer of buttock limited to breakdown of skin: Secondary | ICD-10-CM | POA: Diagnosis present

## 2015-07-15 DIAGNOSIS — I82401 Acute embolism and thrombosis of unspecified deep veins of right lower extremity: Secondary | ICD-10-CM | POA: Diagnosis present

## 2015-07-15 DIAGNOSIS — Z79899 Other long term (current) drug therapy: Secondary | ICD-10-CM

## 2015-07-15 DIAGNOSIS — I959 Hypotension, unspecified: Secondary | ICD-10-CM

## 2015-07-15 LAB — BASIC METABOLIC PANEL
ANION GAP: 11 (ref 5–15)
BUN: 12 mg/dL (ref 6–20)
CALCIUM: 9.1 mg/dL (ref 8.9–10.3)
CO2: 29 mmol/L (ref 22–32)
Chloride: 102 mmol/L (ref 101–111)
Creatinine, Ser: 0.56 mg/dL (ref 0.44–1.00)
GFR calc Af Amer: >60 mL/min (ref >60)
Glucose, Bld: 191 mg/dL — ABNORMAL HIGH (ref 65–99)
POTASSIUM: 3.1 mmol/L — AB (ref 3.5–5.1)
SODIUM: 142 mmol/L (ref 135–145)

## 2015-07-15 LAB — CBC WITH DIFFERENTIAL/PLATELET
BASOS ABS: 0 10*3/uL (ref 0.0–0.1)
BASOS PCT: 0 %
EOS PCT: 2 %
Eosinophils Absolute: 0.1 10*3/uL (ref 0.0–0.7)
HEMATOCRIT: 40.2 % (ref 36.0–46.0)
Hemoglobin: 13.5 g/dL (ref 12.0–15.0)
LYMPHS PCT: 23 %
Lymphs Abs: 1.8 10*3/uL (ref 0.7–4.0)
MCH: 28.4 pg (ref 26.0–34.0)
MCHC: 33.6 g/dL (ref 30.0–36.0)
MCV: 84.5 fL (ref 78.0–100.0)
MONO ABS: 0.6 10*3/uL (ref 0.1–1.0)
Monocytes Relative: 7 %
NEUTROS ABS: 5.3 10*3/uL (ref 1.7–7.7)
Neutrophils Relative %: 68 %
PLATELETS: 182 10*3/uL (ref 150–400)
RBC: 4.76 MIL/uL (ref 3.87–5.11)
RDW: 13.8 % (ref 11.5–15.5)
WBC: 7.8 10*3/uL (ref 4.0–10.5)

## 2015-07-15 LAB — URINALYSIS, ROUTINE W REFLEX MICROSCOPIC
BILIRUBIN URINE: NEGATIVE
Glucose, UA: 100 mg/dL — AB
KETONES UR: NEGATIVE mg/dL
LEUKOCYTES UA: NEGATIVE
NITRITE: NEGATIVE
PH: 6 (ref 5.0–8.0)
Protein, ur: 30 mg/dL — AB
Specific Gravity, Urine: 1.012 (ref 1.005–1.030)
UROBILINOGEN UA: 1 mg/dL (ref 0.0–1.0)

## 2015-07-15 LAB — GLUCOSE, CAPILLARY: GLUCOSE-CAPILLARY: 204 mg/dL — AB (ref 65–99)

## 2015-07-15 LAB — WET PREP, GENITAL
Trich, Wet Prep: NONE SEEN
Yeast Wet Prep HPF POC: NONE SEEN

## 2015-07-15 LAB — I-STAT TROPONIN, ED: TROPONIN I, POC: 0.03 ng/mL (ref 0.00–0.08)

## 2015-07-15 LAB — URINE MICROSCOPIC-ADD ON

## 2015-07-15 LAB — POC OCCULT BLOOD, ED: FECAL OCCULT BLD: NEGATIVE

## 2015-07-15 LAB — PROTIME-INR
INR: 1.16 (ref 0.00–1.49)
PROTHROMBIN TIME: 14.9 s (ref 11.6–15.2)

## 2015-07-15 MED ORDER — METRONIDAZOLE 500 MG PO TABS
500.0000 mg | ORAL_TABLET | Freq: Two times a day (BID) | ORAL | Status: DC
  Administered 2015-07-15: 500 mg via ORAL
  Filled 2015-07-15: qty 1

## 2015-07-15 MED ORDER — POTASSIUM CHLORIDE CRYS ER 20 MEQ PO TBCR
40.0000 meq | EXTENDED_RELEASE_TABLET | Freq: Once | ORAL | Status: AC
  Administered 2015-07-15: 40 meq via ORAL
  Filled 2015-07-15: qty 2

## 2015-07-15 MED ORDER — IRBESARTAN 300 MG PO TABS
300.0000 mg | ORAL_TABLET | Freq: Every day | ORAL | Status: DC
  Administered 2015-07-16 – 2015-07-17 (×2): 300 mg via ORAL
  Filled 2015-07-15 (×2): qty 2
  Filled 2015-07-15 (×2): qty 1

## 2015-07-15 MED ORDER — HEPARIN SODIUM (PORCINE) 5000 UNIT/ML IJ SOLN
5000.0000 [IU] | Freq: Three times a day (TID) | INTRAMUSCULAR | Status: DC
  Filled 2015-07-15 (×2): qty 1

## 2015-07-15 MED ORDER — SODIUM CHLORIDE 0.9 % IV BOLUS (SEPSIS)
1000.0000 mL | Freq: Once | INTRAVENOUS | Status: AC
  Administered 2015-07-15: 1000 mL via INTRAVENOUS

## 2015-07-15 MED ORDER — ONDANSETRON HCL 4 MG/2ML IJ SOLN: 4.0000 mg | Freq: Four times a day (QID) | INTRAMUSCULAR | Status: DC | PRN

## 2015-07-15 MED ORDER — ONDANSETRON HCL 4 MG PO TABS: 4.0000 mg | ORAL_TABLET | Freq: Four times a day (QID) | ORAL | Status: DC | PRN

## 2015-07-15 MED ORDER — SODIUM CHLORIDE 0.9 % IJ SOLN
3.0000 mL | Freq: Two times a day (BID) | INTRAMUSCULAR | Status: DC
  Administered 2015-07-15 – 2015-07-17 (×4): 3 mL via INTRAVENOUS

## 2015-07-15 MED ORDER — LEVETIRACETAM 250 MG PO TABS
250.0000 mg | ORAL_TABLET | Freq: Two times a day (BID) | ORAL | Status: DC
  Administered 2015-07-15 – 2015-07-17 (×4): 250 mg via ORAL
  Filled 2015-07-15 (×5): qty 1

## 2015-07-15 MED ORDER — INSULIN ASPART 100 UNIT/ML ~~LOC~~ SOLN
0.0000 [IU] | Freq: Three times a day (TID) | SUBCUTANEOUS | Status: DC
  Administered 2015-07-16: 2 [IU] via SUBCUTANEOUS
  Administered 2015-07-16 – 2015-07-17 (×3): 3 [IU] via SUBCUTANEOUS
  Administered 2015-07-17: 5 [IU] via SUBCUTANEOUS

## 2015-07-15 MED ORDER — DILTIAZEM HCL ER 60 MG PO CP12
60.0000 mg | ORAL_CAPSULE | Freq: Every day | ORAL | Status: DC
  Administered 2015-07-16 – 2015-07-17 (×2): 60 mg via ORAL
  Filled 2015-07-15 (×2): qty 1

## 2015-07-15 NOTE — ED Notes (Signed)
Pt to ED via [facility] from home, lives with daughter, family called EMS because of bed sores on buttocks, when EMS was picking up pt to transfer, pt had an episode of syncope lasting 20 seconds. Pt also has hx of seizures, petite mal-- staring off , EMS stated that was what pt was doing at the time. On arrival to ED, pt is alert, oriented x 4, has stage 2 decubitus on left buttock and mid buttock area.

## 2015-07-15 NOTE — Plan of Care (Signed)
Problem: Skin Integrity: Goal: Risk for impaired skin integrity will decrease Outcome: Progressing Pt currently has skin breakdown; Braden Scale interventions have been implemented.

## 2015-07-15 NOTE — H&P (Signed)
[facility] History and Physical  Hannah Lamb ZOX:[id number] DOB: 1941/01/17 DOA: 07/15/2015  Referring physician: ED physician PCP: Lonia Blood, MD  Specialists:   Chief Complaint: Syncope, decubitus ulcer of buttock, vaginal discharge  HPI: Hannah Lamb is a 74 y.o. female with PMH of recurrent syncope, hypertension, hyperlipidemia, diabetes mellitus, MS, goiter, LAD, MI, stroke, chronic of bilateral knee pain, seizure, anxiety, who presents with syncope, decubitus ulcer, vaginal discharge.  Patient's daughter reports that pt developed sores on her bottom 2 days ago. Her daughter attribute this to poor care from home health nurse. She also reports pt has been having 2 days of vaginal discharge, which has bad smell. They called EMS to take pt to hospital. When EMS arrived, they helped to move her to a chair when she had a syncopal episode lasting approximately 5-10 seconds. They report her blood pressure dropped to 92/48 during the syncopal episode. She did not fall during the episode.They report after she came around she was alert and oriented 4. Her daughter reports that patient has recurrent syncope episodes in the past which was believed to be due to vasovagal syncope. Per her daughter, patient can easily develop syncope if she is sitting up for a while. This is pt's baseline issue. Her daughter is not concerned about her syncope episode. She did not have chest pain, shortness of breath, palpitation, unilateral weakness, vision change or hearing loss. When I saw pt in ED, the patient denies any complaints and reports she's been feeling well. The patient denies fevers, cough, chest pain, shortness of breath, headache, double vision, abdominal pain, nausea, vomiting, diarrhea, hematemesis, hematochezia, or rashes.  In ED, patient was found to have few clue cells in Wet prep, negative urinalysis (urine sample was dirty catch with many squamous cell), negative troponin, WBC 7.8, temperature  normal, no tachycardia, electrolytes okay. Chest x-ray showed no acute infiltrate or pulmonary edema. Mild thoracic dextroscoliosis. Aneurysmal dilatation of thoracic aorta. Vague nodular density left perihilar measures 7.3 mm. Further evaluation with CT scan of the chest is recommended.  Where does patient live?   At home  Can patient participate in ADLs?   None  Review of Systems:   General: no fevers, chills, no changes in body weight, has poor appetite, has fatigue HEENT: no blurry vision, hearing changes or sore throat Pulm: no dyspnea, coughing, wheezing CV: no chest pain, palpitations Abd: no nausea, vomiting, abdominal pain, diarrhea, constipation GU: no dysuria, burning on urination, increased urinary frequency, hematuria. Has vaginal discharge.  Ext: no leg edema Neuro: no unilateral weakness, numbness, or tingling, no vision change or hearing loss. Had syncope episode. Skin: no rash. Has decubitus ulcer in buttock MSK: No muscle spasm, no deformity, no limitation of range of movement in spin Heme: No easy bruising.  Travel history: No recent long distant travel.  Allergy:  Allergies  Allergen Reactions  . Sulfa Drugs Cross Reactors Other (See Comments)    dizziness  . Lipitor [Atorvastatin] Other (See Comments)    Muscle weakness    Past Medical History  Diagnosis Date  . Hypertension   . MS (multiple sclerosis) (HCC)   . Diabetes mellitus   . Goiter   . Hyperlipidemia   . Myocardial infarction (HCC)   . Anxiety   . Stroke (HCC)   . Syncope     recurrent  . Bilateral chronic knee pain   . Muscular dystrophy [facility])     Past Surgical History  Procedure Laterality Date  . Abdominal hysterectomy    .  Cholecystectomy N/A 10/23/2014    Procedure: LAPAROSCOPIC CHOLECYSTECTOMY WITH INTRAOPERATIVE CHOLANGIOGRAM;  Surgeon: Harriette Bouillon, MD;  Location: [facility] OR;  Service: General;  Laterality: N/A;    Social History:  reports that she has never smoked. She has never  used smokeless tobacco. She reports that she does not drink alcohol or use illicit drugs.  Family History:  Family History  Problem Relation Age of Onset  . Diabetes Father      Prior to Admission medications   Medication Sig Start Date End Date Taking? Authorizing Provider  diltiazem (CARDIZEM SR) 60 MG 12 hr capsule Take 1 capsule (60 mg total) by mouth daily. 11/20/14  Yes Osvaldo Shipper, MD  HUMULIN 70/30 (70-30) 100 UNIT/ML injection Inject 15 Units into the skin daily. 03/17/14  Yes Historical Provider, MD  irbesartan (AVAPRO) 300 MG tablet Take 1 tablet (300 mg total) by mouth daily. 01/08/13  Yes Laveda Norman, MD  levETIRAcetam (KEPPRA) 250 MG tablet Take 1 tablet (250 mg total) by mouth 2 (two) times daily. 11/20/14  Yes Osvaldo Shipper, MD  potassium chloride (K-DUR) 10 MEQ tablet Take 10 mEq by mouth daily.   Yes Historical Provider, MD  B-D INS SYRINGE 0.5CC/30GX1/2" 30G X 1/2" 0.5 ML MISC  10/28/14   Historical Provider, MD  traMADol (ULTRAM) 50 MG tablet Take 1 tablet (50 mg total) by mouth every 6 (six) hours as needed. Patient not taking: Reported on 07/15/2015 10/25/14   Dorothea Ogle, MD    Physical Exam: Filed Vitals:   07/15/15 1800 07/15/15 1830 07/15/15 1930 07/15/15 1956  BP: 163/75 165/75 161/73 160/72  Pulse: 82 79 77 79  Temp:      TempSrc:      Resp: SpO2: 98% 98% 98% 100%   General: Not in acute distress HEENT:       Eyes: PERRL, EOMI, no scleral icterus.       ENT: No discharge from the ears and nose, no pharynx injection, no tonsillar enlargement.        Neck: No JVD, no bruit, no mass felt. Heme: No neck lymph node enlargement. Cardiac: S1/S2, RRR, No murmurs, No gallops or rubs. Pulm: No rales, wheezing, rhonchi or rubs. Abd: Soft, nondistended, nontender, no rebound pain, no organomegaly, BS present. Ext: No pitting leg edema bilaterally. 2+DP/PT pulse bilaterally. Musculoskeletal: No joint deformities, No joint redness or warmth, no  limitation of ROM in spin. Skin: No rashes. Stage II decubitus ulcer in buttock Neuro: Alert, oriented X3, cranial nerves II-XII grossly intact, muscle strength 5/5 in all extremities, sensation to light touch intact. Brachial reflex 2+ bilaterally. Knee reflex 1+ bilaterally. Negative Babinski's sign. Normal finger to nose test. Psych: Patient is not psychotic, no suicidal or hemocidal ideation.  Labs on Admission:  Basic Metabolic Panel:  Recent Labs Lab 07/15/15 1425  NA 142  K 3.1*  CL 102  CO2 29  GLUCOSE 191*  BUN 12  CREATININE 0.56  CALCIUM 9.1   Liver Function Tests: No results for input(s): AST, ALT, ALKPHOS, BILITOT, PROT, ALBUMIN in the last 168 hours. No results for input(s): LIPASE, AMYLASE in the last 168 hours. No results for input(s): AMMONIA in the last 168 hours. CBC:  Recent Labs Lab 07/15/15 1425  WBC 7.8  NEUTROABS 5.3  HGB 13.5  HCT 40.2  MCV 84.5  PLT 182   Cardiac Enzymes: No results for input(s): CKTOTAL, CKMB, CKMBINDEX, TROPONINI in the last 168 hours.  BNP (last 3  results) No results for input(s): BNP in the last 8760 hours.  ProBNP (last 3 results) No results for input(s): PROBNP in the last 8760 hours.  CBG: No results for input(s): GLUCAP in the last 168 hours.  Radiological Exams on Admission: Dg Chest 2 View  07/15/2015  CLINICAL DATA:  Syncope, altered mental status EXAM: CHEST  2 VIEW COMPARISON:  11/18/2014 FINDINGS: Borderline cardiomegaly. Mild thoracic dextroscoliosis. There is aneurysmal dilatation of thoracic aorta measures 3.8 cm in diameter. No acute infiltrate or pulmonary edema. There is vague nodular density left perihilar measures 7.3 mm. Further correlation with CT scan of the chest is recommended. Degenerative changes and osteopenia thoracic spine. IMPRESSION: No acute infiltrate or pulmonary edema. Mild thoracic dextroscoliosis. Aneurysmal dilatation of thoracic aorta. Vague nodular density left perihilar measures  7.3 mm. Further evaluation with CT scan of the chest is recommended. Electronically Signed   By: Natasha Mead M.D.   On: 07/15/2015 15:09    EKG: Independently reviewed.  QTC 451, LAD, mild T-wave inversion in inferior leads.  Assessment/Plan Principal Problem:   Syncope Active Problems:   HYPERCHOLESTEROLEMIA   Multiple sclerosis (HCC)   History of CVA (cerebrovascular accident)   Hypertension   Diabetes mellitus type 2, controlled (HCC)   Hypokalemia   Seizure (HCC)   Decubitus ulcer of buttock, stage 2   Vaginal discharge   Syncope: This is a chronic and recurrent issue, likely due to vasovagal syncope. No focal neurologic findings on physical examination. Patient has a history of seizure, which is a potential differential diagnosis though less likely. 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 MRI on 11/18/14 showed extensive chronic white matter changes. Currently not on any medications. - May need to f/u neuro as outpt  HTN: -on Cardizem, irbvesartan  DM-II: Last A1c 7.1 on 11/18/14, fairly well controled. Patient is taking Humulin at home -SSI  Hypokalemia: K= 3.1 on admission. - Repleted - Check Mg level  Decubitus ulcer of buttock, stage 2: - Wound care consult  Vaginal discharge: Wet prep showed few clue cells, indicating possible BV. -Flagyl 500 mg bid x 7 day. - repeat UA  DVT ppx: SQ Heparin  Code Status:DNR Family Communication:   Yes, patient's daughter at bed side Disposition Plan: Admit to inpatient   Date of Service 07/15/2015    Lorretta Harp [facility] Pager [phone]  If 7PM-7AM, please contact  night-coverage [email or web address] Password [email or web address] 07/15/2015, 9:10 PM

## 2015-07-15 NOTE — ED Provider Notes (Signed)
CSN: [id number]     Arrival date & time 07/15/15  1400 History   First MD Initiated Contact with Patient 07/15/15 1409     Chief Complaint  Patient presents with  . Loss of Consciousness  . Vaginal Discharge  . Skin Ulcer   Hannah Lamb is a 74 y.o. female with history of hypertension, MS, diabetes, hyperlipidemia, MI, stroke, and muscular dystrophy who presents to the emergency department by EMS with bedsores on her buttocks as well as after having a syncopal episode today. The patient lives at home with her daughter who reports that she is nonambulatory. Therefore they first noticed sores on her bottom 2 days ago. They also reports she's been having 2 days of vaginal discharge. When EMS arrived they helped to move her to a chair when she had a syncopal episode lasting approximately 5-10 seconds. They report that it took her approximately 5 minutes to start speaking again. They report her blood pressure dropped to 92/48 during the syncopal episode. She did not fall during the episode.  They report after she came around she was alert and oriented 4. The patient denies any complaints and reports she's been feeling well. The patient denies fevers, cough, chest pain, shortness of breath, headache, double vision, abdominal pain, nausea, vomiting, diarrhea, hematemesis, hematochezia, or rashes.  (Consider location/radiation/quality/duration/timing/severity/associated sxs/prior Treatment) The history is provided by the patient and a caregiver. No language interpreter was used.    Past Medical History  Diagnosis Date  . Hypertension   . MS (multiple sclerosis) (HCC)   . Diabetes mellitus   . Goiter   . Hyperlipidemia   . Myocardial infarction (HCC)   . Anxiety   . Stroke (HCC)   . Syncope     recurrent  . Bilateral chronic knee pain   . Muscular dystrophy [facility])    Past Surgical History  Procedure Laterality Date  . Abdominal hysterectomy    . Cholecystectomy N/A 10/23/2014    Procedure:  LAPAROSCOPIC CHOLECYSTECTOMY WITH INTRAOPERATIVE CHOLANGIOGRAM;  Surgeon: Harriette Bouillon, MD;  Location: [facility] OR;  Service: General;  Laterality: N/A;   Family History  Problem Relation Age of Onset  . Diabetes Father    Social History  Substance Use Topics  . Smoking status: Never Smoker   . Smokeless tobacco: Never Used  . Alcohol Use: No   OB History    No data available     Review of Systems  Constitutional: Negative for fever and chills.  HENT: Negative for congestion and sore throat.   Eyes: Negative for visual disturbance.  Respiratory: Negative for cough, shortness of breath and wheezing.   Cardiovascular: Negative for chest pain and palpitations.  Gastrointestinal: Negative for nausea, vomiting, abdominal pain, diarrhea and blood in stool.  Genitourinary: Positive for vaginal discharge. Negative for dysuria, decreased urine volume, vaginal bleeding and difficulty urinating.  Musculoskeletal: Negative for back pain and neck pain.  Skin: Positive for color change, rash and wound. Negative for pallor.  Neurological: Positive for syncope. Negative for headaches.      Allergies  Sulfa drugs cross reactors and Lipitor  Home Medications   Prior to Admission medications   Medication Sig Start Date End Date Taking? Authorizing Provider  B-D INS SYRINGE 0.5CC/30GX1/2" 30G X 1/2" 0.5 ML MISC  10/28/14   Historical Provider, MD  diltiazem (CARDIZEM SR) 60 MG 12 hr capsule Take 1 capsule (60 mg total) by mouth daily. 11/20/14   Osvaldo Shipper, MD  HUMULIN 70/30 (70-30) 100 UNIT/ML injection  Inject 15 Units into the skin daily. 03/17/14   Historical Provider, MD  irbesartan (AVAPRO) 300 MG tablet Take 1 tablet (300 mg total) by mouth daily. 01/08/13   Laveda Norman, MD  levETIRAcetam (KEPPRA) 250 MG tablet Take 1 tablet (250 mg total) by mouth 2 (two) times daily. 11/20/14   Osvaldo Shipper, MD  potassium chloride (K-DUR) 10 MEQ tablet Take 10 mEq by mouth daily.    Historical Provider, MD   traMADol (ULTRAM) 50 MG tablet Take 1 tablet (50 mg total) by mouth every 6 (six) hours as needed. Patient taking differently: Take 50 mg by mouth every 6 (six) hours as needed for moderate pain.  10/25/14   Dorothea Ogle, MD   BP 128/92 mmHg  Pulse 79  Temp(Src) 99.1 F (37.3 C) (Rectal)  Resp 20  SpO2 96% Physical Exam  Constitutional: She is oriented to person, place, and time. She appears well-developed and well-nourished. No distress.  Nontoxic appearing.  HENT:  Head: Normocephalic and atraumatic.  Right Ear: External ear normal.  Left Ear: External ear normal.  Mouth/Throat: Oropharynx is clear and moist.  Eyes: Conjunctivae and EOM are normal. Pupils are equal, round, and reactive to light. Right eye exhibits no discharge. Left eye exhibits no discharge.  Neck: Normal range of motion. Neck supple. No JVD present. No tracheal deviation present.  Cardiovascular: Normal rate, regular rhythm and intact distal pulses.  Exam reveals no gallop and no friction rub.   Murmur heard. Blowing systolic murmur noted. Bilateral radial, posterior tibialis and dorsalis pedis pulses are intact.    Pulmonary/Chest: Effort normal and breath sounds normal. No respiratory distress. She has no wheezes. She has no rales.  Lungs clear to auscultation bilaterally.  Abdominal: Soft. Bowel sounds are normal. She exhibits no distension. There is no tenderness. There is no guarding.  Abdomen is soft and nontender to palpation.  Genitourinary:  On GU exam the patient does appear to have an ulcer left inguinal area. Patient has white vaginal discharge which appears consistent with yeast infection. No adnexal tenderness to palpation.  Musculoskeletal: She exhibits no edema or tenderness.  No lower extremity edema or tenderness.  Lymphadenopathy:    She has no cervical adenopathy.  Neurological: She is alert and oriented to person, place, and time. Coordination normal.  The patient is alert and oriented 3.  Cranial nerves are intact. Her sensation is intact to her bilateral upper and lower extremities.  Skin: Skin is warm and dry. No rash noted. She is not diaphoretic. There is erythema. No pallor.  Patient has one 3 cm decubitus ulcer to her left upper buttocks and one 3 cm ulcer along the midline of her upper buttocks. They are draining serous fluid.   Psychiatric: She has a normal mood and affect. Her behavior is normal.  Nursing note and vitals reviewed.   ED Course  Procedures (including critical care time) Labs Review Labs Reviewed  WET PREP, GENITAL  OCCULT BLOOD X 1 CARD TO LAB, STOOL  BASIC METABOLIC PANEL  CBC WITH DIFFERENTIAL/PLATELET  URINALYSIS, ROUTINE W REFLEX MICROSCOPIC (NOT AT [facility])  CBG MONITORING, ED  I-STAT TROPOININ, ED  GC/CHLAMYDIA PROBE AMP ([place]) NOT AT [facility]    Imaging Review No results found. I have personally reviewed and evaluated these images and lab results as part of my medical decision-making.   EKG Interpretation   Date/Time:  Wednesday July 15 2015 14:12:16 EDT Ventricular Rate:  76 PR Interval:  181 QRS Duration: 103  QT Interval:  401 QTC Calculation: 451 R Axis:   -41 Text Interpretation:  Sinus rhythm Left anterior fascicular block Abnormal  R-wave progression, late transition LVH with secondary repolarization  abnormality Anterior ST elevation, probably due to LVH Baseline wander in  lead(s) I III aVL No significant change since last tracing Confirmed by  Rubin Payor  MD, Harrold Donath [phone]) on 07/15/2015 2:21:41 PM      Filed Vitals:   07/15/15 1405 07/15/15 1432  BP:  128/92  Pulse:  79  Temp:  99.1 F (37.3 C)  TempSrc:  Rectal  Resp:  20  SpO2: 96% 96%     MDM   Meds given in ED:  Medications  sodium chloride 0.9 % bolus 1,000 mL (not administered)    New Prescriptions   No medications on file    Final diagnoses:  Syncope and collapse  Hypotension, unspecified hypotension type   This  is a 74 y.o. female  with history of hypertension, MS, diabetes, hyperlipidemia, MI, stroke, and muscular dystrophy who presents to the emergency department by EMS with bedsores on her buttocks as well as after having a syncopal episode today. The patient lives at home with her daughter who reports that she is nonambulatory. Therefore they first noticed sores on her bottom 2 days ago. They also reports she's been having 2 days of vaginal discharge. When EMS arrived they helped to move her to a chair when she had a syncopal episode lasting approximately 5-10 seconds. They report that it took her approximately 5 minutes to start speaking again. They report her blood pressure dropped to 92/48 during the syncopal episode. She did not fall during the episode.  They report after she came around she was alert and oriented 4. Patient with two stage 2 decubitus ulcers on exam.   Plan for admission  Consulted with Dr. Gwenlyn Perking for admission who is hesitant to admit patient at this time. Plan for admitting team to see patient. At shift change Dr. Jeraldine Loots will assume care and disposition the patient.        Everlene Farrier, PA-C 07/16/15 2216  Benjiman Core, MD 07/18/15 [phone]

## 2015-07-16 ENCOUNTER — Inpatient Hospital Stay (HOSPITAL_COMMUNITY): Payer: Medicare Other

## 2015-07-16 DIAGNOSIS — R569 Unspecified convulsions: Secondary | ICD-10-CM | POA: Diagnosis not present

## 2015-07-16 DIAGNOSIS — N898 Other specified noninflammatory disorders of vagina: Secondary | ICD-10-CM | POA: Diagnosis not present

## 2015-07-16 DIAGNOSIS — R55 Syncope and collapse: Principal | ICD-10-CM

## 2015-07-16 DIAGNOSIS — E1140 Type 2 diabetes mellitus with diabetic neuropathy, unspecified: Secondary | ICD-10-CM

## 2015-07-16 LAB — CBC
HEMATOCRIT: 35 % — AB (ref 36.0–46.0)
Hemoglobin: 11.8 g/dL — ABNORMAL LOW (ref 12.0–15.0)
MCH: 28.7 pg (ref 26.0–34.0)
MCHC: 33.7 g/dL (ref 30.0–36.0)
MCV: 85.2 fL (ref 78.0–100.0)
PLATELETS: 176 10*3/uL (ref 150–400)
RBC: 4.11 MIL/uL (ref 3.87–5.11)
RDW: 14.3 % (ref 11.5–15.5)
WBC: 6.1 10*3/uL (ref 4.0–10.5)

## 2015-07-16 LAB — COMPREHENSIVE METABOLIC PANEL
ALBUMIN: 2.4 g/dL — AB (ref 3.5–5.0)
ALT: 11 U/L — AB (ref 14–54)
AST: 16 U/L (ref 15–41)
Alkaline Phosphatase: 49 U/L (ref 38–126)
Anion gap: 8 (ref 5–15)
BUN: 18 mg/dL (ref 6–20)
CHLORIDE: 106 mmol/L (ref 101–111)
CO2: 27 mmol/L (ref 22–32)
CREATININE: 0.67 mg/dL (ref 0.44–1.00)
Calcium: 8.7 mg/dL — ABNORMAL LOW (ref 8.9–10.3)
GFR calc Af Amer: >60 mL/min (ref >60)
GLUCOSE: 223 mg/dL — AB (ref 65–99)
Potassium: 3.5 mmol/L (ref 3.5–5.1)
Sodium: 141 mmol/L (ref 135–145)
Total Bilirubin: 0.5 mg/dL (ref 0.3–1.2)
Total Protein: 5.7 g/dL — ABNORMAL LOW (ref 6.5–8.1)

## 2015-07-16 LAB — GLUCOSE, CAPILLARY
GLUCOSE-CAPILLARY: 202 mg/dL — AB (ref 65–99)
Glucose-Capillary: 208 mg/dL — ABNORMAL HIGH (ref 65–99)
Glucose-Capillary: 247 mg/dL — ABNORMAL HIGH (ref 65–99)
Glucose-Capillary: 182 mg/dL — ABNORMAL HIGH (ref 65–99)

## 2015-07-16 LAB — MAGNESIUM: MAGNESIUM: 1.6 mg/dL — AB (ref 1.7–2.4)

## 2015-07-16 MED ORDER — ACETAMINOPHEN 325 MG PO TABS
650.0000 mg | ORAL_TABLET | ORAL | Status: DC | PRN
  Filled 2015-07-16 (×2): qty 2

## 2015-07-16 MED ORDER — MAGNESIUM SULFATE 2 GM/50ML IV SOLN
2.0000 g | Freq: Once | INTRAVENOUS | Status: AC
  Administered 2015-07-16: 2 g via INTRAVENOUS
  Filled 2015-07-16: qty 50

## 2015-07-16 MED ORDER — METRONIDAZOLE IN NACL 5-0.79 MG/ML-% IV SOLN
500.0000 mg | Freq: Two times a day (BID) | INTRAVENOUS | Status: DC
  Administered 2015-07-16 – 2015-07-17 (×3): 500 mg via INTRAVENOUS
  Filled 2015-07-16 (×5): qty 100

## 2015-07-16 MED ORDER — INSULIN ASPART PROT & ASPART (70-30 MIX) 100 UNIT/ML ~~LOC~~ SUSP
15.0000 [IU] | Freq: Every day | SUBCUTANEOUS | Status: DC
  Administered 2015-07-17: 15 [IU] via SUBCUTANEOUS
  Filled 2015-07-16: qty 10

## 2015-07-16 NOTE — Progress Notes (Signed)
PT Cancellation Note  Patient Details Name: Hannah Lamb MRN: [id number] DOB: 05-11-1941   Cancelled Treatment:    Reason Eval/Treat Not Completed: PT screened, no needs identified, will sign off (Per OT note pt is bedbound, never sits at EOB and depends on mechanical lift for transfers at baseline. OT addressed rolling for pressure relief with family. No acute PT needs as pt is at baseline. ) PT signing off.    Ralene Bathe Kistler 07/16/2015, 2:29 PM [phone]

## 2015-07-16 NOTE — Plan of Care (Signed)
Problem: Pain Managment: Goal: General experience of comfort will improve Outcome: Progressing PRN tylenol has been ordered

## 2015-07-16 NOTE — Plan of Care (Signed)
Problem: Education: Goal: Knowledge of [facility] Education information/materials will improve Outcome: Completed/Met Date Met:  07/16/15 Family notified about [facility] practices, materials given

## 2015-07-16 NOTE — Progress Notes (Signed)
Pt states her legs are starting to feel more painful, MD notifed, RN to monitor and wait for further orders

## 2015-07-16 NOTE — Evaluation (Signed)
Occupational Therapy Evaluation and Discharge Summary Patient Details Name: Hannah Lamb MRN: [id number] DOB: [birth year] Today's Date: 07/16/2015    History of Present Illness 74 y.o. female with PMH of recurrent syncope, hypertension, hyperlipidemia, diabetes mellitus, MS, goiter, LAD, MI, stroke, chronic of bilateral knee pain, seizure, anxiety, who presents with syncope, decubitus ulcer, vaginal discharge.   Clinical Impression   Pt admitted with the above symptoms and overall functionally is at baseline.  Pt has total care from family and 7 day a week HH AID except for feeding and grooming.  Pt continues to do these tasks.  Pt spends most of her day in the bed and if she gets up, uses a hoyer lift. Talked to pt and daughter at length about the pressure areas on pt's backside and the need to roll her from side to side more often for pressure relief.  Will sign off as this pt has no acute OT needs since functionally she is at her baseline with ADLS.    Follow Up Recommendations  No OT follow up;Supervision/Assistance - 24 hour    Equipment Recommendations  None recommended by OT    Recommendations for Other Services       Precautions / Restrictions Precautions Precautions: Fall Precaution Comments: pt non ambulatory Restrictions Weight Bearing Restrictions: No      Mobility Bed Mobility Overal bed mobility: Needs Assistance Bed Mobility: Rolling Rolling: Mod assist         General bed mobility comments: Pt never sits on EOB per daughter.  She is either sitting up in bed, laying down or in hoyer to w/c (but this is rare)  Transfers Overall transfer level: Needs assistance               General transfer comment: Pt only transfers by way of hoyer lift and with all her B knee pain, someone has to hold her legs so they do not dangle bc the pain is unbearable.    Balance                                            ADL Overall ADL's : At  baseline                                       General ADL Comments: Pt continues to be able to feed and groom.  Did talk at length to daughter about the new pressure sores on pts back side and need to rotate her from one side to another frequently.  Pt states the nurse rolls her at home when she is there in the morning and then they roll her once at night.  Spoke to her about increasing this frequency to at minimum every 2 hours.     Vision Vision Assessment?: No apparent visual deficits   Perception     Praxis      Pertinent Vitals/Pain Pain Assessment: Faces Pain Score: 10-Worst pain ever Faces Pain Scale: Hurts worst Pain Location: B knees R greater than L. Pain Descriptors / Indicators: Aching Pain Intervention(s): Limited activity within patient's tolerance;Monitored during session;Repositioned     Hand Dominance Right   Extremity/Trunk Assessment Upper Extremity Assessment Upper Extremity Assessment: LUE deficits/detail LUE Deficits / Details: Shoulder flexion limited to 90 degrees. LUE: Unable to fully assess  due to pain   Lower Extremity Assessment Lower Extremity Assessment: Defer to PT evaluation   Cervical / Trunk Assessment Cervical / Trunk Assessment: Normal   Communication Communication Communication: No difficulties   Cognition Arousal/Alertness: Awake/alert Behavior During Therapy: WFL for tasks assessed/performed Overall Cognitive Status: History of cognitive impairments - at baseline       Memory: Decreased short-term memory             General Comments       Exercises       Shoulder Instructions      Home Living Family/patient expects to be discharged to:: Private residence Living Arrangements: Children Available Help at Discharge: Family Type of Home: Apartment Home Access: Level entry     Home Layout: One level     Bathroom Shower/Tub: Other (comment) (bed bathes)   Bathroom Toilet:  (toilets in bed.)      Home Equipment: Hospital bed;Wheelchair - power   Additional Comments: Pt has power chair but family operates the chair.  Pt does not drive the chair herself.        Prior Functioning/Environment Level of Independence: Needs assistance  Gait / Transfers Assistance Needed: Pt non ambulatory.  Pt feeds self and assists with grooming tasks all in bed.  Pt, when she does get up, gets up with hoyer.  Pt has NA 2 hours a day 7 days a week and lives with daughter.  Pt is dependent for all bathing, dressing, toileting, and transfers.   ADL's / Homemaking Assistance Needed: dependent outside of grooming and feeding.        OT Diagnosis:     OT Problem List:     OT Treatment/Interventions:      OT Goals(Current goals can be found in the care plan section) Acute Rehab OT Goals Patient Stated Goal: none stated.  OT Frequency:     Barriers to D/C:            Co-evaluation              End of Session Nurse Communication: Mobility status;Need for lift equipment  Activity Tolerance: Patient limited by fatigue Patient left: in bed;with call bell/phone within reach;with family/visitor present   Time: 1200-1225 OT Time Calculation (min): 25 min Charges:  OT General Charges $OT Visit: 1 Procedure OT Evaluation $Initial OT Evaluation Tier I: 1 Procedure OT Treatments $Therapeutic Activity: 8-22 mins G-Codes:    Hope Budds 07-26-2015, 12:35 PM  [phone]

## 2015-07-16 NOTE — Progress Notes (Signed)
  Echocardiogram 2D Echocardiogram has been performed.  Tye Savoy 07/16/2015, 4:51 PM

## 2015-07-16 NOTE — Progress Notes (Signed)
PATIENT DETAILS Name: Hannah Lamb Age: 74 y.o. Sex: female Date of Birth: 10/19/1940 Admit Date: 07/15/2015 Admitting Physician Lorretta Harp, MD ZOX:[id number],VWUJW, MD  Subjective: No major complaints this morning.  Assessment/Plan: Principal Problem: Syncope: Etiology remains unknown-but likely either orthostatics or neurally mediated. Telemetry negative for arrhythmias. Check echo, if no major abnormalities home tomorrow morning. EEG pending-but per family and patient no seizure-like activity noted. Continue Keppra  Active Problems: History of seizures: Continue Keppra-follow clinical course-await EEG  Stage II decubitus ulcer: Await wound care evaluation  Bacterial vaginosis: Continue Flagyl-total treatment for 7 days  Essential hypertension: Controlled-Continue Cardizem and Avapro  Insulin-dependent Diabetes: CBGs moderately controlled-continue SSI, resume insulin 70/3015 units daily. Follow and adjust accordingly  Multiple sclerosis: Nonambulatory at baseline with urinary and bowel incontinence. Not in any disease modifying agents. Defer to outpatient neurology.  Disposition: Remain inpatient  Antimicrobial agents  See below  Anti-infectives    Start     Dose/Rate Route Frequency Ordered Stop   07/16/15 0800  metroNIDAZOLE (FLAGYL) IVPB 500 mg     500 mg 100 mL/hr over 60 Minutes Intravenous Every 12 hours 07/16/15 0716     07/15/15 2200  metroNIDAZOLE (FLAGYL) tablet 500 mg  Status:  Discontinued     500 mg Oral Every 12 hours 07/15/15 2017 07/16/15 0716      DVT Prophylaxis: Prophylactic Heparin   Code Status:  DNR  Family Communication Daughter at bedside  Procedures: nONE  CONSULTS:  None  Time spent 30 minutes-Greater than 50% of this time was spent in counseling, explanation of diagnosis, planning of further management, and coordination of care.  MEDICATIONS: Scheduled Meds: . diltiazem  60 mg Oral Daily  . heparin  5,000  Units Subcutaneous 3 times per day  . insulin aspart  0-9 Units Subcutaneous TID WC  . irbesartan  300 mg Oral Daily  . levETIRAcetam  250 mg Oral BID  . metronidazole  500 mg Intravenous Q12H  . sodium chloride  3 mL Intravenous Q12H   Continuous Infusions:  PRN Meds:.ondansetron **OR** ondansetron (ZOFRAN) IV    PHYSICAL EXAM: Vital signs in last 24 hours: Filed Vitals:   07/15/15 2218 07/16/15 0311 07/16/15 0551 07/16/15 0800  BP: 175/86 138/77  127/72  Pulse:    80  Temp:   98.3 F (36.8 C) 98.6 F (37 C)  TempSrc:   Oral Oral  Resp: 20   18  Height:      Weight:      SpO2: 100% 100%  100%    Weight change:  Filed Weights   07/15/15 2130  Weight: 60.238 kg (132 lb 12.8 oz)   Body mass index is 20.48 kg/(m^2).   Gen Exam: Awake and alert with clear speech.   Neck: Supple, No JVD.   Chest: B/L Clear.   CVS: S1 S2 Regular, no murmurs.  Abdomen: soft, BS +, non tender, non distended.  Extremities: no edema, lower extremities warm to touch. Neurologic: Lower extremities-3/5-chronic Skin: No Rash.   Wounds: N/A.    Intake/Output from previous day:  Intake/Output Summary (Last 24 hours) at 07/16/15 1237 Last data filed at 07/15/15 2300  Gross per 24 hour  Intake    480 ml  Output      0 ml  Net    480 ml     LAB RESULTS: CBC  Recent Labs Lab 07/15/15 1425 07/16/15 0504  WBC 7.8  6.1  HGB 13.5 11.8*  HCT 40.2 35.0*  PLT 182 176  MCV 84.5 85.2  MCH 28.4 28.7  MCHC 33.6 33.7  RDW 13.8 14.3  LYMPHSABS 1.8  --   MONOABS 0.6  --   EOSABS 0.1  --   BASOSABS 0.0  --     Chemistries   Recent Labs Lab 07/15/15 1425 07/16/15 0504  NA 142 141  K 3.1* 3.5  CL 102 106  CO2 29 27  GLUCOSE 191* 223*  BUN 12 18  CREATININE 0.56 0.67  CALCIUM 9.1 8.7*  MG  --  1.6*    CBG:  Recent Labs Lab 07/15/15 2215 07/16/15 0809 07/16/15 1143  GLUCAP 204* 208* 247*    GFR Estimated Creatinine Clearance: 57.7 mL/min (by C-G formula based on Cr of  0.67).  Coagulation profile  Recent Labs Lab 07/15/15 2039  INR 1.16    Cardiac Enzymes No results for input(s): CKMB, TROPONINI, MYOGLOBIN in the last 168 hours.  Invalid input(s): CK  Invalid input(s): POCBNP No results for input(s): DDIMER in the last 72 hours. No results for input(s): HGBA1C in the last 72 hours. No results for input(s): CHOL, HDL, LDLCALC, TRIG, CHOLHDL, LDLDIRECT in the last 72 hours. No results for input(s): TSH, T4TOTAL, T3FREE, THYROIDAB in the last 72 hours.  Invalid input(s): FREET3 No results for input(s): VITAMINB12, FOLATE, FERRITIN, TIBC, IRON, RETICCTPCT in the last 72 hours. No results for input(s): LIPASE, AMYLASE in the last 72 hours.  Urine Studies No results for input(s): UHGB, CRYS in the last 72 hours.  Invalid input(s): UACOL, UAPR, USPG, UPH, UTP, UGL, UKET, UBIL, UNIT, UROB, ULEU, UEPI, UWBC, URBC, UBAC, CAST, UCOM, BILUA  MICROBIOLOGY: Recent Results (from the past 240 hour(s))  Wet prep, genital     Status: Abnormal   Collection Time: 07/15/15  3:49 PM  Result Value Ref Range Status   Yeast Wet Prep HPF POC NONE SEEN NONE SEEN Final   Trich, Wet Prep NONE SEEN NONE SEEN Final   Clue Cells Wet Prep HPF POC FEW (A) NONE SEEN Final   WBC, Wet Prep HPF POC MANY (A) NONE SEEN Final    RADIOLOGY STUDIES/RESULTS: Dg Chest 2 View  07/15/2015  CLINICAL DATA:  Syncope, altered mental status EXAM: CHEST  2 VIEW COMPARISON:  11/18/2014 FINDINGS: Borderline cardiomegaly. Mild thoracic dextroscoliosis. There is aneurysmal dilatation of thoracic aorta measures 3.8 cm in diameter. No acute infiltrate or pulmonary edema. There is vague nodular density left perihilar measures 7.3 mm. Further correlation with CT scan of the chest is recommended. Degenerative changes and osteopenia thoracic spine. IMPRESSION: No acute infiltrate or pulmonary edema. Mild thoracic dextroscoliosis. Aneurysmal dilatation of thoracic aorta. Vague nodular density left  perihilar measures 7.3 mm. Further evaluation with CT scan of the chest is recommended. Electronically Signed   By: Natasha Mead M.D.   On: 07/15/2015 15:09    Jeoffrey Massed, MD  [facility] Pager:[phone]  If 7PM-7AM, please contact night-coverage [email or web address] Password [email or web address] 07/16/2015, 12:37 PM   LOS: 1 day

## 2015-07-16 NOTE — Consult Note (Addendum)
WOC wound consult note Reason for Consult: Consult requested for coccyx/buttocks.  Family states these wounds have been present for approx 2 weeks prior to admission. Wound type: There are 3 wounds; 2 on inner gluteal fold near coccyx, and one to lower buttocks.   Pressure Ulcer POA: Yes; these were present on admission; these are NOT pressure ulcers, they are partial thickness wounds related to moisture associated skin damage.   Measurement: Inner gluteal folds near coccyx with 2 areas, each is a fissure approx 2X.1X.1cm, moist red wound bed, no odor, small amt yellow drainage.  Lower buttock with partial thickness wound, .2X.2X.1cm, pink and moist.  This is not located over a bony prominence.  Pt is frequently incontinent of urine, and current foam dressing was soaked.  It will be difficult to promote healing related to constant moisture. Dressing procedure/placement/frequency: Leave dressings off, since they are trapping moisture against skin.  Barrier cream to repel moisture and promote healing. Position off affected area as often as possible. Discussed plan of care with patient and family member at bedside, they verbalize understanding. Please re-consult if further assistance is needed.  Thank-you,  Cammie Mcgee MSN, RN, CWOCN, [place], CNS [phone]

## 2015-07-16 NOTE — Progress Notes (Signed)
EEG Completed; Results Pending  

## 2015-07-16 NOTE — Progress Notes (Signed)
PT Cancellation Note  Patient Details Name: ALAZNE QUANT MRN: [id number] DOB: [birth year]   Cancelled Treatment:    Reason Eval/Treat Not Completed: Patient at procedure or test/unavailable.  Will follow.    Ralene Bathe Kistler 07/16/2015, 10:03 AM [phone]

## 2015-07-16 NOTE — Progress Notes (Signed)
UR Completed Delano Frate Graves-Bigelow, RN,BSN [phone]  

## 2015-07-17 ENCOUNTER — Inpatient Hospital Stay (HOSPITAL_BASED_OUTPATIENT_CLINIC_OR_DEPARTMENT_OTHER): Payer: Medicare Other

## 2015-07-17 DIAGNOSIS — G35 Multiple sclerosis: Secondary | ICD-10-CM

## 2015-07-17 DIAGNOSIS — R609 Edema, unspecified: Secondary | ICD-10-CM

## 2015-07-17 DIAGNOSIS — I10 Essential (primary) hypertension: Secondary | ICD-10-CM

## 2015-07-17 DIAGNOSIS — R55 Syncope and collapse: Secondary | ICD-10-CM | POA: Diagnosis not present

## 2015-07-17 DIAGNOSIS — E1140 Type 2 diabetes mellitus with diabetic neuropathy, unspecified: Secondary | ICD-10-CM | POA: Diagnosis not present

## 2015-07-17 LAB — GLUCOSE, CAPILLARY
Glucose-Capillary: 239 mg/dL — ABNORMAL HIGH (ref 65–99)
Glucose-Capillary: 284 mg/dL — ABNORMAL HIGH (ref 65–99)

## 2015-07-17 MED ORDER — RIVAROXABAN (XARELTO) VTE STARTER PACK (15 & 20 MG): 15.0000 mg | ORAL_TABLET | Freq: Two times a day (BID) | ORAL | Status: DC

## 2015-07-17 MED ORDER — RIVAROXABAN (XARELTO) VTE STARTER PACK (15 & 20 MG)
ORAL_TABLET | ORAL | Status: DC
Start: 2015-07-17 — End: 2017-09-06

## 2015-07-17 MED ORDER — RIVAROXABAN 15 MG PO TABS
15.0000 mg | ORAL_TABLET | Freq: Once | ORAL | Status: AC
  Administered 2015-07-17: 15 mg via ORAL
  Filled 2015-07-17: qty 1

## 2015-07-17 MED ORDER — OXYCODONE HCL 5 MG PO TABS: 5.0000 mg | ORAL_TABLET | Freq: Four times a day (QID) | ORAL | Status: DC | PRN

## 2015-07-17 MED ORDER — RIVAROXABAN 20 MG PO TABS: 20.0000 mg | ORAL_TABLET | Freq: Every day | ORAL | Status: DC

## 2015-07-17 MED ORDER — METRONIDAZOLE 500 MG PO TABS: 500.0000 mg | ORAL_TABLET | Freq: Two times a day (BID) | ORAL | Status: DC

## 2015-07-17 NOTE — Care Management Important Message (Signed)
Important Message  Patient Details  Name: Hannah Lamb MRN: [id number] Date of Birth: 02-20-1941   Medicare Important Message Given:  Yes-second notification given    Kyla Balzarine 07/17/2015, 10:54 AM

## 2015-07-17 NOTE — Progress Notes (Signed)
*  Preliminary Results* Bilateral lower extremity venous duplex completed. The right lower extremity is positive for deep vein thrombosis involving the right peroneal veins. There is no evidence of left lower extremity deep vein thrombosis or bilateral Baker's cyst.  Preliminary results discussed with Shanda Bumps, RN.  07/17/2015  Gertie Fey, RVT, RDCS, RDMS

## 2015-07-17 NOTE — Discharge Summary (Addendum)
PATIENT DETAILS Name: Hannah Lamb Age: 74 y.o. Sex: female Date of Birth: 1941/06/13 MRN: [id number]. Admitting Physician: Lorretta Harp, MD WUJ:WJXBJ,YNWGN, MD  Admit Date: 07/15/2015 Discharge date: 07/17/2015  Recommendations for Outpatient Follow-up:  1. EEG pending-please follow  2. Please repeat CBC/BMET at next visit 3. Consider referral to cardiology for a loop recorder-given prior hx of syncope 4. Consider Hematology referral prior to discontinuing Xarelto  PRIMARY DISCHARGE DIAGNOSIS:  Principal Problem:   Syncope Active Problems:   HYPERCHOLESTEROLEMIA   Multiple sclerosis (HCC)   History of CVA (cerebrovascular accident)   Hypertension   Diabetes mellitus type 2, controlled (HCC)   Hypokalemia   Seizure (HCC)   Decubitus ulcer of buttock, stage 2   Vaginal discharge      PAST MEDICAL HISTORY: Past Medical History  Diagnosis Date  . Hypertension   . MS (multiple sclerosis) (HCC)   . Diabetes mellitus   . Goiter   . Hyperlipidemia   . Myocardial infarction (HCC)   . Anxiety   . Stroke (HCC)   . Syncope     recurrent  . Bilateral chronic knee pain   . Muscular dystrophy (HCC)     DISCHARGE MEDICATIONS: Current Discharge Medication List    START taking these medications   Details  metroNIDAZOLE (FLAGYL) 500 MG tablet Take 1 tablet (500 mg total) by mouth 2 (two) times daily. Qty: 10 tablet, Refills: 0    oxyCODONE (ROXICODONE) 5 MG immediate release tablet Take 1 tablet (5 mg total) by mouth every 6 (six) hours as needed for moderate pain. Qty: 30 tablet, Refills: 0    Rivaroxaban (XARELTO STARTER PACK) 15 & 20 MG TBPK 15 mg, Oral, 2 times daily with meals, till 08/07/15, then, 20 mg oral once daily from 08/08/15 Qty: 51 each, Refills: 0    rivaroxaban (XARELTO) 20 MG TABS tablet Take 1 tablet (20 mg total) by mouth daily. START ON 08/08/15 Qty: 60 tablet, Refills: 0      CONTINUE these medications which have NOT CHANGED   Details    diltiazem (CARDIZEM SR) 60 MG 12 hr capsule Take 1 capsule (60 mg total) by mouth daily. Qty: 30 capsule, Refills: 2    HUMULIN 70/30 (70-30) 100 UNIT/ML injection Inject 15 Units into the skin daily.    irbesartan (AVAPRO) 300 MG tablet Take 1 tablet (300 mg total) by mouth daily. Qty: 30 tablet, Refills: 0    levETIRAcetam (KEPPRA) 250 MG tablet Take 1 tablet (250 mg total) by mouth 2 (two) times daily. Qty: 60 tablet, Refills: 2    potassium chloride (K-DUR) 10 MEQ tablet Take 10 mEq by mouth daily.    B-D INS SYRINGE 0.5CC/30GX1/2" 30G X 1/2" 0.5 ML MISC       STOP taking these medications     traMADol (ULTRAM) 50 MG tablet         ALLERGIES:   Allergies  Allergen Reactions  . Sulfa Drugs Cross Reactors Other (See Comments)    dizziness  . Lipitor [Atorvastatin] Other (See Comments)    Muscle weakness    BRIEF HPI:  See H&P, Labs, Consult and Test reports for all details in brief, patient was admitted for evaluation of a syncopal episode  CONSULTATIONS:   None  PERTINENT RADIOLOGIC STUDIES: Dg Chest 2 View  07/15/2015  CLINICAL DATA:  Syncope, altered mental status EXAM: CHEST  2 VIEW COMPARISON:  11/18/2014 FINDINGS: Borderline cardiomegaly. Mild thoracic dextroscoliosis. There is aneurysmal dilatation of thoracic aorta measures 3.8  cm in diameter. No acute infiltrate or pulmonary edema. There is vague nodular density left perihilar measures 7.3 mm. Further correlation with CT scan of the chest is recommended. Degenerative changes and osteopenia thoracic spine. IMPRESSION: No acute infiltrate or pulmonary edema. Mild thoracic dextroscoliosis. Aneurysmal dilatation of thoracic aorta. Vague nodular density left perihilar measures 7.3 mm. Further evaluation with CT scan of the chest is recommended. Electronically Signed   By: Natasha Mead M.D.   On: 07/15/2015 15:09     PERTINENT LAB RESULTS: CBC:  Recent Labs  07/15/15 1425 07/16/15 0504  WBC 7.8 6.1  HGB 13.5  11.8*  HCT 40.2 35.0*  PLT 182 176   CMET CMP     Component Value Date/Time   NA 141 07/16/2015 0504   K 3.5 07/16/2015 0504   CL 106 07/16/2015 0504   CO2 27 07/16/2015 0504   GLUCOSE 223* 07/16/2015 0504   BUN 18 07/16/2015 0504   CREATININE 0.67 07/16/2015 0504   CALCIUM 8.7* 07/16/2015 0504   PROT 5.7* 07/16/2015 0504   ALBUMIN 2.4* 07/16/2015 0504   AST 16 07/16/2015 0504   ALT 11* 07/16/2015 0504   ALKPHOS 49 07/16/2015 0504   BILITOT 0.5 07/16/2015 0504   GFRNONAA >60 07/16/2015 0504   GFRAA >60 07/16/2015 0504    GFR Estimated Creatinine Clearance: 60.2 mL/min (by C-G formula based on Cr of 0.67). No results for input(s): LIPASE, AMYLASE in the last 72 hours. No results for input(s): CKTOTAL, CKMB, CKMBINDEX, TROPONINI in the last 72 hours. Invalid input(s): POCBNP No results for input(s): DDIMER in the last 72 hours. No results for input(s): HGBA1C in the last 72 hours. No results for input(s): CHOL, HDL, LDLCALC, TRIG, CHOLHDL, LDLDIRECT in the last 72 hours. No results for input(s): TSH, T4TOTAL, T3FREE, THYROIDAB in the last 72 hours.  Invalid input(s): FREET3 No results for input(s): VITAMINB12, FOLATE, FERRITIN, TIBC, IRON, RETICCTPCT in the last 72 hours. Coags:  Recent Labs  07/15/15 2039  INR 1.16   Microbiology: Recent Results (from the past 240 hour(s))  Wet prep, genital     Status: Abnormal   Collection Time: 07/15/15  3:49 PM  Result Value Ref Range Status   Yeast Wet Prep HPF POC NONE SEEN NONE SEEN Final   Trich, Wet Prep NONE SEEN NONE SEEN Final   Clue Cells Wet Prep HPF POC FEW (A) NONE SEEN Final   WBC, Wet Prep HPF POC MANY (A) NONE SEEN Final     BRIEF HOSPITAL COURSE:  Syncope: Etiology remains unknown-but likely either orthostatics or neurally mediated. Given DVT-could have had a small PE-but no RV strain or Hypoxia evident on exam. Telemetry negative for arrhythmias.Echo shows preserved EF, Lower ext Doppler positive for  DVT.EEG pending-but per family and patient no seizure-like activity noted. Continue Keppra. Suggest outpatient cards eval for consideration of a loop recorder.  Active Problems: Right lower extremity DVT:non ambulatory-After speaking with patient/daughter at bedside-options of numerous anticoagulants, side effects of anticoagulation was discussed. Subsequently started Xarelto-follow with PCP-consider Hematology referral prior to discontinuing Xarelto  History of seizures: Continue Keppra-follow clinical course-EEG pending-please follow  Stage II decubitus ulcer: seen by wound RN-recommendations ZOX:WRUEAVW cream to repel moisture and promote healing. Position off affected area as often as possible.Will ask CM to set up [facility]  History of seizures: Continue Keppra-follow clinical course-EEG pending-please follow  Bacterial vaginosis: Continue Flagyl-total treatment for 7 days  Essential hypertension: Controlled-Continue Cardizem and Avapro  Insulin-dependent Diabetes: CBGs moderately controlled-continue SSI, resume  insulin 70/3015 units daily. Follow and adjust accordingly in the outpatient setting  Multiple sclerosis: Nonambulatory at baseline with urinary and bowel incontinence. Not in any disease modifying agents. Defer to outpatient neurology.  Bacterial vaginosis: Continue Flagyl-total treatment for 7 days  Essential hypertension: Controlled-Continue Cardizem and Avapro  Insulin-dependent Diabetes: CBGs moderately controlled-continue SSI, resume insulin 70/3015 units daily. Follow and adjust accordingly  Multiple sclerosis: Nonambulatory at baseline with urinary and bowel incontinence. Not in any disease modifying agents. Defer to outpatient neurology.   TODAY-DAY OF DISCHARGE:  Subjective:   Nyonna Stanfill today has no headache,no chest abdominal pain,no new weakness tingling or numbness, feels much better wants to go home today.   Objective:   Blood pressure 168/75, pulse 89,  temperature 99.4 F (37.4 C), temperature source Oral, resp. rate 15, height 5' 7.5" (1.715 m), weight 64.411 kg (142 lb), SpO2 97 %.  Intake/Output Summary (Last 24 hours) at 07/17/15 1548 Last data filed at 07/17/15 1300  Gross per 24 hour  Intake    943 ml  Output      1 ml  Net    942 ml   Filed Weights   07/15/15 2130 07/17/15 0338  Weight: 60.238 kg (132 lb 12.8 oz) 64.411 kg (142 lb)    Exam Awake Alert, Oriented *3, No new F.N deficits, Normal affect [place].AT,PERRAL Supple Neck,No JVD, No cervical lymphadenopathy appriciated.  Symmetrical Chest wall movement, Good air movement bilaterally, CTAB RRR,No Gallops,Rubs or new Murmurs, No Parasternal Heave +ve B.Sounds, Abd Soft, Non tender, No organomegaly appriciated, No rebound -guarding or rigidity. No Cyanosis, Clubbing or edema, No new Rash or bruise  DISCHARGE CONDITION: Stable  DISPOSITION: Home with home health services  DISCHARGE INSTRUCTIONS:    Activity:  As tolerated with Full fall precautions use walker/cane & assistance as needed  Get Medicines reviewed and adjusted: Please take all your medications with you for your next visit with your Primary MD  Please request your Primary MD to go over all hospital tests and procedure/radiological results at the follow up, please ask your Primary MD to get all Hospital records sent to his/her office.  If you experience worsening of your admission symptoms, develop shortness of breath, life threatening emergency, suicidal or homicidal thoughts you must seek medical attention immediately by calling 911 or calling your MD immediately  if symptoms less severe.  You must read complete instructions/literature along with all the possible adverse reactions/side effects for all the Medicines you take and that have been prescribed to you. Take any new Medicines after you have completely understood and accpet all the possible adverse reactions/side effects.   Do not drive when  taking Pain medications.   Do not take more than prescribed Pain, Sleep and Anxiety Medications  Special Instructions: If you have smoked or chewed Tobacco  in the last 2 yrs please stop smoking, stop any regular Alcohol  and or any Recreational drug use.  Wear Seat belts while driving.  Please note  You were cared for by a hospitalist during your hospital stay. Once you are discharged, your primary care physician will handle any further medical issues. Please note that NO REFILLS for any discharge medications will be authorized once you are discharged, as it is imperative that you return to your primary care physician (or establish a relationship with a primary care physician if you do not have one) for your aftercare needs so that they can reassess your need for medications and monitor your lab values.  Diet recommendation: Diabetic Diet Heart Healthy diet  Discharge Instructions    Call MD for:  extreme fatigue    Complete by:  As directed      Call MD for:  persistant dizziness or light-headedness    Complete by:  As directed      Diet - low sodium heart healthy    Complete by:  As directed      Diet Carb Modified    Complete by:  As directed      Increase activity slowly    Complete by:  As directed            Follow-up Information    Follow up with Lonia Blood, MD. Go on 07/27/2015.   Specialty:  Internal Medicine   Why:  Hospital follow up    Contact information:   [address]  [place] [address] [phone]       Follow up with [facility].   Specialty:  Home Health Services   Why:  Registered Nurse and Aide   Contact information:   [address] [place] [address] [phone]       Follow up with [facility].   Why:  Xarelto starter pack can be picked up @ this location with the 30 day free card.    Contact information:   Located [facility] [address] [place]  [place], [place] [address]  Phone: [phone]      Total Time spent on discharge equals  45 minutes.  SignedJeoffrey Massed 07/17/2015 3:48 PM

## 2015-07-17 NOTE — Discharge Instructions (Signed)
Information on my medicine - XARELTO (rivaroxaban)  This medication education was reviewed with me or my healthcare representative as part of my discharge preparation.  The pharmacist that spoke with me during my hospital stay was:  Chinita Greenland, [facility]  WHY WAS Hannah Lamb PRESCRIBED FOR YOU? Xarelto was prescribed to treat blood clots that may have been found in the veins of your legs (deep vein thrombosis) or in your lungs (pulmonary embolism) and to reduce the risk of them occurring again.  What do you need to know about Xarelto? The starting dose is one 15 mg tablet taken TWICE daily with food for the FIRST 21 DAYS then on 11/26  the dose is changed to one 20 mg tablet taken ONCE A DAY with your evening meal.  DO NOT stop taking Xarelto without talking to the health care provider who prescribed the medication.  Refill your prescription for 20 mg tablets before you run out.  After discharge, you should have regular check-up appointments with your healthcare provider that is prescribing your Xarelto.  In the future your dose may need to be changed if your kidney function changes by a significant amount.  What do you do if you miss a dose? If you are taking Xarelto TWICE DAILY and you miss a dose, take it as soon as you remember. You may take two 15 mg tablets (total 30 mg) at the same time then resume your regularly scheduled 15 mg twice daily the next day.  If you are taking Xarelto ONCE DAILY and you miss a dose, take it as soon as you remember on the same day then continue your regularly scheduled once daily regimen the next day. Do not take two doses of Xarelto at the same time.   Important Safety Information Xarelto is a blood thinner medicine that can cause bleeding. You should call your healthcare provider right away if you experience any of the following: ? Bleeding from an injury or your nose that does not stop. ? Unusual colored urine (red or dark brown) or unusual  colored stools (red or black). ? Unusual bruising for unknown reasons. ? A serious fall or if you hit your head (even if there is no bleeding).  Some medicines may interact with Xarelto and might increase your risk of bleeding while on Xarelto. To help avoid this, consult your healthcare provider or pharmacist prior to using any new prescription or non-prescription medications, including herbals, vitamins, non-steroidal anti-inflammatory drugs (NSAIDs) and supplements.  This website has more information on Xarelto: VisitDestination.com.br.

## 2015-07-17 NOTE — Care Management Obs Status (Signed)
MEDICARE OBSERVATION STATUS NOTIFICATION   Patient Details  Name: Hannah Lamb MRN: [id number] Date of Birth: 1941-08-13   Medicare Observation Status Notification Given:  Yes    Gala Lewandowsky, RN 07/17/2015, 11:22 AM

## 2015-07-17 NOTE — Progress Notes (Signed)
Discharge teaching and instructions reviewed with pt. Awaiting for daughter to arrive around 4pm

## 2015-07-17 NOTE — Care Management Note (Signed)
Case Management Note  Patient Details  Name: Hannah Lamb MRN: [id number] Date of Birth: March 15, 1941  Subjective/Objective:    Pt admitted for Syncope found to be positive for DVT. Initiated on Xarelto. CM did speak with family in regards to medication. CM did make pt aware to go to the [facility] to pick up starter pack for Xarelto. Co pay will be $3.70. Pt uses [facility] on [address] and medication is available.   Action/Plan: CM spoke with daughter and pt and both are agreeable for [facility] via [facility] for RN/ [employer]. CM did make referral and SOC to begin within 24-48 hours post d/c. No further needs from CM at this time.   Expected Discharge Date:                  Expected Discharge Plan:  Home w Home Health Services  In-House Referral:  NA  Discharge planning Services  CM Consult  Post Acute Care Choice:  Home Health Choice offered to:  Adult Children  DME Arranged:  N/A DME Agency:  NA  HH Arranged:  RN, Nurse's Aide HH Agency:  [facility]  Status of Service:  Completed, signed off  Medicare Important Message Given:  Yes-second notification given Date Medicare IM Given:    Medicare IM give by:    Date Additional Medicare IM Given:    Additional Medicare Important Message give by:     If discussed at Long Length of Stay Meetings, dates discussed:    Additional Comments:  Gala Lewandowsky, RN 07/17/2015, 12:46 PM

## 2015-07-19 LAB — LEVETIRACETAM LEVEL: LEVETIRACETAM: NOT DETECTED ug/mL (ref 10.0–40.0)

## 2015-07-22 NOTE — Progress Notes (Addendum)
   07/16/15 1200  OT G-codes **NOT FOR INPATIENT CLASS**  Functional Assessment Tool Used clinical judgement  Functional Limitation Self care  Self Care Current Status (V4944) CM  Self Care Goal Status (H6759) CM  Self Care Discharge Status (F6384) CM  G Codes entered. [name], [name] [phone]

## 2017-09-03 ENCOUNTER — Emergency Department (HOSPITAL_COMMUNITY): Payer: Medicare Other

## 2017-09-03 ENCOUNTER — Inpatient Hospital Stay (HOSPITAL_COMMUNITY)
Admission: EM | Admit: 2017-09-03 | Discharge: 2017-09-06 | DRG: 541 | Disposition: A | Discharge status: Home Health | Payer: Medicare Other | Attending: Internal Medicine | Admitting: Internal Medicine

## 2017-09-03 ENCOUNTER — Other Ambulatory Visit: Payer: Self-pay

## 2017-09-03 ENCOUNTER — Encounter (HOSPITAL_COMMUNITY): Payer: Self-pay | Admitting: Emergency Medicine

## 2017-09-03 DIAGNOSIS — M869 Osteomyelitis, unspecified: Secondary | ICD-10-CM | POA: Diagnosis not present

## 2017-09-03 DIAGNOSIS — M86071 Acute hematogenous osteomyelitis, right ankle and foot: Secondary | ICD-10-CM | POA: Diagnosis not present

## 2017-09-03 DIAGNOSIS — G35 Multiple sclerosis: Secondary | ICD-10-CM | POA: Diagnosis not present

## 2017-09-03 DIAGNOSIS — L89620 Pressure ulcer of left heel, unstageable: Secondary | ICD-10-CM | POA: Diagnosis present

## 2017-09-03 DIAGNOSIS — L97509 Non-pressure chronic ulcer of other part of unspecified foot with unspecified severity: Secondary | ICD-10-CM

## 2017-09-03 DIAGNOSIS — E11649 Type 2 diabetes mellitus with hypoglycemia without coma: Secondary | ICD-10-CM | POA: Diagnosis present

## 2017-09-03 DIAGNOSIS — E785 Hyperlipidemia, unspecified: Secondary | ICD-10-CM | POA: Diagnosis present

## 2017-09-03 DIAGNOSIS — Z90710 Acquired absence of both cervix and uterus: Secondary | ICD-10-CM | POA: Diagnosis not present

## 2017-09-03 DIAGNOSIS — L89610 Pressure ulcer of right heel, unstageable: Secondary | ICD-10-CM | POA: Diagnosis present

## 2017-09-03 DIAGNOSIS — L89890 Pressure ulcer of other site, unstageable: Secondary | ICD-10-CM | POA: Diagnosis present

## 2017-09-03 DIAGNOSIS — Z7401 Bed confinement status: Secondary | ICD-10-CM | POA: Diagnosis not present

## 2017-09-03 DIAGNOSIS — L8990 Pressure ulcer of unspecified site, unspecified stage: Secondary | ICD-10-CM

## 2017-09-03 DIAGNOSIS — R569 Unspecified convulsions: Secondary | ICD-10-CM | POA: Diagnosis present

## 2017-09-03 DIAGNOSIS — G7100 Muscular dystrophy, unspecified: Secondary | ICD-10-CM | POA: Diagnosis present

## 2017-09-03 DIAGNOSIS — Z794 Long term (current) use of insulin: Secondary | ICD-10-CM

## 2017-09-03 DIAGNOSIS — E049 Nontoxic goiter, unspecified: Secondary | ICD-10-CM | POA: Diagnosis present

## 2017-09-03 DIAGNOSIS — I10 Essential (primary) hypertension: Secondary | ICD-10-CM | POA: Diagnosis present

## 2017-09-03 DIAGNOSIS — G8929 Other chronic pain: Secondary | ICD-10-CM | POA: Diagnosis present

## 2017-09-03 DIAGNOSIS — I252 Old myocardial infarction: Secondary | ICD-10-CM

## 2017-09-03 DIAGNOSIS — E1122 Type 2 diabetes mellitus with diabetic chronic kidney disease: Secondary | ICD-10-CM | POA: Diagnosis not present

## 2017-09-03 DIAGNOSIS — Z888 Allergy status to other drugs, medicaments and biological substances status: Secondary | ICD-10-CM | POA: Diagnosis not present

## 2017-09-03 DIAGNOSIS — E876 Hypokalemia: Secondary | ICD-10-CM | POA: Diagnosis present

## 2017-09-03 DIAGNOSIS — Z66 Do not resuscitate: Secondary | ICD-10-CM | POA: Diagnosis present

## 2017-09-03 DIAGNOSIS — Z882 Allergy status to sulfonamides status: Secondary | ICD-10-CM

## 2017-09-03 DIAGNOSIS — Z8673 Personal history of transient ischemic attack (TIA), and cerebral infarction without residual deficits: Secondary | ICD-10-CM | POA: Diagnosis not present

## 2017-09-03 DIAGNOSIS — E1169 Type 2 diabetes mellitus with other specified complication: Secondary | ICD-10-CM | POA: Diagnosis present

## 2017-09-03 DIAGNOSIS — Z79899 Other long term (current) drug therapy: Secondary | ICD-10-CM

## 2017-09-03 DIAGNOSIS — E079 Disorder of thyroid, unspecified: Secondary | ICD-10-CM | POA: Diagnosis present

## 2017-09-03 DIAGNOSIS — Z833 Family history of diabetes mellitus: Secondary | ICD-10-CM

## 2017-09-03 DIAGNOSIS — N183 Chronic kidney disease, stage 3 (moderate): Secondary | ICD-10-CM

## 2017-09-03 DIAGNOSIS — L97929 Non-pressure chronic ulcer of unspecified part of left lower leg with unspecified severity: Secondary | ICD-10-CM | POA: Diagnosis present

## 2017-09-03 LAB — GLUCOSE, CAPILLARY
Glucose-Capillary: 49 mg/dL — ABNORMAL LOW (ref 65–99)
Glucose-Capillary: 117 mg/dL — ABNORMAL HIGH (ref 65–99)

## 2017-09-03 LAB — CBC WITH DIFFERENTIAL/PLATELET
Basophils Absolute: 0 10*3/uL (ref 0.0–0.1)
Basophils Relative: 0 %
Eosinophils Absolute: 0.1 10*3/uL (ref 0.0–0.7)
Eosinophils Relative: 1 %
HCT: 33.8 % — ABNORMAL LOW (ref 36.0–46.0)
Hemoglobin: 10.9 g/dL — ABNORMAL LOW (ref 12.0–15.0)
Lymphocytes Relative: 18 %
Lymphs Abs: 1.4 10*3/uL (ref 0.7–4.0)
MCH: 26.5 pg (ref 26.0–34.0)
MCHC: 32.2 g/dL (ref 30.0–36.0)
MCV: 82.2 fL (ref 78.0–100.0)
Monocytes Absolute: 0.6 10*3/uL (ref 0.1–1.0)
Monocytes Relative: 8 %
Neutro Abs: 5.3 10*3/uL (ref 1.7–7.7)
Neutrophils Relative %: 73 %
Platelets: 393 10*3/uL (ref 150–400)
RBC: 4.11 MIL/uL (ref 3.87–5.11)
RDW: 16.1 % — ABNORMAL HIGH (ref 11.5–15.5)
WBC: 7.4 10*3/uL (ref 4.0–10.5)

## 2017-09-03 LAB — BASIC METABOLIC PANEL
Anion gap: 11 (ref 5–15)
BUN: 10 mg/dL (ref 6–20)
CO2: 25 mmol/L (ref 22–32)
Calcium: 8.7 mg/dL — ABNORMAL LOW (ref 8.9–10.3)
Chloride: 98 mmol/L — ABNORMAL LOW (ref 101–111)
Creatinine, Ser: 0.38 mg/dL — ABNORMAL LOW (ref 0.44–1.00)
GFR calc Af Amer: >60 mL/min (ref >60)
GFR calc non Af Amer: >60 mL/min (ref >60)
Glucose, Bld: 66 mg/dL (ref 65–99)
Potassium: 5 mmol/L (ref 3.5–5.1)
Sodium: 134 mmol/L — ABNORMAL LOW (ref 135–145)

## 2017-09-03 LAB — CBC
HCT: 32.2 % — ABNORMAL LOW (ref 36.0–46.0)
Hemoglobin: 10.7 g/dL — ABNORMAL LOW (ref 12.0–15.0)
MCH: 27 pg (ref 26.0–34.0)
MCHC: 33.2 g/dL (ref 30.0–36.0)
MCV: 81.1 fL (ref 78.0–100.0)
PLATELETS: 379 10*3/uL (ref 150–400)
RBC: 3.97 MIL/uL (ref 3.87–5.11)
RDW: 15.7 % — AB (ref 11.5–15.5)
WBC: 7.4 10*3/uL (ref 4.0–10.5)

## 2017-09-03 LAB — CREATININE, SERUM
Creatinine, Ser: 0.37 mg/dL — ABNORMAL LOW (ref 0.44–1.00)
GFR calc Af Amer: >60 mL/min (ref >60)

## 2017-09-03 LAB — SEDIMENTATION RATE: SED RATE: 95 mm/h — AB (ref 0–22)

## 2017-09-03 LAB — I-STAT CG4 LACTIC ACID, ED
Lactic Acid, Venous: 1.97 mmol/L — ABNORMAL HIGH (ref 0.5–1.9)
Lactic Acid, Venous: 1.1 mmol/L (ref 0.5–1.9)

## 2017-09-03 MED ORDER — VANCOMYCIN HCL IN DEXTROSE 1-5 GM/200ML-% IV SOLN
1000.0000 mg | Freq: Once | INTRAVENOUS | Status: AC
  Administered 2017-09-03: 1000 mg via INTRAVENOUS
  Filled 2017-09-03: qty 200

## 2017-09-03 MED ORDER — PIPERACILLIN-TAZOBACTAM 3.375 G IVPB 30 MIN
3.3750 g | INTRAVENOUS | Status: AC
  Administered 2017-09-03: 3.375 g via INTRAVENOUS
  Filled 2017-09-03: qty 50

## 2017-09-03 MED ORDER — HEPARIN SODIUM (PORCINE) 5000 UNIT/ML IJ SOLN
5000.0000 [IU] | Freq: Three times a day (TID) | INTRAMUSCULAR | Status: DC
  Administered 2017-09-03 – 2017-09-06 (×3): 5000 [IU] via SUBCUTANEOUS
  Filled 2017-09-03 (×7): qty 1

## 2017-09-03 MED ORDER — DILTIAZEM HCL ER 60 MG PO CP12
60.0000 mg | ORAL_CAPSULE | Freq: Every day | ORAL | Status: DC
  Filled 2017-09-03: qty 1

## 2017-09-03 MED ORDER — IRBESARTAN 300 MG PO TABS
300.0000 mg | ORAL_TABLET | Freq: Every day | ORAL | Status: DC
  Administered 2017-09-04 – 2017-09-06 (×3): 300 mg via ORAL
  Filled 2017-09-03 (×3): qty 1

## 2017-09-03 MED ORDER — PIPERACILLIN-TAZOBACTAM 3.375 G IVPB 30 MIN
3.3750 g | Freq: Once | INTRAVENOUS | Status: DC
  Filled 2017-09-03: qty 50

## 2017-09-03 MED ORDER — VANCOMYCIN HCL IN DEXTROSE 1-5 GM/200ML-% IV SOLN: 1000.0000 mg | Freq: Once | INTRAVENOUS | Status: DC

## 2017-09-03 MED ORDER — MORPHINE SULFATE (PF) 2 MG/ML IV SOLN
2.0000 mg | Freq: Once | INTRAVENOUS | Status: DC
  Filled 2017-09-03: qty 1

## 2017-09-03 MED ORDER — MORPHINE SULFATE (PF) 2 MG/ML IV SOLN
2.0000 mg | Freq: Once | INTRAVENOUS | Status: AC
  Administered 2017-09-03: 2 mg via INTRAVENOUS
  Filled 2017-09-03: qty 1

## 2017-09-03 NOTE — ED Notes (Signed)
ED TO INPATIENT HANDOFF REPORT  Name/Age/Gender Hannah Lamb 76 y.o. female  Code Status Code Status History    Date Active Date Inactive Code Status Order ID Comments User Context   07/15/2015 20:17 07/17/2015 20:00 DNR [id number]  Ivor Costa, MD ED   11/18/2014 20:17 11/20/2014 18:52 Full Code [id number]  Deneise Lever, MD ED   10/21/2014 00:29 10/25/2014 21:07 Full Code [id number]  Rise Patience, MD Inpatient   01/06/2013 16:54 01/08/2013 20:07 Full Code [id number]  Louellen Molder, MD Inpatient    Questions for Most Recent Historical Code Status (Order [id number])    Question Answer Comment   In the event of cardiac or respiratory ARREST Do not call a "code blue"    In the event of cardiac or respiratory ARREST Do not perform Intubation, CPR, defibrillation or ACLS    In the event of cardiac or respiratory ARREST Use medication by any route, position, wound care, and other measures to relive pain and suffering. May use oxygen, suction and manual treatment of airway obstruction as needed for comfort.       Home/SNF/Other Home  Chief Complaint ulcer on heel, diabetes  Level of Care/Admitting Diagnosis ED Disposition    ED Disposition Condition [facility] Area: [place] [[id number]]  Level of Care: Med-Surg [16]  Diagnosis: Osteomyelitis of ankle [facility]) [[id number]]  Admitting Physician: Desiree Hane [[id number]]  Attending Physician: Desiree Hane [phone]  Estimated length of stay: past midnight tomorrow  Certification:: I certify this patient will need inpatient services for at least 2 midnights  PT Class (Do Not Modify): Inpatient [101]  PT Acc Code (Do Not Modify): Private [1]       Medical History Past Medical History:  Diagnosis Date  . Anxiety   . Bilateral chronic knee pain   . Diabetes mellitus   . Goiter   . Hyperlipidemia   . Hypertension   . MS (multiple sclerosis) ([place])   . Muscular dystrophy   . Myocardial infarction  ([place])   . Stroke ([place])   . Syncope    recurrent    Allergies Allergies  Allergen Reactions  . Sulfa Drugs Cross Reactors Other (See Comments)    dizziness  . Lipitor [Atorvastatin] Other (See Comments)    Muscle weakness    IV Location/Drains/Wounds Patient Lines/Drains/Airways Status   Active Line/Drains/Airways    Name:   Placement date:   Placement time:   Site:   Days:   Incision - 4 Ports Abdomen 1: Umbilicus 2: Mid;Upper 3: Right;Medial 4: Right;Lateral   10/23/14    -     1046   Pressure Ulcer 07/15/15 Stage II -  Partial thickness loss of dermis presenting as a shallow open ulcer with a red, pink wound bed without slough. this is a partail thickness fissure R/T moisture associated skin damage   07/15/15    2300     781   Pressure Ulcer 07/15/15 Stage II -  Partial thickness loss of dermis presenting as a shallow open ulcer with a red, pink wound bed without slough. his is a partail thickness fissure R/T moisture associated skin damage   07/15/15    2300     781   Pressure Ulcer 07/15/15 Stage II -  Partial thickness loss of dermis presenting as a shallow open ulcer with a red, pink wound bed without slough. his is a partail thickness fissure R/T moisture associated skin damage   07/15/15  2300     781          Labs/Imaging Results for orders placed or performed during the hospital encounter of 09/03/17 (from the past 48 hour(s))  Basic metabolic panel     Status: Abnormal   Collection Time: 09/03/17  1:27 PM  Result Value Ref Range   Sodium 134 (L) 135 - 145 mmol/L   Potassium 5.0 3.5 - 5.1 mmol/L   Chloride 98 (L) 101 - 111 mmol/L   CO2 25 22 - 32 mmol/L   Glucose, Bld 66 65 - 99 mg/dL   BUN 10 6 - 20 mg/dL   Creatinine, Ser 0.38 (L) 0.44 - 1.00 mg/dL   Calcium 8.7 (L) 8.9 - 10.3 mg/dL   GFR calc non Af Amer >60 >60 mL/min   GFR calc Af Amer >60 >60 mL/min    Comment: (NOTE) The eGFR has been calculated using the CKD EPI equation. This calculation has not been  validated in all clinical situations. eGFR's persistently <60 mL/min signify possible Chronic Kidney Disease.    Anion gap 11 5 - 15  CBC with Differential     Status: Abnormal   Collection Time: 09/03/17  1:27 PM  Result Value Ref Range   WBC 7.4 4.0 - 10.5 K/uL   RBC 4.11 3.87 - 5.11 MIL/uL   Hemoglobin 10.9 (L) 12.0 - 15.0 g/dL   HCT 33.8 (L) 36.0 - 46.0 %   MCV 82.2 78.0 - 100.0 fL   MCH 26.5 26.0 - 34.0 pg   MCHC 32.2 30.0 - 36.0 g/dL   RDW 16.1 (H) 11.5 - 15.5 %   Platelets 393 150 - 400 K/uL   Neutrophils Relative % 73 %   Neutro Abs 5.3 1.7 - 7.7 K/uL   Lymphocytes Relative 18 %   Lymphs Abs 1.4 0.7 - 4.0 K/uL   Monocytes Relative 8 %   Monocytes Absolute 0.6 0.1 - 1.0 K/uL   Eosinophils Relative 1 %   Eosinophils Absolute 0.1 0.0 - 0.7 K/uL   Basophils Relative 0 %   Basophils Absolute 0.0 0.0 - 0.1 K/uL  I-Stat CG4 Lactic Acid, ED     Status: Abnormal   Collection Time: 09/03/17  2:37 PM  Result Value Ref Range   Lactic Acid, Venous 1.97 (H) 0.5 - 1.9 mmol/L  I-Stat CG4 Lactic Acid, ED     Status: None   Collection Time: 09/03/17  4:25 PM  Result Value Ref Range   Lactic Acid, Venous 1.10 0.5 - 1.9 mmol/L   Dg Foot Complete Left  Result Date: 09/03/2017 CLINICAL DATA:  Necrotic left foot ulcers concentrated at heel EXAM: LEFT FOOT - COMPLETE 3+ VIEW COMPARISON:  None. FINDINGS: There is a soft tissue ulcer around the dorsal margin of the healed that extends to the dorsal calcaneal tuberosity. There is no bone resorption to suggest osteomyelitis. No fracture. Joints are normally aligned. Bones are extensively demineralized. There is diffuse soft tissue swelling centered at the ankle and hindfoot. IMPRESSION: 1. No radiographic evidence of osteomyelitis. 2. Soft tissue ulcer along the dorsal margin of the healed. Electronically Signed   By: Lajean Manes M.D.   On: 09/03/2017 14:05   Dg Foot Complete Right  Result Date: 09/03/2017 CLINICAL DATA:  Necrotic right  foot ulcers concentrated right heel EXAM: RIGHT FOOT COMPLETE - 3+ VIEW COMPARISON:  None. FINDINGS: There is bone resorption along the calcaneal tuberosity consistent with osteomyelitis, associated with an overlying soft tissue ulcer. There are no  other areas of bone resorption to suggest additional regions of osteomyelitis. No fracture. Joints are normally aligned. Bones are extensively demineralized. There is soft tissue swelling centered around the ankle and hindfoot. IMPRESSION: 1. Osteomyelitis of the calcaneal tuberosity. Electronically Signed   By: Lajean Manes M.D.   On: 09/03/2017 14:03    Pending Labs Unresulted Labs (From admission, onward)   Start     Ordered   Pending  Hemoglobin A1c  Once,   R     Pending   Pending  HIV antibody  Once,   R     Pending   Pending  Sedimentation rate  Once,   R     Pending   Pending  C-reactive protein  Once,   R     Pending   Pending  Prealbumin  Once,   R     Pending   Pending  Blood Cultures x 2 sites  BLOOD CULTURE X 2,   R     Pending   Signed and Held  Hemoglobin A1c  Once,   R     Signed and Held   Signed and Held  HIV antibody  Once,   R     Signed and Held   Signed and Held  Sedimentation rate  Once,   R     Signed and Held   Signed and Held  C-reactive protein  Once,   R     Signed and Held   Signed and Held  Prealbumin  Once,   R     Signed and Held   Signed and Held  Blood Cultures x 2 sites  BLOOD CULTURE X 2,   R     Signed and Held   Signed and Held  CBC  (heparin)  Once,   R    Comments:  Baseline for heparin therapy IF NOT ALREADY DRAWN.  Notify MD if PLT < 100 K.    Signed and Held   Signed and Held  Creatinine, serum  (heparin)  Once,   R    Comments:  Baseline for heparin therapy IF NOT ALREADY DRAWN.    Signed and Held      Vitals/Pain Today's Vitals   09/03/17 1431 09/03/17 1500 09/03/17 1641 09/03/17 1642  BP: (!) 142/71 131/73  (!) 148/70  Pulse: 86   92  Resp: '19 20  14  '$ Temp:   (S) 98.2 F (36.8  C)   TempSrc:   Oral   SpO2: 100%   96%  PainSc:        Isolation Precautions No active isolations  Medications Medications - No data to display  Mobility non-ambulatory

## 2017-09-03 NOTE — ED Triage Notes (Signed)
Per EMS, Pt is from home and has been bed ridden for 8 months due to MS and presents today with right leg wounds, the worst being on her right heel. Also present with bilateral lower extremity swelling. EMS reports that cognition is at baseline at AOx3. Hx of DM and MS.

## 2017-09-03 NOTE — Progress Notes (Signed)
Hypoglycemic Event  CBG: 49  Treatment: 15 GM carbohydrate snack  Symptoms: None  Follow-up CBG: Time:2208 CBG Result:117  Possible Reasons for Event: Inadequate meal intake  Comments/MD notified:Yes    Hannah Lamb Darell Saputo

## 2017-09-03 NOTE — Progress Notes (Signed)
A consult was received from an ED physician for vancomycin per pharmacy dosing.  The patient's profile has been reviewed for ht/wt/allergies/indication/available labs.   A one time order has been placed for 1g.  Further antibiotics/pharmacy consults should be ordered by admitting physician if indicated.                       Thank you, Berkley Harvey 09/03/2017  2:31 PM

## 2017-09-03 NOTE — ED Provider Notes (Signed)
[facility]-EMERGENCY DEPT Provider Note   CSN: [id number] Arrival date & time: 09/03/17  1141     History   Chief Complaint Chief Complaint  Patient presents with  . Skin Ulcer    HPI Hannah Lamb is a 76 y.o. female.  HPI Patient presents to the emergency department with multiple decubitus ulcers noted on her lower extremities.  The patient has MS and has been bedbound over the last 8 months.  The one on the right heel is the one that is the most concerning to the family.  The patient cannot give me any significant history.  The patient has not been treated for these ulcers in the past.  The patient denies chest pain, shortness of breath, headache,blurred vision, neck pain, fever, cough, weakness, numbness, dizziness, anorexia, edema, abdominal pain, nausea, vomiting, diarrhea, rash, back pain, dysuria, hematemesis, bloody stool, near syncope, or syncope. Past Medical History:  Diagnosis Date  . Anxiety   . Bilateral chronic knee pain   . Diabetes mellitus   . Goiter   . Hyperlipidemia   . Hypertension   . MS (multiple sclerosis) ([facility])   . Muscular dystrophy   . Myocardial infarction ([facility])   . Stroke ([facility])   . Syncope    recurrent    Patient Active Problem List   Diagnosis Date Noted  . Decubitus ulcer of buttock, stage 2 07/15/2015  . Vaginal discharge 07/15/2015  . Seizure ([facility]) 11/18/2014  . Cholecystitis 10/20/2014  . Hypertension 10/20/2014  . Diabetes mellitus type 2, controlled ([facility]) 10/20/2014  . Hypokalemia 10/20/2014  . Acute cholecystitis 10/20/2014  . Syncope 07/04/2014  . Shoulder pain, acute/right 01/07/2013  . UTI (urinary tract infection) 01/07/2013  . Malignant hypertension 01/06/2013  . History of CVA (cerebrovascular accident) 02/21/2012  . Abnormal EKG 02/16/2012  . Weakness generalized 02/15/2012  . Hypertensive emergency 02/15/2012  . Multiple sclerosis ([facility]) 02/15/2012  . HYPERCHOLESTEROLEMIA 11/09/2006  .  HYPERTENSION, BENIGN SYSTEMIC 11/09/2006  . ARTHRITIS 11/09/2006    Past Surgical History:  Procedure Laterality Date  . ABDOMINAL HYSTERECTOMY    . CHOLECYSTECTOMY N/A 10/23/2014   Procedure: LAPAROSCOPIC CHOLECYSTECTOMY WITH INTRAOPERATIVE CHOLANGIOGRAM;  Surgeon: Harriette Bouillon, MD;  Location: [facility] OR;  Service: General;  Laterality: N/A;    OB History    No data available       Home Medications    Prior to Admission medications   Medication Sig Start Date End Date Taking? Authorizing Provider  B-D INS SYRINGE 0.5CC/30GX1/2" 30G X 1/2" 0.5 ML MISC  10/28/14  Yes [provider]  diltiazem (CARDIZEM SR) 60 MG 12 hr capsule Take 1 capsule (60 mg total) by mouth daily. 11/20/14  Yes Osvaldo Shipper, MD  HUMULIN 70/30 (70-30) 100 UNIT/ML injection Inject 15 Units into the skin 2 (two) times daily with a meal.  03/17/14  Yes [provider]  ibuprofen (ADVIL,MOTRIN) 200 MG tablet Take 200 mg by mouth every 6 (six) hours as needed for mild pain.   Yes [provider]  irbesartan (AVAPRO) 300 MG tablet Take 1 tablet (300 mg total) by mouth daily. 01/08/13  Yes Oti, Osie Cheeks, MD  potassium chloride (K-DUR) 10 MEQ tablet Take 10 mEq by mouth daily.   Yes [provider]  levETIRAcetam (KEPPRA) 250 MG tablet Take 1 tablet (250 mg total) by mouth 2 (two) times daily. Patient not taking: Reported on 09/03/2017 11/20/14   Osvaldo Shipper, MD  metroNIDAZOLE (FLAGYL) 500 MG tablet Take 1 tablet (500  mg total) by mouth 2 (two) times daily. Patient not taking: Reported on 09/03/2017 07/17/15   Maretta BeesGhimire, Shanker M, MD  oxyCODONE (ROXICODONE) 5 MG immediate release tablet Take 1 tablet (5 mg total) by mouth every 6 (six) hours as needed for moderate pain. Patient not taking: Reported on 09/03/2017 07/17/15   Maretta BeesGhimire, Shanker M, MD  Rivaroxaban (XARELTO STARTER PACK) 15 & 20 MG TBPK 15 mg, Oral, 2 times daily with meals, till 08/07/15, then, 20 mg oral once daily from  08/08/15 Patient not taking: Reported on 09/03/2017 07/17/15   Maretta BeesGhimire, Shanker M, MD  rivaroxaban (XARELTO) 20 MG TABS tablet Take 1 tablet (20 mg total) by mouth daily. START ON 08/08/15 Patient not taking: Reported on 09/03/2017 08/08/15   Maretta BeesGhimire, Shanker M, MD    Family History Family History  Problem Relation Age of Onset  . Diabetes Father     Social History Social History   Tobacco Use  . Smoking status: Never Smoker  . Smokeless tobacco: Never Used  Substance Use Topics  . Alcohol use: No  . Drug use: No     Allergies   Sulfa drugs cross reactors and Lipitor [atorvastatin]   Review of Systems Review of Systems  All other systems negative except as documented in the HPI. All pertinent positives and negatives as reviewed in the HPI. Physical Exam Updated Vital Signs BP 122/83   Resp 20   SpO2 97%   Physical Exam  Constitutional: She is oriented to person, place, and time. She appears well-developed and well-nourished. No distress.  HENT:  Head: Normocephalic and atraumatic.  Mouth/Throat: Oropharynx is clear and moist.  Eyes: Pupils are equal, round, and reactive to light.  Neck: Normal range of motion. Neck supple.  Cardiovascular: Normal rate, regular rhythm and normal heart sounds. Exam reveals no gallop and no friction rub.  No murmur heard. Pulmonary/Chest: Effort normal and breath sounds normal. No respiratory distress. She has no wheezes.  Musculoskeletal:       Feet:  Neurological: She is alert and oriented to person, place, and time. She exhibits normal muscle tone. Coordination normal.  Skin: Skin is warm and dry. Capillary refill takes less than 2 seconds. No rash noted. No erythema.  Psychiatric: She has a normal mood and affect. Her behavior is normal.  Nursing note and vitals reviewed.    ED Treatments / Results  Labs (all labs ordered are listed, but only abnormal results are displayed) Labs Reviewed  BASIC METABOLIC PANEL - Abnormal;  Notable for the following components:      Result Value   Sodium 134 (*)    Chloride 98 (*)    Creatinine, Ser 0.38 (*)    Calcium 8.7 (*)    All other components within normal limits  CBC WITH DIFFERENTIAL/PLATELET - Abnormal; Notable for the following components:   Hemoglobin 10.9 (*)    HCT 33.8 (*)    RDW 16.1 (*)    All other components within normal limits  I-STAT CG4 LACTIC ACID, ED - Abnormal; Notable for the following components:   Lactic Acid, Venous 1.97 (*)    All other components within normal limits    EKG  EKG Interpretation None       Radiology Dg Foot Complete Left  Result Date: 09/03/2017 CLINICAL DATA:  Necrotic left foot ulcers concentrated at heel EXAM: LEFT FOOT - COMPLETE 3+ VIEW COMPARISON:  None. FINDINGS: There is a soft tissue ulcer around the dorsal margin of the healed that extends  to the dorsal calcaneal tuberosity. There is no bone resorption to suggest osteomyelitis. No fracture. Joints are normally aligned. Bones are extensively demineralized. There is diffuse soft tissue swelling centered at the ankle and hindfoot. IMPRESSION: 1. No radiographic evidence of osteomyelitis. 2. Soft tissue ulcer along the dorsal margin of the healed. Electronically Signed   By: Amie [place] M.D.   On: 09/03/2017 14:05   Dg Foot Complete Right  Result Date: 09/03/2017 CLINICAL DATA:  Necrotic right foot ulcers concentrated right heel EXAM: RIGHT FOOT COMPLETE - 3+ VIEW COMPARISON:  None. FINDINGS: There is bone resorption along the calcaneal tuberosity consistent with osteomyelitis, associated with an overlying soft tissue ulcer. There are no other areas of bone resorption to suggest additional regions of osteomyelitis. No fracture. Joints are normally aligned. Bones are extensively demineralized. There is soft tissue swelling centered around the ankle and hindfoot. IMPRESSION: 1. Osteomyelitis of the calcaneal tuberosity. Electronically Signed   By: Amie [place] M.D.    On: 09/03/2017 14:03    Procedures Procedures (including critical care time)  Medications Ordered in ED Medications  piperacillin-tazobactam (ZOSYN) IVPB 3.375 g (not administered)  vancomycin (VANCOCIN) IVPB 1000 mg/200 mL premix (not administered)     Initial Impression / Assessment and Plan / ED Course  I have reviewed the triage vital signs and the nursing notes.  Pertinent labs & imaging results that were available during my care of the patient were reviewed by me and considered in my medical decision making (see chart for details).     Patient will need admission to the hospital for osteomyelitis of the heel.  The hospitalist did want me to hold off on antibiotics at this time.  Final Clinical Impressions(s) / ED Diagnoses   Final diagnoses:  Acute hematogenous osteomyelitis of right foot [facility])    ED Discharge Orders    None       Charlestine Night, PA-C 09/03/17 1459    Lavera Guise, MD 09/03/17 [phone]

## 2017-09-03 NOTE — Progress Notes (Signed)
ANTIBIOTIC CONSULT NOTE - INITIAL  Pharmacy Consult for Vancomycin and Zosyn Indication: Wound infection, probable osteomyelitis  Allergies  Allergen Reactions  . Sulfa Drugs Cross Reactors Other (See Comments)    dizziness  . Lipitor [Atorvastatin] Other (See Comments)    Muscle weakness    Patient Measurements:   Adjusted Body Weight:  Vital Signs: Temp: 98.2 F (36.8 C) (12/23 1641) Temp Source: Oral (12/23 1641) BP: 125/69 (12/23 1800) Pulse Rate: 91 (12/23 1830) Intake/Output from previous day: No intake/output data recorded. Intake/Output from this shift: No intake/output data recorded.  Labs: Recent Labs    09/03/17 1327 09/03/17 1934  WBC 7.4 7.4  HGB 10.9* 10.7*  PLT 393 379  CREATININE 0.38*  --    CrCl cannot be calculated (Unknown ideal weight.). No results for input(s): VANCOTROUGH, VANCOPEAK, VANCORANDOM, GENTTROUGH, GENTPEAK, GENTRANDOM, TOBRATROUGH, TOBRAPEAK, TOBRARND, AMIKACINPEAK, AMIKACINTROU, AMIKACIN in the last 72 hours.   Microbiology: No results found for this or any previous visit (from the past 720 hour(s)).  Medical History: Past Medical History:  Diagnosis Date  . Anxiety   . Bilateral chronic knee pain   . Diabetes mellitus   . Goiter   . Hyperlipidemia   . Hypertension   . MS (multiple sclerosis) ([facility])   . Muscular dystrophy   . Myocardial infarction ([facility])   . Stroke ([facility])   . Syncope    recurrent    Medications:  Anti-infectives (From admission, onward)   Start     Dose/Rate Route Frequency Ordered Stop   09/03/17 2200  vancomycin (VANCOCIN) IVPB 1000 mg/200 mL premix     1,000 mg 200 mL/hr over 60 Minutes Intravenous  Once 09/03/17 2005     09/03/17 2100  piperacillin-tazobactam (ZOSYN) IVPB 3.375 g     3.375 g 100 mL/hr over 30 Minutes Intravenous STAT 09/03/17 2005 09/04/17 2100   09/03/17 1445  piperacillin-tazobactam (ZOSYN) IVPB 3.375 g  Status:  Discontinued     3.375 g 100 mL/hr over 30 Minutes Intravenous   Once 09/03/17 1430 09/03/17 1455   09/03/17 1445  vancomycin (VANCOCIN) IVPB 1000 mg/200 mL premix  Status:  Discontinued     1,000 mg 200 mL/hr over 60 Minutes Intravenous  Once 09/03/17 1431 09/03/17 1455     Assessment: 76 yo F presents from home w/ 1 month hx of foot and leg wounds. Patient is non-ambulatory due to MS. Wounds are severe and osteomyelitis is suspected. Ortho is consulted and MRI is ordered. Pharmacy is asked to dose broad-spectrum antibiotics. Vanc and Zosyn doses were ordered earlier then discontinued. Neither was given. SCr is low as expected in non-ambulatory patient.   Goal of Therapy:  Vancomycin trough level 15-20 mcg/ml  Plan:  Give Zosyn 3.375g IV STAT Give Vanc 1g IV once after that F/u weight and order further doses.  Measure Vanc trough at steady state. Follow up renal fxn, culture results, and clinical course.  Charolotte Eke, PharmD. [place]: [phone]. 09/03/2017,8:25 PM.

## 2017-09-03 NOTE — ED Notes (Signed)
Bed: WA08 Expected date:  Expected time:  Means of arrival:  Comments: 76 yo foot ulcer

## 2017-09-03 NOTE — Consult Note (Signed)
Patient ID: Hannah HubertBetty R Croslin MRN: [id number] DOB/AGE: 1941-03-24 76 y.o.  Admit date: 09/03/2017  Admission Diagnoses:  Active Problems:   Osteomyelitis of ankle (HCC)   HPI: Pleasant 76 year old female pt presents to the clinic with 1 month of foot and leg wounds.  Pt is accompanied by her daughter, who is at her bedside.  Pt also carries a diagnosis of Thyroid disease, Diabetes and MS.  She reports her feet are very painful.  She has been non-ambulatory because of the MS.   Past Medical History: Past Medical History:  Diagnosis Date  . Anxiety   . Bilateral chronic knee pain   . Diabetes mellitus   . Goiter   . Hyperlipidemia   . Hypertension   . MS (multiple sclerosis) (HCC)   . Muscular dystrophy   . Myocardial infarction (HCC)   . Stroke (HCC)   . Syncope    recurrent    Surgical History: Past Surgical History:  Procedure Laterality Date  . ABDOMINAL HYSTERECTOMY    . CHOLECYSTECTOMY N/A 10/23/2014   Procedure: LAPAROSCOPIC CHOLECYSTECTOMY WITH INTRAOPERATIVE CHOLANGIOGRAM;  Surgeon: Harriette Bouillonhomas Cornett, MD;  Location: [facility] OR;  Service: General;  Laterality: N/A;    Family History: Family History  Problem Relation Age of Onset  . Diabetes Father     Social History: Social History   Socioeconomic History  . Marital status: Widowed    Spouse name: Not on file  . Number of children: 7  . Years of education: 12th grade  . Highest education level: Not on file  Social Needs  . Financial resource strain: Not on file  . Food insecurity - worry: Not on file  . Food insecurity - inability: Not on file  . Transportation needs - medical: Not on file  . Transportation needs - non-medical: Not on file  Occupational History  . Not on file  Tobacco Use  . Smoking status: Never Smoker  . Smokeless tobacco: Never Used  Substance and Sexual Activity  . Alcohol use: No  . Drug use: No  . Sexual activity: No  Other Topics Concern  . Not on file  Social History  Narrative   Lives with daughter.   Patient is right handed.   Patient does not drink caffeine.    Allergies: Sulfa drugs cross reactors and Lipitor [atorvastatin]  Medications: I have reviewed the patient's current medications.  Vital Signs: Patient Vitals for the past 24 hrs:  BP Temp Temp src Pulse Resp SpO2  09/03/17 1642 (!) 148/70 - - 92 14 96 %  09/03/17 1641 - 98.2 F (36.8 C) Oral - - -  09/03/17 1500 131/73 - - - 20 -  09/03/17 1431 (!) 142/71 - - 86 19 100 %  09/03/17 1302 122/83 - - - 20 -  09/03/17 1155 - - - - - 97 %    Radiology: Dg Foot Complete Left  Result Date: 09/03/2017 CLINICAL DATA:  Necrotic left foot ulcers concentrated at heel EXAM: LEFT FOOT - COMPLETE 3+ VIEW COMPARISON:  None. FINDINGS: There is a soft tissue ulcer around the dorsal margin of the healed that extends to the dorsal calcaneal tuberosity. There is no bone resorption to suggest osteomyelitis. No fracture. Joints are normally aligned. Bones are extensively demineralized. There is diffuse soft tissue swelling centered at the ankle and hindfoot. IMPRESSION: 1. No radiographic evidence of osteomyelitis. 2. Soft tissue ulcer along the dorsal margin of the healed. Electronically Signed   By: Amie Portlandavid  Ormond  M.D.   On: 09/03/2017 14:05   Dg Foot Complete Right  Result Date: 09/03/2017 CLINICAL DATA:  Necrotic right foot ulcers concentrated right heel EXAM: RIGHT FOOT COMPLETE - 3+ VIEW COMPARISON:  None. FINDINGS: There is bone resorption along the calcaneal tuberosity consistent with osteomyelitis, associated with an overlying soft tissue ulcer. There are no other areas of bone resorption to suggest additional regions of osteomyelitis. No fracture. Joints are normally aligned. Bones are extensively demineralized. There is soft tissue swelling centered around the ankle and hindfoot. IMPRESSION: 1. Osteomyelitis of the calcaneal tuberosity. Electronically Signed   By: Amie Portland M.D.   On: 09/03/2017  14:03    Labs: Recent Labs    09/03/17 1327  WBC 7.4  RBC 4.11  HCT 33.8*  PLT 393   Recent Labs    09/03/17 1327  NA 134*  K 5.0  CL 98*  CO2 25  BUN 10  CREATININE 0.38*  GLUCOSE 66  CALCIUM 8.7*   No results for input(s): LABPT, INR in the last 72 hours.  Review of Systems: ROS  Physical Exam: Neurologically intact ABD soft Sensation intact distally Compartment soft    Assessment and Plan: Wash out wounds Apply dressings NPO after midnight Dr. Shon Baton will consult Dr. Lajoyce Corners Internal medicine will admit Ordered b/l foot MRI   Addendum dictation by Dr. Shon Baton: Patient had bilateral significant heel ulcerations.  Obvious necrotic tissue was debrided sharply at the bedside.  There was no significant bleeding during the procedure.  Patient had no sensation during the debridement process.  There is also obvious necrotic odor.  No frank abscess was noted.  After the gross debridement dry dressings were applied.  Inspection of the rest of her also revealed ulcerations right distal calf, as well as the left distal calf.  The right side was more prominent than the left.  There was significant soft tissue loss as well.  Patient will most likely require amputation for definitive treatment.  I have spoken with Dr. Lajoyce Corners and he will be able to evaluate the patient this weekend as he is out of town.  Currently the patient shows no signs of sepsis or bacteremia and is hemodynamically stable.  Given this I do not think there is a need for urgent surgical debridement.  My recommendation is wound care evaluation for dressing changes and any other recommendations.  Continue IV antibiotics and conservative management until Dr. Lajoyce Corners can evaluate the patient determine if amputation is required.  We will continue to monitor the patient all she is admitted to the hospital service. Anette Riedel PAC for Venita Lick, MD [facility] [phone]

## 2017-09-03 NOTE — H&P (Addendum)
History and Physical  Hannah Lamb:[id number] DOB: 1941-06-29 DOA: 09/03/2017  Referring physician: Dr. Cathi Roan PCP: Elwyn Reach, MD  Outpatient Specialists: None Patient coming from:Home At her baseline patient is bed ridden  Chief Complaint: right ankle wound  HPI: Hannah Lamb is a 76 y.o. female with medical history significant for MS, hypertension, type 2 diabetes, seizure history who presents on 09/03/2017 with right ankle wound was found to have right calcaneal tuberosity osteomyelitis.  History obtained from patient's daughter who states she noted approximately a month ago increased softness of her right heel.  She reports she takes care of her mother 24/7.  She is unclear how the sores started she reports using pillows to lift her heels the patient is bedridden due to her MS.  She states she has not been seen by her primary care physician in quite some time as it is difficult for her to move.  Denies any fevers, chills, nausea, chest pain. Daughter reports she has tried natural remedies like applying turmeric to the wound on the right heel as well as other chronic wounds on both lower extremities.   ED Course: Patient afebrile and hemodynamically stable.  Lactic acid 1.1, BMP unremarkable, CBC with WBC 7.4, hemoglobin 10.9, otherwise unremarkable. X-ray of left foot showed no signs of osteomyelitis but soft tissue ulcer along the dorsal margin.  X-ray of right foot showed osteomyelitis of calcaneal tuberosity.  Review of Systems:As mentioned in the history of present illness.Review of systems are otherwise negative Patient seen in the ED.    Past Medical History:  Diagnosis Date  . Anxiety   . Bilateral chronic knee pain   . Diabetes mellitus   . Goiter   . Hyperlipidemia   . Hypertension   . MS (multiple sclerosis) ([place])   . Muscular dystrophy   . Myocardial infarction ([place])   . Stroke ([place])   . Syncope    recurrent   Past Surgical History:  Procedure  Laterality Date  . ABDOMINAL HYSTERECTOMY    . CHOLECYSTECTOMY N/A 10/23/2014   Procedure: LAPAROSCOPIC CHOLECYSTECTOMY WITH INTRAOPERATIVE CHOLANGIOGRAM;  Surgeon: Erroll Luna, MD;  Location: [place];  Service: General;  Laterality: N/A;    Social History:  reports that  has never smoked. she has never used smokeless tobacco. She reports that she does not drink alcohol or use drugs.   Allergies  Allergen Reactions  . Sulfa Drugs Cross Reactors Other (See Comments)    dizziness  . Lipitor [Atorvastatin] Other (See Comments)    Muscle weakness    Family History  Problem Relation Age of Onset  . Diabetes Father      Prior to Admission medications   Medication Sig Start Date End Date Taking? Authorizing Provider  B-D INS SYRINGE 0.5CC/30GX1/2" 30G X 1/2" 0.5 ML MISC  10/28/14  Yes [provider]  diltiazem (CARDIZEM SR) 60 MG 12 hr capsule Take 1 capsule (60 mg total) by mouth daily. 11/20/14  Yes Bonnielee Haff, MD  HUMULIN 70/30 (70-30) 100 UNIT/ML injection Inject 15 Units into the skin 2 (two) times daily with a meal.  03/17/14  Yes [provider]  ibuprofen (ADVIL,MOTRIN) 200 MG tablet Take 200 mg by mouth every 6 (six) hours as needed for mild pain.   Yes [provider]  irbesartan (AVAPRO) 300 MG tablet Take 1 tablet (300 mg total) by mouth daily. 01/08/13  Yes Oti, Brantley Stage, MD  potassium chloride (K-DUR) 10 MEQ tablet Take 10 mEq by mouth  daily.   Yes [provider]  levETIRAcetam (KEPPRA) 250 MG tablet Take 1 tablet (250 mg total) by mouth 2 (two) times daily. Patient not taking: Reported on 09/03/2017 11/20/14   Bonnielee Haff, MD  metroNIDAZOLE (FLAGYL) 500 MG tablet Take 1 tablet (500 mg total) by mouth 2 (two) times daily. Patient not taking: Reported on 09/03/2017 07/17/15   Jonetta Osgood, MD  oxyCODONE (ROXICODONE) 5 MG immediate release tablet Take 1 tablet (5 mg total) by mouth every 6 (six) hours as needed for moderate  pain. Patient not taking: Reported on 09/03/2017 07/17/15   Jonetta Osgood, MD  Rivaroxaban (XARELTO STARTER PACK) 15 & 20 MG TBPK 15 mg, Oral, 2 times daily with meals, till 08/07/15, then, 20 mg oral once daily from 08/08/15 Patient not taking: Reported on 09/03/2017 07/17/15   Jonetta Osgood, MD  rivaroxaban (XARELTO) 20 MG TABS tablet Take 1 tablet (20 mg total) by mouth daily. START ON 08/08/15 Patient not taking: Reported on 09/03/2017 08/08/15   Jonetta Osgood, MD    Physical Exam: BP (!) 142/71   Pulse 86   Resp 19   SpO2 100%   General: Lying in bed in Appear in  No distress Eyes:EOMI ENT: moist oral mucosa Neck: no appreciable JVD Cardiovascular: regular rate and rhythm, no murmurs, rubs or gallops,  Respiratory: Normal respiratory effort, Clear to Auscultation on anterior chest field skin: Right heel with exposed tissue, bone apparent, macerated edges.  No apparent erythema or tenderness but overlying skin warm to touch of bilateral legs with obvious swelling.  Several chronic appearing ulcers with eschar on lower extremities as well bilaterally Neurologic: Alert oriented to person.  Unable to state place, contact/situation, or time accurately.  Is able to identify her daughter.  Able to move upper extremities without gross focal deficits.  Significant weakness in lower extremities, unable to follow commands there.          Labs on Admission:  Basic Metabolic Panel: Recent Labs  Lab 09/03/17 1327  NA 134*  K 5.0  CL 98*  CO2 25  GLUCOSE 66  BUN 10  CREATININE 0.38*  CALCIUM 8.7*   Liver Function Tests: No results for input(s): AST, ALT, ALKPHOS, BILITOT, PROT, ALBUMIN in the last 168 hours. No results for input(s): LIPASE, AMYLASE in the last 168 hours. No results for input(s): AMMONIA in the last 168 hours. CBC: Recent Labs  Lab 09/03/17 1327  WBC 7.4  NEUTROABS 5.3  HGB 10.9*  HCT 33.8*  MCV 82.2  PLT 393   Cardiac Enzymes: No results for  input(s): CKTOTAL, CKMB, CKMBINDEX, TROPONINI in the last 168 hours.  BNP (last 3 results) No results for input(s): BNP in the last 8760 hours.  ProBNP (last 3 results) No results for input(s): PROBNP in the last 8760 hours.  CBG: No results for input(s): GLUCAP in the last 168 hours.  Radiological Exams on Admission: Dg Foot Complete Left  Result Date: 09/03/2017 CLINICAL DATA:  Necrotic left foot ulcers concentrated at heel EXAM: LEFT FOOT - COMPLETE 3+ VIEW COMPARISON:  None. FINDINGS: There is a soft tissue ulcer around the dorsal margin of the healed that extends to the dorsal calcaneal tuberosity. There is no bone resorption to suggest osteomyelitis. No fracture. Joints are normally aligned. Bones are extensively demineralized. There is diffuse soft tissue swelling centered at the ankle and hindfoot. IMPRESSION: 1. No radiographic evidence of osteomyelitis. 2. Soft tissue ulcer along the dorsal margin of the healed.  Electronically Signed   By: Lajean Manes M.D.   On: 09/03/2017 14:05   Dg Foot Complete Right  Result Date: 09/03/2017 CLINICAL DATA:  Necrotic right foot ulcers concentrated right heel EXAM: RIGHT FOOT COMPLETE - 3+ VIEW COMPARISON:  None. FINDINGS: There is bone resorption along the calcaneal tuberosity consistent with osteomyelitis, associated with an overlying soft tissue ulcer. There are no other areas of bone resorption to suggest additional regions of osteomyelitis. No fracture. Joints are normally aligned. Bones are extensively demineralized. There is soft tissue swelling centered around the ankle and hindfoot. IMPRESSION: 1. Osteomyelitis of the calcaneal tuberosity. Electronically Signed   By: Lajean Manes M.D.   On: 09/03/2017 14:03    Assessment/Plan Present on Admission: . Osteomyelitis of ankle (HCC)  Active Problems:   Osteomyelitis of ankle (HCC)    <principal problem not specified> Right calcaneal tuberosity osteomyelitis.  Multiple risk factors  include type 2 diabetes and bedridden status secondary to MS.  No sepsis or sirs.Discussed with infectious disease who recommend hold off on antibiotics to increase yield of potential bone biopsy.  Discussed case with orthopedic team who will assess patient over the next 24 hours - hold antibiotics given no signs of sepsis (per ID recs),start empiric antibiotics if patient becomes febrile - Obtain ESR, CRP,ABIs, blood cultures x 2, A1c - ID consult after bone bx for antibiotics guidance  Type 2 diabetes - Novolin 15 units twice daily -Correction short -Obtain A1c acting insulin  Hypertension, controlled -Continue home diltiazem and irbesartan   DVT prophylaxis:  subcutaneous Heparin   Code Status: DNR  Family Communication: Daughter was present at bedside and updated appropriately at the time of interview.  Patient is Jehovah witness and declines any blood products.  Disposition Plan: monitor off antibiotics, will need bone biopsy and antibiotics directed on results. ABI for vascular as well   Consults called: Orthopedic  Admission status: Admitted as inpatient to med surg unit .      Desiree Hane MD [facility]  Pager [phone]  If 7PM-7AM, please contact night-coverage [email or web address] Password [email or web address]  09/03/2017, 4:18 PM

## 2017-09-03 NOTE — ED Notes (Signed)
This RN called report at 86. I attempted to bring her upstairs when the Ortho PA asked me to hold her in the ED for wound cleaning. I explained this could be done upstairs, but the MD said to do it in the ED. I asked Darl Pikes if this was appropriate and she said yes and to perform it here.

## 2017-09-03 NOTE — Consult Note (Signed)
WOC consult requested for foot wounds.  X-ray indicates; "There is bone resorption along the calcaneal tuberosity consistent with osteomyelitis, associated with an overlying soft tissue ulcer.' This complex medical condition is beyond the WOC scope of practice; please refer to the ortho service for further plan of care.   Please re-consult if further assistance is needed.  Thank-you,  Cammie Mcgeeawn Emberlin Verner MSN, RN, CWOCN, [place], CNS [phone]

## 2017-09-04 ENCOUNTER — Encounter (HOSPITAL_COMMUNITY): Payer: Medicare Other

## 2017-09-04 DIAGNOSIS — M869 Osteomyelitis, unspecified: Secondary | ICD-10-CM

## 2017-09-04 DIAGNOSIS — E876 Hypokalemia: Secondary | ICD-10-CM

## 2017-09-04 DIAGNOSIS — L97509 Non-pressure chronic ulcer of other part of unspecified foot with unspecified severity: Secondary | ICD-10-CM

## 2017-09-04 LAB — GLUCOSE, CAPILLARY
GLUCOSE-CAPILLARY: 167 mg/dL — AB (ref 65–99)
GLUCOSE-CAPILLARY: 96 mg/dL (ref 65–99)
Glucose-Capillary: 151 mg/dL — ABNORMAL HIGH (ref 65–99)
Glucose-Capillary: 156 mg/dL — ABNORMAL HIGH (ref 65–99)
Glucose-Capillary: 168 mg/dL — ABNORMAL HIGH (ref 65–99)
Glucose-Capillary: 208 mg/dL — ABNORMAL HIGH (ref 65–99)
Glucose-Capillary: 73 mg/dL (ref 65–99)

## 2017-09-04 LAB — C-REACTIVE PROTEIN: CRP: 6.9 mg/dL — ABNORMAL HIGH

## 2017-09-04 LAB — HEMOGLOBIN A1C
Hgb A1c MFr Bld: 7 % — ABNORMAL HIGH (ref 4.8–5.6)
Mean Plasma Glucose: 154.2 mg/dL

## 2017-09-04 LAB — PREALBUMIN: Prealbumin: 6.9 mg/dL — ABNORMAL LOW (ref 18–38)

## 2017-09-04 MED ORDER — ACETAMINOPHEN 325 MG PO TABS: 650.0000 mg | ORAL_TABLET | Freq: Four times a day (QID) | ORAL | Status: DC | PRN

## 2017-09-04 MED ORDER — TRAMADOL HCL 50 MG PO TABS: 50.0000 mg | ORAL_TABLET | Freq: Four times a day (QID) | ORAL | Status: DC | PRN

## 2017-09-04 MED ORDER — INSULIN ASPART 100 UNIT/ML ~~LOC~~ SOLN: 0.0000 [IU] | Freq: Every day | SUBCUTANEOUS | Status: DC

## 2017-09-04 MED ORDER — VANCOMYCIN HCL 10 G IV SOLR
1250.0000 mg | INTRAVENOUS | Status: DC
  Administered 2017-09-05: 1250 mg via INTRAVENOUS
  Filled 2017-09-04: qty 1250

## 2017-09-04 MED ORDER — MORPHINE SULFATE (PF) 4 MG/ML IV SOLN
2.0000 mg | Freq: Four times a day (QID) | INTRAVENOUS | Status: DC | PRN
  Administered 2017-09-04 – 2017-09-05 (×2): 2 mg via INTRAVENOUS
  Filled 2017-09-04 (×2): qty 1

## 2017-09-04 MED ORDER — ENSURE ENLIVE PO LIQD
237.0000 mL | Freq: Two times a day (BID) | ORAL | Status: DC
  Administered 2017-09-06: 237 mL via ORAL

## 2017-09-04 MED ORDER — DILTIAZEM HCL 30 MG PO TABS
30.0000 mg | ORAL_TABLET | Freq: Two times a day (BID) | ORAL | Status: DC
  Administered 2017-09-04 – 2017-09-06 (×5): 30 mg via ORAL
  Filled 2017-09-04 (×5): qty 1

## 2017-09-04 MED ORDER — INSULIN ASPART 100 UNIT/ML ~~LOC~~ SOLN
0.0000 [IU] | Freq: Three times a day (TID) | SUBCUTANEOUS | Status: DC
  Administered 2017-09-04: 2 [IU] via SUBCUTANEOUS
  Administered 2017-09-04 – 2017-09-05 (×2): 3 [IU] via SUBCUTANEOUS
  Administered 2017-09-05: 1 [IU] via SUBCUTANEOUS
  Administered 2017-09-05 – 2017-09-06 (×3): 2 [IU] via SUBCUTANEOUS

## 2017-09-04 MED ORDER — ADULT MULTIVITAMIN W/MINERALS CH
1.0000 | ORAL_TABLET | Freq: Every day | ORAL | Status: DC
Start: 2017-09-04 — End: 2017-09-06
  Administered 2017-09-04 – 2017-09-06 (×3): 1 via ORAL
  Filled 2017-09-04 (×3): qty 1

## 2017-09-04 MED ORDER — PIPERACILLIN-TAZOBACTAM 3.375 G IVPB
3.3750 g | Freq: Three times a day (TID) | INTRAVENOUS | Status: DC
  Administered 2017-09-04 – 2017-09-06 (×7): 3.375 g via INTRAVENOUS
  Filled 2017-09-04 (×7): qty 50

## 2017-09-04 NOTE — Progress Notes (Signed)
Patient wounds on right and left leg/ankle/feet dressings were changed at the request of the family. Wet to dry dressing placed/abd pads and wrapped with Kerlix. Patient tolerated the dressing change poor. Family at bedside including daughter. Will continue to monitor patient.

## 2017-09-04 NOTE — Progress Notes (Signed)
Patient daughter refuses heparin INJ  and IV  abt this morning. Had a length conversation over the phone with the other sister at bedside. After  much explanation regarding the need for the IV abt and Heparin,  Daughter refuses and said that ED nurse told her that patient is not going to be given any  abt  because it will mess her up. She also said that her mother do not have DVT and  should not be given  blood thinner even though it is for prophylactic.  Daughter also blame abt and heparin for the reason that her mother had a low grade temp.

## 2017-09-04 NOTE — Consult Note (Signed)
WOC nurse consulted for LE wound, reviewed chart.  Please see previous WOC nurse notes.  Orthopedic evaluation pending per Dr. Lajoyce Corners.    Thanks  Day Deery [employer], RN,CWOCN, CNS, CWON-AP [phone])

## 2017-09-04 NOTE — Progress Notes (Signed)
Spoke with Dr. Hanley Ben regarding concerns plan of care for the patient. He requested that someone from the Orthopedic team contact the patient's family to answer questions that they have.  Dr. Hanley Ben stated that he had spoken with Dr. Lajoyce Corners and that he would not be available until Saturday.  Charge RN asked Dr. Hanley Ben if he asked Dr. Lajoyce Corners if someone was on call for him in his absence and he stated that he did not get that information from Dr. Lajoyce Corners. Spoke with Dr. Charlann Boxer regarding Dr. Jarome Lamas request. Dr. Charlann Boxer called into the patient's room and spoke with her daughter who was at the bedside.   Arta Bruce [facility] 09/04/2017 1:01 PM

## 2017-09-04 NOTE — Progress Notes (Signed)
[facility] on call provider paged. Communication was that Dr. Charlann Boxer is on call and the on call PA and MD do not have information to contact Dr. Lajoyce Corners. Family is concerned about the patient being NPO and they are concerned about diabetic checks and Insulin. Will inform attending of communication and will continue to monitor patient.

## 2017-09-04 NOTE — Progress Notes (Signed)
Pharmacy Antibiotic Note  Hannah Lamb is a 76 y.o. female admitted on 09/03/2017 with Osteomyelitis.  Pharmacy has been consulted for Vancomycin and Zosyn  dosing.  Plan: Zosyn 3.375g IV q8h (4 hour infusion).  Vancomycin 1gm iv x1, then 1250mg  iv q36hr Goal AUC = 400 - 500 for all indications, except meningitis (goal AUC > 500 and Cmin 15-20 mcg/mL)    Height: 5\' 8"  (172.7 cm) Weight: 129 lb (58.5 kg) IBW/kg (Calculated) : 63.9  Temp (24hrs), Avg:98 F (36.7 C), Min:97.7 F (36.5 C), Max:98.2 F (36.8 C)  Recent Labs  Lab 09/03/17 1327 09/03/17 1437 09/03/17 1625 09/03/17 1934  WBC 7.4  --   --  7.4  CREATININE 0.38*  --   --  0.37*  LATICACIDVEN  --  1.97* 1.10  --     Estimated Creatinine Clearance: 54.4 mL/min (A) (by C-G formula based on SCr of 0.37 mg/dL (L)).    Allergies  Allergen Reactions  . Sulfa Drugs Cross Reactors Other (See Comments)    dizziness  . Lipitor [Atorvastatin] Other (See Comments)    Muscle weakness    Antimicrobials this admission: 12/23 Vanc >>  12/23 Zosyn >>   Dose adjustments this admission: -  Microbiology results: pending  Thank you for allowing pharmacy to be a part of this patient's care.  Aleene DavidsonGrimsley Jr, Isaac Dubie Crowford 09/04/2017 4:17 AM

## 2017-09-04 NOTE — Progress Notes (Signed)
Patient ID: Hannah Lamb, female   DOB: November 18, 1940, 76 y.o.   MRN: [id number]  PROGRESS NOTE    CYNDI MONTEJANO  UUV:[id number] DOB: [birth year] DOA: 09/03/2017 PCP: Rometta Emery, MD   Brief Narrative:  76 year old female with history of MS, currently bedbound, hypertension, diabetes mellitus type 2, seizure history presented with right ankle wound and was found to have right calcaneal tuberosity osteomyelitis. Orthopedics was consulted.  Assessment & Plan:   Active Problems:   Osteomyelitis of ankle (HCC)  Right calcaneal tuberosity osteomyelitis -  Admitting hospitalist discussed with infectious diseases who recommended to hold off on antibiotics prior to bone biopsy.  - Orthopedics following. Status post bedside debridement by orthopedics. Patient might need amputation. Awaiting evaluation by Dr. Lajoyce Corners. Currently on antibiotics as per orthopedics   Type 2 diabetes with hypoglycemia - Hold scheduled insulin. Accu-Cheks with coverage. - Hemoglobin A1c is 7  Hypertension, controlled -Continue home diltiazem and irbesartan  History of multiple sclerosis and bedbound status - Fall precautions  DVT prophylaxis: Subcutaneous heparin Code Status:  DO NOT RESUSCITATE Family Communication: Spoke to daughter admits Disposition Plan: Depends on clinical outcome  Consultants: Orthopedics  Procedures: None  Antimicrobials: Vancomycin and Zosyn from 09/03/2017 onwards   Subjective: Patient seen and examined at bedside. She is awake but does not answer much questions. Spoke to daughter at bedside. No overnight fever or vomiting.  Objective: Vitals:   09/03/17 2044 09/04/17 0144 09/04/17 0429 09/04/17 0652  BP: (!) 143/82  133/74   Pulse: 94  78   Resp: 16  16   Temp: 97.7 F (36.5 C)  100.2 F (37.9 C) 99.5 F (37.5 C)  TempSrc: Oral  Oral Oral  SpO2: 100%  91%   Weight:  58.5 kg (129 lb)    Height:  5\' 8"  (1.727 m)     No intake or output data in the 24 hours ending  09/04/17 1113 Filed Weights   09/04/17 0144  Weight: 58.5 kg (129 lb)    Examination:  General exam: Appears calm and comfortable  Respiratory system: Bilateral decreased breath sound at bases Cardiovascular system: S1 & S2 heard, rate controlled Gastrointestinal system: Abdomen is nondistended, soft and nontender. Normal bowel sounds heard. Extremities: No cyanosis, clubbing; bilateral lower extremity dressing present   Data Reviewed: I have personally reviewed following labs and imaging studies  CBC: Recent Labs  Lab 09/03/17 1327 09/03/17 1934  WBC 7.4 7.4  NEUTROABS 5.3  --   HGB 10.9* 10.7*  HCT 33.8* 32.2*  MCV 82.2 81.1  PLT 393 379   Basic Metabolic Panel: Recent Labs  Lab 09/03/17 1327 09/03/17 1934  NA 134*  --   K 5.0  --   CL 98*  --   CO2 25  --   GLUCOSE 66  --   BUN 10  --   CREATININE 0.38* 0.37*  CALCIUM 8.7*  --    GFR: Estimated Creatinine Clearance: 54.4 mL/min (A) (by C-G formula based on SCr of 0.37 mg/dL (L)). Liver Function Tests: No results for input(s): AST, ALT, ALKPHOS, BILITOT, PROT, ALBUMIN in the last 168 hours. No results for input(s): LIPASE, AMYLASE in the last 168 hours. No results for input(s): AMMONIA in the last 168 hours. Coagulation Profile: No results for input(s): INR, PROTIME in the last 168 hours. Cardiac Enzymes: No results for input(s): CKTOTAL, CKMB, CKMBINDEX, TROPONINI in the last 168 hours. BNP (last 3 results) No results for input(s): PROBNP in the last 8760 hours.  HbA1C: Recent Labs    09/03/17 1934  HGBA1C 7.0*   CBG: Recent Labs  Lab 09/03/17 2123 09/03/17 2208 09/04/17 0408 09/04/17 0752 09/04/17 0941  GLUCAP 49* 117* 151* 156* 167*   Lipid Profile: No results for input(s): CHOL, HDL, LDLCALC, TRIG, CHOLHDL, LDLDIRECT in the last 72 hours. Thyroid Function Tests: No results for input(s): TSH, T4TOTAL, FREET4, T3FREE, THYROIDAB in the last 72 hours. Anemia Panel: No results for input(s):  VITAMINB12, FOLATE, FERRITIN, TIBC, IRON, RETICCTPCT in the last 72 hours. Sepsis Labs: Recent Labs  Lab 09/03/17 1437 09/03/17 1625  LATICACIDVEN 1.97* 1.10    No results found for this or any previous visit (from the past 240 hour(s)).       Radiology Studies: Dg Foot Complete Left  Result Date: 09/03/2017 CLINICAL DATA:  Necrotic left foot ulcers concentrated at heel EXAM: LEFT FOOT - COMPLETE 3+ VIEW COMPARISON:  None. FINDINGS: There is a soft tissue ulcer around the dorsal margin of the healed that extends to the dorsal calcaneal tuberosity. There is no bone resorption to suggest osteomyelitis. No fracture. Joints are normally aligned. Bones are extensively demineralized. There is diffuse soft tissue swelling centered at the ankle and hindfoot. IMPRESSION: 1. No radiographic evidence of osteomyelitis. 2. Soft tissue ulcer along the dorsal margin of the healed. Electronically Signed   By: Amie Portlandavid  Ormond M.D.   On: 09/03/2017 14:05   Dg Foot Complete Right  Result Date: 09/03/2017 CLINICAL DATA:  Necrotic right foot ulcers concentrated right heel EXAM: RIGHT FOOT COMPLETE - 3+ VIEW COMPARISON:  None. FINDINGS: There is bone resorption along the calcaneal tuberosity consistent with osteomyelitis, associated with an overlying soft tissue ulcer. There are no other areas of bone resorption to suggest additional regions of osteomyelitis. No fracture. Joints are normally aligned. Bones are extensively demineralized. There is soft tissue swelling centered around the ankle and hindfoot. IMPRESSION: 1. Osteomyelitis of the calcaneal tuberosity. Electronically Signed   By: Amie Portlandavid  Ormond M.D.   On: 09/03/2017 14:03        Scheduled Meds: . diltiazem  30 mg Oral BID  . feeding supplement (ENSURE ENLIVE)  237 mL Oral BID BM  . heparin  5,000 Units Subcutaneous Q8H  . insulin aspart  0-5 Units Subcutaneous QHS  . insulin aspart  0-9 Units Subcutaneous TID WC  . irbesartan  300 mg Oral Daily   .  morphine injection  2 mg Intravenous Once  . multivitamin with minerals  1 tablet Oral Daily   Continuous Infusions: . piperacillin-tazobactam (ZOSYN)  IV    . [START ON 09/05/2017] vancomycin       LOS: 1 day        Glade LloydKshitiz Eyal Greenhaw, MD [facility] Pager [phone]  If 7PM-7AM, please contact night-coverage [email or web address] Password [email or web address] 09/04/2017, 11:13 AM

## 2017-09-04 NOTE — Progress Notes (Addendum)
Initial Nutrition Assessment  INTERVENTION:   -Provide Ensure Enlive po BID, each supplement provides 350 kcal and 20 grams of protein -Provide Multivitamin with minerals daily  RD will continue to monitor  NUTRITION DIAGNOSIS:   Increased nutrient needs related to wound healing as evidenced by estimated needs.  GOAL:   Patient will meet greater than or equal to 90% of their needs  MONITOR:   PO intake, Supplement acceptance, Labs, Weight trends, I & O's, Skin  REASON FOR ASSESSMENT:   Consult Wound healing  ASSESSMENT:    76 year old female pt presents to the clinic with 1 month of foot and leg wounds.  Pt is accompanied by her daughter, who is at her bedside.  Pt also carries a diagnosis of Thyroid disease, Diabetes and MS.  She reports her feet are very painful.  She has been non-ambulatory because of the MS.   Patient with no diet order. Awaiting bone biopsy. Pt with multiple wounds that increase her nutritional needs. Pt has exhibited weight loss since 2013 but insignificant for any recent time frame. Recommend Ensure supplements and MVI once diet is advanced.  12/24 1053: Diet advanced. Supplements ordered.  Medications reviewed. Labs reviewed: CBGs: 156-167   NUTRITION - FOCUSED PHYSICAL EXAM:  Nutrition focused physical exam shows no sign of depletion of muscle mass or body fat.  -Non-ambulatory d/t MS  Diet Order:  No diet orders on file  EDUCATION NEEDS:   No education needs have been identified at this time  Skin:  Skin Assessment: Skin Integrity Issues: Skin Integrity Issues:: Stage II Stage II: buttocks, hip, coccyx  Last BM:  PTA  Height:   Ht Readings from Last 1 Encounters:  09/04/17 5\' 8"  (1.727 m)    Weight:   Wt Readings from Last 1 Encounters:  09/04/17 129 lb (58.5 kg)    Ideal Body Weight:  63.6 kg  BMI:  Body mass index is 19.61 kg/m.  Estimated Nutritional Needs:   Kcal:  1500-1700  Protein:  70-80g  Fluid:   1.7L/day  Tilda FrancoLindsey Amberlea Spagnuolo, MS, RD, LDN [facility] Clinical Dietitian Pager: [phone] After Hours Pager: [phone]

## 2017-09-05 LAB — CBC WITH DIFFERENTIAL/PLATELET
BASOS ABS: 0 10*3/uL (ref 0.0–0.1)
Basophils Relative: 0 %
EOS ABS: 0.1 10*3/uL (ref 0.0–0.7)
EOS PCT: 1 %
HCT: 29.4 % — ABNORMAL LOW (ref 36.0–46.0)
Hemoglobin: 9.5 g/dL — ABNORMAL LOW (ref 12.0–15.0)
LYMPHS PCT: 24 %
Lymphs Abs: 1.9 10*3/uL (ref 0.7–4.0)
MCH: 26.5 pg (ref 26.0–34.0)
MCHC: 32.3 g/dL (ref 30.0–36.0)
MCV: 82.1 fL (ref 78.0–100.0)
Monocytes Absolute: 0.8 10*3/uL (ref 0.1–1.0)
Monocytes Relative: 10 %
Neutro Abs: 5.1 10*3/uL (ref 1.7–7.7)
Neutrophils Relative %: 65 %
PLATELETS: 337 10*3/uL (ref 150–400)
RBC: 3.58 MIL/uL — AB (ref 3.87–5.11)
RDW: 16 % — ABNORMAL HIGH (ref 11.5–15.5)
WBC: 7.9 10*3/uL (ref 4.0–10.5)

## 2017-09-05 LAB — BASIC METABOLIC PANEL
ANION GAP: 11 (ref 5–15)
BUN: 14 mg/dL (ref 6–20)
CO2: 26 mmol/L (ref 22–32)
Calcium: 8.6 mg/dL — ABNORMAL LOW (ref 8.9–10.3)
Chloride: 98 mmol/L — ABNORMAL LOW (ref 101–111)
Creatinine, Ser: 0.46 mg/dL (ref 0.44–1.00)
Glucose, Bld: 182 mg/dL — ABNORMAL HIGH (ref 65–99)
POTASSIUM: 3.5 mmol/L (ref 3.5–5.1)
SODIUM: 135 mmol/L (ref 135–145)

## 2017-09-05 LAB — HIV ANTIBODY (ROUTINE TESTING W REFLEX): HIV Screen 4th Generation wRfx: NONREACTIVE

## 2017-09-05 LAB — HEMOGLOBIN A1C
HEMOGLOBIN A1C: 6.9 % — AB (ref 4.8–5.6)
MEAN PLASMA GLUCOSE: 151.33 mg/dL

## 2017-09-05 LAB — GLUCOSE, CAPILLARY
GLUCOSE-CAPILLARY: 167 mg/dL — AB (ref 65–99)
Glucose-Capillary: 192 mg/dL — ABNORMAL HIGH (ref 65–99)
Glucose-Capillary: 130 mg/dL — ABNORMAL HIGH (ref 65–99)
Glucose-Capillary: 134 mg/dL — ABNORMAL HIGH (ref 65–99)

## 2017-09-05 LAB — MAGNESIUM: MAGNESIUM: 1.6 mg/dL — AB (ref 1.7–2.4)

## 2017-09-05 MED ORDER — SODIUM CHLORIDE 0.9% FLUSH: 10.0000 mL | INTRAVENOUS | Status: DC | PRN

## 2017-09-05 MED ORDER — MAGNESIUM SULFATE 2 GM/50ML IV SOLN
2.0000 g | Freq: Once | INTRAVENOUS | Status: AC
  Administered 2017-09-05: 2 g via INTRAVENOUS
  Filled 2017-09-05: qty 50

## 2017-09-05 NOTE — Progress Notes (Signed)
Patient ID: Hannah Lamb, female   DOB: 09-07-1941, 76 y.o.   MRN: [id number]  PROGRESS NOTE    Hannah Lamb  UYE:[id number] DOB: 03-Aug-1941 DOA: 09/03/2017 PCP: Rometta Emery, MD   Brief Narrative:  76 year old female with history of MS, currently bedbound, hypertension, diabetes mellitus type 2, seizure history presented with right ankle wound and was found to have right calcaneal tuberosity osteomyelitis. Orthopedics was consulted.  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 Status post bedside debridement by orthopedics. Patient might need amputation. Awaiting evaluation by Dr. Lajoyce Corners. Currently on antibiotics as per orthopedics. - I have spoken to Dr. Lita Mains on phone on 09/04/2017 and she recommended that patient continue on intravenous antibiotics till evaluation by Dr. Lajoyce Corners (Dr. Lajoyce Corners will only be available for evaluation earliest by 09/09/2017). If patient remains stable, might consider sending the patient to nursing home on intravenous antibiotics and follow up with Dr. Lajoyce Corners as an outpatient as per Dr. Shon Baton. Consulted Child psychotherapist for the same. Will request for PICC line - Currently hemodynamically stable. Wound dressing as per orthopedics  Type 2 diabetes with hypoglycemia - Hold scheduled insulin. Accu-Cheks with coverage. - Hemoglobin A1c is 7  Hypomagnesemia -Replace. Repeat a.m. Labs  Hypertension, controlled -Continue home diltiazem and irbesartan  History of multiple sclerosis and bedbound status - Fall precautions  DVT prophylaxis: Subcutaneous heparin Code Status:  DO NOT RESUSCITATE Family Communication: Family member at bedside was sleeping when I had a conversation with the patient Disposition Plan: Probable nursing home on IV antibiotics  Consultants: Orthopedics  Procedures: None  Antimicrobials:  Vancomycin and Zosyn from 09/03/2017 onwards   Subjective: Patient seen and examined at bedside. She is awake but does not answer much questions. No overnight fever or vomiting.  Objective: Vitals:   09/04/17 1318 09/04/17 2104 09/05/17 0347 09/05/17 0446  BP: 134/67 127/70  (!) 155/86  Pulse: 93 (!) 118  (!) 103  Resp: 14 16  18   Temp: 99.6 F (37.6 C) 99.7 F (37.6 C) 99.1 F (37.3 C) 98.2 F (36.8 C)  TempSrc: Oral Axillary Oral Oral  SpO2: 99% 92%  100%  Weight:      Height:        Intake/Output Summary (Last 24 hours) at 09/05/2017 0847 Last data filed at 09/05/2017 0513 Gross per 24 hour  Intake 400 ml  Output 200 ml  Net 200 ml   Filed Weights   09/04/17 0144  Weight: 58.5 kg (129 lb)    Examination:  General exam: Appears calm and comfortable. No acute distress Respiratory system: Bilateral decreased breath sound at bases Cardiovascular system: S1 & S2 heard, rate controlled Gastrointestinal system: Abdomen is nondistended, soft and nontender. Normal bowel sounds heard. Extremities: No cyanosis, clubbing; bilateral lower extremity dressing present   Data Reviewed: I have personally reviewed following labs and imaging studies  CBC: Recent Labs  Lab 09/03/17 1327 09/03/17 1934 09/05/17 0510  WBC 7.4 7.4 7.9  NEUTROABS 5.3  --  5.1  HGB 10.9* 10.7* 9.5*  HCT 33.8* 32.2* 29.4*  MCV 82.2 81.1 82.1  PLT 393 379 337   Basic Metabolic Panel: Recent Labs  Lab 09/03/17 1327 09/03/17 1934 09/05/17 0510  NA 134*  --  135  K 5.0  --  3.5  CL 98*  --  98*  CO2 25  --  26  GLUCOSE 66  --  182*  BUN 10  --  14  CREATININE 0.38* 0.37* 0.46  CALCIUM 8.7*  --  8.6*  MG  --   --  1.6*   GFR: Estimated Creatinine Clearance: 54.4 mL/min (by C-G formula based on SCr of 0.46 mg/dL). Liver Function Tests: No results for input(s): AST, ALT, ALKPHOS, BILITOT, PROT, ALBUMIN in the last 168 hours. No results for input(s): LIPASE, AMYLASE in the last 168  hours. No results for input(s): AMMONIA in the last 168 hours. Coagulation Profile: No results for input(s): INR, PROTIME in the last 168 hours. Cardiac Enzymes: No results for input(s): CKTOTAL, CKMB, CKMBINDEX, TROPONINI in the last 168 hours. BNP (last 3 results) No results for input(s): PROBNP in the last 8760 hours. HbA1C: Recent Labs    09/03/17 1934  HGBA1C 7.0*   CBG: Recent Labs  Lab 09/04/17 1620 09/04/17 2103 09/04/17 2239 09/05/17 0341 09/05/17 0745  GLUCAP 208* 73 96 167* 192*   Lipid Profile: No results for input(s): CHOL, HDL, LDLCALC, TRIG, CHOLHDL, LDLDIRECT in the last 72 hours. Thyroid Function Tests: No results for input(s): TSH, T4TOTAL, FREET4, T3FREE, THYROIDAB in the last 72 hours. Anemia Panel: No results for input(s): VITAMINB12, FOLATE, FERRITIN, TIBC, IRON, RETICCTPCT in the last 72 hours. Sepsis Labs: Recent Labs  Lab 09/03/17 1437 09/03/17 1625  LATICACIDVEN 1.97* 1.10    No results found for this or any previous visit (from the past 240 hour(s)).       Radiology Studies: Dg Foot Complete Left  Result Date: 09/03/2017 CLINICAL DATA:  Necrotic left foot ulcers concentrated at heel EXAM: LEFT FOOT - COMPLETE 3+ VIEW COMPARISON:  None. FINDINGS: There is a soft tissue ulcer around the dorsal margin of the healed that extends to the dorsal calcaneal tuberosity. There is no bone resorption to suggest osteomyelitis. No fracture. Joints are normally aligned. Bones are extensively demineralized. There is diffuse soft tissue swelling centered at the ankle and hindfoot. IMPRESSION: 1. No radiographic evidence of osteomyelitis. 2. Soft tissue ulcer along the dorsal margin of the healed. Electronically Signed   By: Amie Portland M.D.   On: 09/03/2017 14:05   Dg Foot Complete Right  Result Date: 09/03/2017 CLINICAL DATA:  Necrotic right foot ulcers concentrated right heel EXAM: RIGHT FOOT COMPLETE - 3+ VIEW COMPARISON:  None. FINDINGS: There is  bone resorption along the calcaneal tuberosity consistent with osteomyelitis, associated with an overlying soft tissue ulcer. There are no other areas of bone resorption to suggest additional regions of osteomyelitis. No fracture. Joints are normally aligned. Bones are extensively demineralized. There is soft tissue swelling centered around the ankle and hindfoot. IMPRESSION: 1. Osteomyelitis of the calcaneal tuberosity. Electronically Signed   By: Amie Portland M.D.   On: 09/03/2017 14:03        Scheduled Meds: . diltiazem  30 mg Oral BID  . feeding supplement (ENSURE ENLIVE)  237 mL Oral BID BM  . heparin  5,000 Units Subcutaneous Q8H  . insulin aspart  0-5 Units Subcutaneous QHS  . insulin aspart  0-9 Units Subcutaneous TID WC  . irbesartan  300 mg Oral Daily  .  morphine injection  2 mg Intravenous Once  . multivitamin with minerals  1 tablet Oral Daily   Continuous Infusions: . piperacillin-tazobactam (ZOSYN)  IV 3.375 g (09/05/17 0512)  . vancomycin Stopped (09/05/17 1610)     LOS: 2 days        Glade Lloyd, MD [facility] Pager [phone]  If 7PM-7AM, please contact  night-coverage [email or web address] Password [email or web address] 09/05/2017, 8:47 AM

## 2017-09-05 NOTE — Progress Notes (Signed)
Peripherally Inserted Central Catheter/Midline Placement  The IV Nurse has discussed with the patient and/or persons authorized to consent for the patient, the purpose of this procedure and the potential benefits and risks involved with this procedure.  The benefits include less needle sticks, lab draws from the catheter, and the patient may be discharged home with the catheter. Risks include, but not limited to, infection, bleeding, blood clot (thrombus formation), and puncture of an artery; nerve damage and irregular heartbeat and possibility to perform a PICC exchange if needed/ordered by physician.  Alternatives to this procedure were also discussed.  Bard Power PICC patient education guide, fact sheet on infection prevention and patient information card has been provided to patient /or left at bedside.    PICC/Midline Placement Documentation    Consent obtained with daughter at bedside    Timmothy Soursewman, Vayden Weinand Renee 09/05/2017, 12:15 PM

## 2017-09-06 ENCOUNTER — Inpatient Hospital Stay (HOSPITAL_COMMUNITY): Payer: Medicare Other

## 2017-09-06 DIAGNOSIS — L8990 Pressure ulcer of unspecified site, unspecified stage: Secondary | ICD-10-CM

## 2017-09-06 LAB — CBC WITH DIFFERENTIAL/PLATELET
Basophils Absolute: 0 10*3/uL (ref 0.0–0.1)
Basophils Relative: 1 %
EOS ABS: 0.2 10*3/uL (ref 0.0–0.7)
EOS PCT: 2 %
HCT: 24.6 % — ABNORMAL LOW (ref 36.0–46.0)
HEMOGLOBIN: 7.9 g/dL — AB (ref 12.0–15.0)
LYMPHS ABS: 2 10*3/uL (ref 0.7–4.0)
Lymphocytes Relative: 30 %
MCH: 26.2 pg (ref 26.0–34.0)
MCHC: 32.1 g/dL (ref 30.0–36.0)
MCV: 81.5 fL (ref 78.0–100.0)
MONOS PCT: 10 %
Monocytes Absolute: 0.7 10*3/uL (ref 0.1–1.0)
Neutro Abs: 3.9 10*3/uL (ref 1.7–7.7)
Neutrophils Relative %: 57 %
PLATELETS: 290 10*3/uL (ref 150–400)
RBC: 3.02 MIL/uL — ABNORMAL LOW (ref 3.87–5.11)
RDW: 16 % — ABNORMAL HIGH (ref 11.5–15.5)
WBC: 6.7 10*3/uL (ref 4.0–10.5)

## 2017-09-06 LAB — GLUCOSE, CAPILLARY
GLUCOSE-CAPILLARY: 192 mg/dL — AB (ref 65–99)
GLUCOSE-CAPILLARY: 191 mg/dL — AB (ref 65–99)
Glucose-Capillary: 209 mg/dL — ABNORMAL HIGH (ref 65–99)
Glucose-Capillary: 172 mg/dL — ABNORMAL HIGH (ref 65–99)

## 2017-09-06 LAB — MAGNESIUM: MAGNESIUM: 1.8 mg/dL (ref 1.7–2.4)

## 2017-09-06 LAB — BASIC METABOLIC PANEL
Anion gap: 7 (ref 5–15)
BUN: 22 mg/dL — AB (ref 6–20)
CHLORIDE: 103 mmol/L (ref 101–111)
CO2: 28 mmol/L (ref 22–32)
CREATININE: 0.41 mg/dL — AB (ref 0.44–1.00)
Calcium: 8.1 mg/dL — ABNORMAL LOW (ref 8.9–10.3)
GFR calc Af Amer: >60 mL/min (ref >60)
GFR calc non Af Amer: >60 mL/min (ref >60)
GLUCOSE: 143 mg/dL — AB (ref 65–99)
Potassium: 3.1 mmol/L — ABNORMAL LOW (ref 3.5–5.1)
SODIUM: 138 mmol/L (ref 135–145)

## 2017-09-06 LAB — C-REACTIVE PROTEIN: CRP: 7.4 mg/dL — ABNORMAL HIGH (ref <1.0)

## 2017-09-06 MED ORDER — DAKINS (1/4 STRENGTH) 0.125 % EX SOLN: Freq: Two times a day (BID) | CUTANEOUS | 0 refills | Status: AC

## 2017-09-06 MED ORDER — ENSURE ENLIVE PO LIQD: 237.0000 mL | Freq: Two times a day (BID) | ORAL | 12 refills | Status: AC

## 2017-09-06 MED ORDER — HEPARIN SOD (PORK) LOCK FLUSH 100 UNIT/ML IV SOLN
250.0000 [IU] | INTRAVENOUS | Status: AC | PRN
  Administered 2017-09-06: 250 [IU]

## 2017-09-06 MED ORDER — POTASSIUM CHLORIDE CRYS ER 20 MEQ PO TBCR
60.0000 meq | EXTENDED_RELEASE_TABLET | Freq: Once | ORAL | Status: AC
  Administered 2017-09-06: 60 meq via ORAL
  Filled 2017-09-06: qty 3

## 2017-09-06 MED ORDER — PIPERACILLIN-TAZOBACTAM IV (FOR PTA / DISCHARGE USE ONLY): 3.3750 g | Freq: Three times a day (TID) | INTRAVENOUS | 0 refills | Status: AC

## 2017-09-06 MED ORDER — OXYCODONE HCL 5 MG PO TABS: 5.0000 mg | ORAL_TABLET | Freq: Four times a day (QID) | ORAL | 0 refills | Status: AC | PRN

## 2017-09-06 MED ORDER — DAKINS (1/4 STRENGTH) 0.125 % EX SOLN
Freq: Two times a day (BID) | CUTANEOUS | Status: DC
  Filled 2017-09-06: qty 473

## 2017-09-06 MED ORDER — VANCOMYCIN IV (FOR PTA / DISCHARGE USE ONLY): 1000.0000 mg | INTRAVENOUS | 0 refills | Status: AC

## 2017-09-06 MED ORDER — VANCOMYCIN HCL IN DEXTROSE 1-5 GM/200ML-% IV SOLN
1000.0000 mg | INTRAVENOUS | Status: DC
  Administered 2017-09-06: 1000 mg via INTRAVENOUS
  Filled 2017-09-06: qty 200

## 2017-09-06 NOTE — Evaluation (Signed)
Occupational Therapy Evaluation Patient Details Name: Hannah Lamb MRN: [id number] DOB: [birth year] Today's Date: 09/06/2017    History of Present Illness 76 year old female with history of MS, currently bedbound, hypertension, diabetes mellitus type 2, seizure history presented with right ankle wound and was found to have right calcaneal tuberosity osteomyelitis. Orthopedics was consulted.  Patient had bedside debridement by orthopedics   Clinical Impression   OT eval performed.  Pt is dependent with ADL activity but able to A with self feeding. OT issued red built up tubing for fork as well as  Tan theraputty for hands.  OT educated pt and daughter on exercise using theraputty with both hands as well as importance of performing daily ROM with BUE to maintain mobility of shoulders and elbows.      Follow Up Recommendations  No OT follow up    Equipment Recommendations  None recommended by OT       Precautions / Restrictions Precautions Precaution Comments: pt is bed bound      Mobility Bed Mobility Overal bed mobility: Needs Assistance             General bed mobility comments: dependent  Transfers Overall transfer level: Needs assistance               General transfer comment: pt is dependent        ADL either performed or assessed with clinical judgement   ADL Overall ADL's : Needs assistance/impaired Eating/Feeding: Moderate assistance Eating/Feeding Details (indicate cue type and reason): OT issues red built up handle to A with holding fork.                                    General ADL Comments: Pt in bed with lunch.  Pts HOB not raised. Educated pt and daughter on elevating HOB with eating     Vision Patient Visual Report: No change from baseline              Pertinent Vitals/Pain Pain Assessment: No/denies pain(did not report any with BUE assessment and HEP)     Hand Dominance     Extremity/Trunk Assessment Upper  Extremity Assessment Upper Extremity Assessment: Generalized weakness           Communication Communication Communication: No difficulties   Cognition Arousal/Alertness: Awake/alert Behavior During Therapy: WFL for tasks assessed/performed                                       General Comments   encouraged daughter to sit HOB up for feeding pt.  Pt almost flat in bed having lunch upon OT arrival    Exercises  tan theraputty for both hands        Home Living Family/patient expects to be discharged to:: Private residence Living Arrangements: Children Available Help at Discharge: Family Type of Home: Apartment Home Access: Level entry           Bathroom Shower/Tub: Other (comment)(bed bath only)         Home Equipment: Hospital bed   Additional Comments: pt is bedbound      Prior Functioning/Environment         bedbound with A with ADL activity                    OT Goals(Current goals can be  found in the care plan section) Acute Rehab OT Goals Patient Stated Goal: did not state  OT Frequency:      AM-PAC PT "6 Clicks" Daily Activity     Outcome Measure Help from another person eating meals?: A Lot Help from another person taking care of personal grooming?: A Lot Help from another person toileting, which includes using toliet, bedpan, or urinal?: Total Help from another person bathing (including washing, rinsing, drying)?: Total Help from another person to put on and taking off regular upper body clothing?: Total Help from another person to put on and taking off regular lower body clothing?: Total 6 Click Score: 8   End of Session    Activity Tolerance: Patient tolerated treatment well Patient left: in bed;with call bell/phone within reach;with family/visitor present                   Time: [id number] OT Time Calculation (min): 12 min Charges:  OT General Charges $OT Visit: 1 Visit OT Evaluation $OT Eval Moderate  Complexity: 1 Mod G-Codes:     Lise AuerLori Noha Milberger, OT [phone]  Einar CrowEDDING, Lizzie Cokley D 09/06/2017, 2:02 PM

## 2017-09-06 NOTE — Consult Note (Signed)
WOC Nurse wound consult note Reason for Consult: Full thickness unstageable pressure injuries to bilateral heels and posterior lower leg, involving Achilles' tendons Wound type: unstageable pressure injury Pressure Injury POA: Yes Measurement:Left heel:  4 cm x 3.5 cm 100% eschar to wound bed.  Foul, necrotic odor.  Right heel, Achilles tendon and posterior lower  Leg:  5 cm x 1 cm 95% adherent slough, 5% visible tendon Wound FGH:[id number] tissue Drainage (amount, consistency, odor) minimal serosanguinous  Necrotic odor, foul Periwound:intact Dressing procedure/placement/frequency: Cleanse bilateral heels with NS.  Apply Dakin'smoist gauze to wound bed.  Cover with dry 4x4 gauze and ABD pad.  Wrap with kerlix and tape.   Change twice daily. Discharging home.  Spoke with daughter concerning my recommendations.  Bedside RN will demonstrate and review this process when dakin's arrives to the unit.  Will not follow at this time.  Please re-consult if needed.  Maple Hudson RN BSN CWON Pager [phone]

## 2017-09-06 NOTE — Progress Notes (Addendum)
PHARMACY CONSULT NOTE FOR:  OUTPATIENT  PARENTERAL ANTIBIOTIC THERAPY (OPAT)  Indication: right ankle wound with osteomyelitis Regimen: Vancomycin 1g q24h (goal AUC 400-500) and Zosyn 3.375g IV q8h End date: pending surgical plans, discharge orders placed for 2 weeks worth of antibiotics per discussion with MD in anticipation that patient will be able to follow up with surgery before end of 2 weeks  IV antibiotic discharge orders are pended. To discharging provider:  please sign these orders via discharge navigator,  Select New Orders & click on the button choice - Manage This Unsigned Work.     Thank you for allowing pharmacy to be a part of this patient's care.  Clance Boll 09/06/2017, 11:37 AM

## 2017-09-06 NOTE — Progress Notes (Signed)
Patient ID: Hannah Lamb, female   DOB: May 29, 1941, 76 y.o.   MRN: [id number]  PROGRESS NOTE    Hannah Lamb  NWG:[id number] DOB: May 29, 1941 DOA: 09/03/2017 PCP: Rometta EmeryGarba, Mohammad L, MD   Brief Narrative:  76 year old female with history of MS, currently bedbound, hypertension, diabetes mellitus type 2, seizure history presented with right ankle wound and was found to have right calcaneal tuberosity osteomyelitis. Orthopedics was consulted.  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 Patient might need amputation. Awaiting evaluation by Dr. Lajoyce Cornersuda, inpatient/outpatient once Dr. Lajoyce Cornersuda is available. Currently on antibiotics as per orthopedics. - I had spoken to Dr. Lita MainsBrooks/orthopedics on phone on 09/04/2017 and he recommended that patient continue on intravenous antibiotics till evaluation by Dr. Lajoyce Cornersuda (Dr. Lajoyce Cornersuda will only be available for inpatient evaluation earliest by 09/09/2017). If patient remains stable, might consider sending the patient to nursing home on intravenous antibiotics and follow up with Dr. Lajoyce Cornersuda as an outpatient as per Dr. Shon BatonBrooks. Consulted Child psychotherapistsocial worker for the same. PICC line placed. - Currently hemodynamically stable. Wound dressing as per orthopedics - Spoke to daughter at bedside who thinks patient needs MRI before any further disposition plan. I'll defer MRI due to the orthopedics team as MRI was initially ordered and was then subsequently discontinued by the orthopedics team after evaluation of the wounds. I spoken to Dr. Marland KitchenSwinteck/orthopedics on call and mentioned that patient's family members still a lot of questions regarding further orthopedic management.  Type 2 diabetes with hypoglycemia - Hold scheduled insulin. Accu-Cheks with coverage. - Hemoglobin A1c is 7  Hypomagnesemia -Improved.     Hypokalemia -Replace. Repeat a.m. labs  Hypertension, controlled -Continue home diltiazem and irbesartan  History of multiple sclerosis and bedbound status - Fall precautions  DVT prophylaxis: Subcutaneous heparin Code Status:  DO NOT RESUSCITATE Family Communication: Spoke to daughters at bedside  Disposition Plan: Probable nursing home on IV antibiotics  Consultants: Orthopedics  Procedures: None  Antimicrobials: Vancomycin and Zosyn from 09/03/2017 onwards   Subjective: Patient seen and examined at bedside. She is awake but does not answer much questions. No overnight fever or vomiting.  Objective: Vitals:   09/05/17 0446 09/05/17 1239 09/05/17 2147 09/06/17 0538  BP: (!) 155/86 (!) 117/59 132/74 140/61  Pulse: (!) 103 81 85 85  Resp: 18 18 18 18   Temp: 98.2 F (36.8 C) 97.9 F (36.6 C) 98 F (36.7 C) 99.7 F (37.6 C)  TempSrc: Oral Oral Axillary Oral  SpO2: 100% 100% 100% 100%  Weight:      Height:        Intake/Output Summary (Last 24 hours) at 09/06/2017 1005 Last data filed at 09/06/2017 0540 Gross per 24 hour  Intake 210 ml  Output 800 ml  Net -590 ml   Filed Weights   09/04/17 0144  Weight: 58.5 kg (129 lb)    Examination:  General exam: Appears calm and comfortable.  Respiratory system: Bilateral decreased breath sounds at bases Cardiovascular system: S1 & S2 heard, rate controlled Gastrointestinal system: Abdomen is nondistended, soft and nontender. Normal bowel sounds heard. Extremities: No cyanosis, clubbing; bilateral lower extremity dressing present   Data Reviewed: I have personally reviewed following labs and imaging studies  CBC: Recent Labs  Lab 09/03/17 1327 09/03/17 1934 09/05/17 0510 09/06/17 0448  WBC 7.4 7.4 7.9 6.7  NEUTROABS 5.3  --  5.1 3.9  HGB 10.9* 10.7* 9.5* 7.9*  HCT 33.8* 32.2* 29.4* 24.6*  MCV 82.2 81.1 82.1 81.5  PLT 393 379 337 290   Basic Metabolic Panel: Recent Labs  Lab 09/03/17 1327  09/03/17 1934 09/05/17 0510 09/06/17 0448  NA 134*  --  135 138  K 5.0  --  3.5 3.1*  CL 98*  --  98* 103  CO2 25  --  26 28  GLUCOSE 66  --  182* 143*  BUN 10  --  14 22*  CREATININE 0.38* 0.37* 0.46 0.41*  CALCIUM 8.7*  --  8.6* 8.1*  MG  --   --  1.6* 1.8   GFR: Estimated Creatinine Clearance: 54.4 mL/min (A) (by C-G formula based on SCr of 0.41 mg/dL (L)). Liver Function Tests: No results for input(s): AST, ALT, ALKPHOS, BILITOT, PROT, ALBUMIN in the last 168 hours. No results for input(s): LIPASE, AMYLASE in the last 168 hours. No results for input(s): AMMONIA in the last 168 hours. Coagulation Profile: No results for input(s): INR, PROTIME in the last 168 hours. Cardiac Enzymes: No results for input(s): CKTOTAL, CKMB, CKMBINDEX, TROPONINI in the last 168 hours. BNP (last 3 results) No results for input(s): PROBNP in the last 8760 hours. HbA1C: Recent Labs    09/03/17 1934 09/05/17 0510  HGBA1C 7.0* 6.9*   CBG: Recent Labs  Lab 09/05/17 0745 09/05/17 1121 09/05/17 1649 09/05/17 2103 09/06/17 0738  GLUCAP 192* 209* 130* 134* 172*   Lipid Profile: No results for input(s): CHOL, HDL, LDLCALC, TRIG, CHOLHDL, LDLDIRECT in the last 72 hours. Thyroid Function Tests: No results for input(s): TSH, T4TOTAL, FREET4, T3FREE, THYROIDAB in the last 72 hours. Anemia Panel: No results for input(s): VITAMINB12, FOLATE, FERRITIN, TIBC, IRON, RETICCTPCT in the last 72 hours. Sepsis Labs: Recent Labs  Lab 09/03/17 1437 09/03/17 1625  LATICACIDVEN 1.97* 1.10    Recent Results (from the past 240 hour(s))  Blood Cultures x 2 sites     Status: None (Preliminary result)   Collection Time: 09/03/17  7:40 PM  Result Value Ref Range Status   Specimen Description BLOOD RIGHT ANTECUBITAL  Final   Special Requests   Final    BOTTLES DRAWN AEROBIC AND ANAEROBIC Blood Culture adequate volume   Culture   Final    NO GROWTH 1 DAY Performed at [facility], [address] [place], [place] [address]    Report Status PENDING  Incomplete  Blood Cultures x 2 sites     Status: None (Preliminary result)   Collection Time: 09/03/17  7:42 PM  Result Value Ref Range Status   Specimen Description BLOOD LEFT FOREARM  Final   Special Requests IN PEDIATRIC BOTTLE Blood Culture adequate volume  Final   Culture   Final    NO GROWTH 1 DAY Performed at [facility], [address], [place], [place] [address]    Report Status PENDING  Incomplete         Radiology Studies: No results found.      Scheduled Meds: . diltiazem  30 mg Oral BID  . feeding supplement (ENSURE ENLIVE)  237 mL Oral BID BM  . heparin  5,000 Units Subcutaneous Q8H  . insulin aspart  0-5 Units Subcutaneous QHS  . insulin aspart  0-9 Units Subcutaneous TID WC  . irbesartan  300 mg Oral Daily  .  morphine injection  2 mg Intravenous Once  . multivitamin with minerals  1 tablet Oral Daily  Continuous Infusions: . piperacillin-tazobactam (ZOSYN)  IV 3.375 g (09/06/17 0600)  . vancomycin Stopped (09/05/17 3086)     LOS: 3 days        Glade Lloyd, MD [facility] Pager [phone]  If 7PM-7AM, please contact night-coverage [email or web address] Password [email or web address] 09/06/2017, 10:05 AM

## 2017-09-06 NOTE — Progress Notes (Signed)
Vascular: Second attempt made this am at [facility] (first attempt made, 12/24). Per patients family, please hold off on testing until they discuss course of treatment with other providers.    Levin Bacon- RDMS, RVT 9:16 AM  09/06/2017

## 2017-09-06 NOTE — Discharge Summary (Addendum)
Physician Discharge Summary  Hannah Lamb LOV:[id number] DOB: [birth year] DOA: 09/03/2017  PCP: Elwyn Reach, MD  Admit date: 09/03/2017 Discharge date: 09/06/2017  Admitted From: Home Disposition:  Home  Recommendations for Outpatient Follow-up:  1. Follow up with PCP in 1 week 2. Follow-up with Dr. Farley Ly at earliest convenience 3. Continue intravenous antibiotics with weekly labs until evaluation by Dr. Sharol Given  Home Health: Yes Equipment/Devices: None  Discharge Condition: Stable  CODE STATUS: DNR  Diet recommendation: Heart Healthy  Brief/Interim Summary: 76 year old female with history of MS, currently bedbound, hypertension, diabetes mellitus type 2, seizure history presented with right ankle wound and was found to have right calcaneal tuberosity osteomyelitis. Orthopedics was consulted.  Patient had bedside debridement by orthopedics.  Orthopedics recommended follow-up with Dr. Sharol Given for probable need for amputation.  Intravenous vancomycin and Zosyn was started.  Patient is hemodynamically stable and orthopedics has cleared the patient for discharge on IV antibiotics until evaluation by Dr. Sharol Given at earliest convenience.    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 Patient might need amputation. Awaiting evaluation by Dr. Sharol Given, inpatient/outpatient once Dr. Sharol Given is available. Currently on antibiotics as per orthopedics. - I had spoken to Dr. Janne Lab on phone on 09/04/2017 and he recommended that patient continue on intravenous antibiotics till evaluation by Dr. Sharol Given (Dr. Sharol Given will only be available for inpatient evaluation earliest by 09/09/2017). If patient remains stable, might consider sending the patient to nursing home on intravenous antibiotics and follow up with  Dr. Sharol Given as an outpatient as per Dr. Rolena Infante. Consulted Education officer, museum for the same. PICC line placed.  Patient/family members refused nursing home placement. - Currently hemodynamically stable. Wound dressing as per orthopedics - Discharge patient home on intravenous vancomycin and Zosyn with weekly labs.  Outpatient follow-up with Dr. Sharol Given as soon as it is available.  Type 2 diabetes with hypoglycemia - Outpatient follow-up.  Continue home regimen  Hypomagnesemia -Improved.   Hypokalemia -Outpatient follow-up  Hypertension, controlled -Continue home diltiazem and irbesartan  History of multiple sclerosis and bedbound status -Outpatient follow-up   Discharge Instructions  Discharge Instructions    Call MD for:  difficulty breathing, headache or visual disturbances   Complete by:  As directed    Call MD for:  extreme fatigue   Complete by:  As directed    Call MD for:  hives   Complete by:  As directed    Call MD for:  persistant dizziness or light-headedness   Complete by:  As directed    Call MD for:  persistant nausea and vomiting   Complete by:  As directed    Call MD for:  severe uncontrolled pain   Complete by:  As directed    Call MD for:  temperature >100.4   Complete by:  As directed    Diet - low sodium heart healthy   Complete by:  As directed    Home infusion instructions   Complete by:  As directed    Instructions:  Flushing of vascular access device: 0.9% NaCl pre/post medication administration and prn patency; Heparin 100 u/ml, 52m for implanted ports and Heparin 10u/ml, 578mfor all other central venous catheters.   Increase activity slowly   Complete by:  As directed      Allergies as of 09/06/2017  Reactions   Sulfa Drugs Cross Reactors Other (See Comments)   dizziness   Lipitor [atorvastatin] Other (See Comments)   Muscle weakness      Medication List    STOP taking these medications   metroNIDAZOLE 500 MG tablet Commonly known as:   FLAGYL   Rivaroxaban 15 & 20 MG Tbpk Commonly known as:  XARELTO STARTER PACK   rivaroxaban 20 MG Tabs tablet Commonly known as:  XARELTO     TAKE these medications   B-D INS SYRINGE 0.5CC/30GX1/2" 30G X 1/2" 0.5 ML Misc Generic drug:  Insulin Syringe-Needle U-100   diltiazem 60 MG 12 hr capsule Commonly known as:  CARDIZEM SR Take 1 capsule (60 mg total) by mouth daily.   feeding supplement (ENSURE ENLIVE) Liqd Take 237 mLs by mouth 2 (two) times daily between meals.   HUMULIN 70/30 (70-30) 100 UNIT/ML injection Generic drug:  insulin NPH-regular Human Inject 15 Units into the skin 2 (two) times daily with a meal.   ibuprofen 200 MG tablet Commonly known as:  ADVIL,MOTRIN Take 200 mg by mouth every 6 (six) hours as needed for mild pain.   irbesartan 300 MG tablet Commonly known as:  AVAPRO Take 1 tablet (300 mg total) by mouth daily.   levETIRAcetam 250 MG tablet Commonly known as:  KEPPRA Take 1 tablet (250 mg total) by mouth 2 (two) times daily.   oxyCODONE 5 MG immediate release tablet Commonly known as:  ROXICODONE Take 1 tablet (5 mg total) by mouth every 6 (six) hours as needed for moderate pain.   piperacillin-tazobactam IVPB Commonly known as:  ZOSYN Inject 3.375 g into the vein every 8 (eight) hours for 14 days. Indication:  osteomyelitis Last Day of Therapy:  pending surgical plans Labs - Once weekly:  CBC/D and BMP, Labs - Every other week:  ESR and CRP   potassium chloride 10 MEQ tablet Commonly known as:  K-DUR Take 10 mEq by mouth daily.   sodium hypochlorite 0.125 % Soln Commonly known as:  DAKIN'S 1/4 STRENGTH Irrigate with as directed 2 (two) times daily.   vancomycin IVPB Inject 1,000 mg into the vein daily for 14 days. Indication:  osteomyelitis Last Day of Therapy:  pending surgical plans Labs - Sunday/Monday:  CBC/D, BMP, and vancomycin trough. Labs - Thursday:  BMP and vancomycin trough Labs - Every other week:  ESR and CRP             Home Infusion Instuctions  (From admission, onward)        Start     Ordered   09/06/17 0000  Home infusion instructions    Question:  Instructions  Answer:  Flushing of vascular access device: 0.9% NaCl pre/post medication administration and prn patency; Heparin 100 u/ml, 72m for implanted ports and Heparin 10u/ml, 563mfor all other central venous catheters.   09/06/17 1207      Follow-up Information    GaElwyn ReachMD. Schedule an appointment as soon as possible for a visit in 1 week(s).   Specialty:  Internal Medicine Contact information: [phone]. [phone]      DuNewt MinionMD. Schedule an appointment as soon as possible for a visit.   Specialty:  Orthopedic Surgery Why:  at earliest convenience Contact information: [phone]          Allergies  Allergen Reactions  . Sulfa Drugs Cross Reactors Other (See Comments)  dizziness  . Lipitor [Atorvastatin] Other (See Comments)    Muscle weakness    Consultations:  Orthopedics   Procedures/Studies: Dg Foot Complete Left  Result Date: 09/03/2017 CLINICAL DATA:  Necrotic left foot ulcers concentrated at heel EXAM: LEFT FOOT - COMPLETE 3+ VIEW COMPARISON:  None. FINDINGS: There is a soft tissue ulcer around the dorsal margin of the healed that extends to the dorsal calcaneal tuberosity. There is no bone resorption to suggest osteomyelitis. No fracture. Joints are normally aligned. Bones are extensively demineralized. There is diffuse soft tissue swelling centered at the ankle and hindfoot. IMPRESSION: 1. No radiographic evidence of osteomyelitis. 2. Soft tissue ulcer along the dorsal margin of the healed. Electronically Signed   By: Lajean Manes M.D.   On: 09/03/2017 14:05   Dg Foot Complete Right  Result Date: 09/03/2017 CLINICAL DATA:  Necrotic right foot ulcers concentrated right heel EXAM: RIGHT FOOT  COMPLETE - 3+ VIEW COMPARISON:  None. FINDINGS: There is bone resorption along the calcaneal tuberosity consistent with osteomyelitis, associated with an overlying soft tissue ulcer. There are no other areas of bone resorption to suggest additional regions of osteomyelitis. No fracture. Joints are normally aligned. Bones are extensively demineralized. There is soft tissue swelling centered around the ankle and hindfoot. IMPRESSION: 1. Osteomyelitis of the calcaneal tuberosity. Electronically Signed   By: Lajean Manes M.D.   On: 09/03/2017 14:03     Subjective: Patient seen and examined at bedside. She is awake but does not answer much questions. No overnight fever or vomiting.  Discharge Exam: Vitals:   09/05/17 2147 09/06/17 0538  BP: 132/74 140/61  Pulse: 85 85  Resp: 18 18  Temp: 98 F (36.7 C) 99.7 F (37.6 C)  SpO2: 100% 100%   Vitals:   09/05/17 0446 09/05/17 1239 09/05/17 2147 09/06/17 0538  BP: (!) 155/86 (!) 117/59 132/74 140/61  Pulse: (!) 103 81 85 85  Resp: _0 Temp: 98.2 F (36.8 C) 97.9 F (36.6 C) 98 F (36.7 C) 99.7 F (37.6 C)  TempSrc: Oral Oral Axillary Oral  SpO2: 100% 100% 100% 100%  Weight:      Height:       General exam: Appears calm and comfortable.  Respiratory system: Bilateral decreased breath sounds at bases Cardiovascular system: S1 & S2 heard, rate controlled Gastrointestinal system: Abdomen is nondistended, soft and nontender. Normal bowel sounds heard. Extremities: No cyanosis, clubbing; bilateral lower extremity dressing present   The results of significant diagnostics from this hospitalization (including imaging, microbiology, ancillary and laboratory) are listed below for reference.     Microbiology: Recent Results (from the past 240 hour(s))  Blood Cultures x 2 sites     Status: None (Preliminary result)   Collection Time: 09/03/17  7:40 PM  Result Value Ref Range Status   Specimen Description BLOOD RIGHT ANTECUBITAL   Final   Special Requests   Final    BOTTLES DRAWN AEROBIC AND ANAEROBIC Blood Culture adequate volume   Culture   Final    NO GROWTH 1 DAY Performed at [facility], [address], [place], [place] [address]    Report Status PENDING  Incomplete  Blood Cultures x 2 sites     Status: None (Preliminary result)   Collection Time: 09/03/17  7:42 PM  Result Value Ref Range Status   Specimen Description BLOOD LEFT FOREARM  Final   Special Requests IN PEDIATRIC BOTTLE Blood Culture adequate volume  Final   Culture  Final    NO GROWTH 1 DAY Performed at [facility], [place] [address] [place], [place] [address]    Report Status PENDING  Incomplete     Labs: BNP (last 3 results) No results for input(s): BNP in the last 8760 hours. Basic Metabolic Panel: Recent Labs  Lab 09/03/17 1327 09/03/17 1934 09/05/17 0510 09/06/17 0448  NA 134*  --  135 138  K 5.0  --  3.5 3.1*  CL 98*  --  98* 103  CO2 25  --  26 28  GLUCOSE 66  --  182* 143*  BUN 10  --  14 22*  CREATININE 0.38* 0.37* 0.46 0.41*  CALCIUM 8.7*  --  8.6* 8.1*  MG  --   --  1.6* 1.8   Liver Function Tests: No results for input(s): AST, ALT, ALKPHOS, BILITOT, PROT, ALBUMIN in the last 168 hours. No results for input(s): LIPASE, AMYLASE in the last 168 hours. No results for input(s): AMMONIA in the last 168 hours. CBC: Recent Labs  Lab 09/03/17 1327 09/03/17 1934 09/05/17 0510 09/06/17 0448  WBC 7.4 7.4 7.9 6.7  NEUTROABS 5.3  --  5.1 3.9  HGB 10.9* 10.7* 9.5* 7.9*  HCT 33.8* 32.2* 29.4* 24.6*  MCV 82.2 81.1 82.1 81.5  PLT 393 379 337 290   Cardiac Enzymes: No results for input(s): CKTOTAL, CKMB, CKMBINDEX, TROPONINI in the last 168 hours. BNP: Invalid input(s): POCBNP CBG: Recent Labs  Lab 09/05/17 1121 09/05/17 1649 09/05/17 2103 09/06/17 0738 09/06/17 1128  GLUCAP 209* 130* 134* 172* 192*   D-Dimer No results for input(s): DDIMER in the last 72 hours. Hgb A1c Recent Labs     09/03/17 1934 09/05/17 0510  HGBA1C 7.0* 6.9*   Lipid Profile No results for input(s): CHOL, HDL, LDLCALC, TRIG, CHOLHDL, LDLDIRECT in the last 72 hours. Thyroid function studies No results for input(s): TSH, T4TOTAL, T3FREE, THYROIDAB in the last 72 hours.  Invalid input(s): FREET3 Anemia work up No results for input(s): VITAMINB12, FOLATE, FERRITIN, TIBC, IRON, RETICCTPCT in the last 72 hours. Urinalysis    Component Value Date/Time   COLORURINE YELLOW 07/15/2015 1549   APPEARANCEUR TURBID (A) 07/15/2015 1549   LABSPEC 1.012 07/15/2015 1549   PHURINE 6.0 07/15/2015 1549   GLUCOSEU 100 (A) 07/15/2015 1549   HGBUR SMALL (A) 07/15/2015 1549   BILIRUBINUR NEGATIVE 07/15/2015 1549   KETONESUR NEGATIVE 07/15/2015 1549   PROTEINUR 30 (A) 07/15/2015 1549   UROBILINOGEN 1.0 07/15/2015 1549   NITRITE NEGATIVE 07/15/2015 1549   LEUKOCYTESUR NEGATIVE 07/15/2015 1549   Sepsis Labs Invalid input(s): PROCALCITONIN,  WBC,  LACTICIDVEN Microbiology Recent Results (from the past 240 hour(s))  Blood Cultures x 2 sites     Status: None (Preliminary result)   Collection Time: 09/03/17  7:40 PM  Result Value Ref Range Status   Specimen Description BLOOD RIGHT ANTECUBITAL  Final   Special Requests   Final    BOTTLES DRAWN AEROBIC AND ANAEROBIC Blood Culture adequate volume   Culture   Final    NO GROWTH 1 DAY Performed at [facility], [place] [address] [place], [place] [address]    Report Status PENDING  Incomplete  Blood Cultures x 2 sites     Status: None (Preliminary result)   Collection Time: 09/03/17  7:42 PM  Result Value Ref Range Status   Specimen Description BLOOD LEFT FOREARM  Final   Special Requests IN PEDIATRIC BOTTLE Blood Culture adequate volume  Final  Culture   Final    NO GROWTH 1 DAY Performed at [facility], [place] [address] [place], [place] [address]    Report Status PENDING  Incomplete     Time coordinating discharge: 35   minutes  SIGNED:   Aline August, MD  [facility] 09/06/2017, 12:08 PM Pager: [phone]  If 7PM-7AM, please contact night-coverage [email or web address] Password [email or web address]

## 2017-09-06 NOTE — Progress Notes (Signed)
Notified by CSW that patient declined SNF and wants to d/c home with IV abx. Spoke with patient and daughter in room, no preference for [facility] agency. Contacted [facility] for referral for [facility], contacted attending to make him aware and obtain HH orders. Picc line in place. Will need [facility] to transport. Plan to f/u with ortho as OP. [phone]

## 2017-09-06 NOTE — Discharge Instructions (Signed)

## 2017-09-07 ENCOUNTER — Telehealth (INDEPENDENT_AMBULATORY_CARE_PROVIDER_SITE_OTHER): Payer: Self-pay | Admitting: Orthopedic Surgery

## 2017-09-07 NOTE — Telephone Encounter (Signed)
Per Dr. Lajoyce Corners, Ms. Moncion needs an appointment in our clinic for Monday 09/11/2017.  She needs evaluation of ulcers and discussion of possible surgery next week.  I left a message for Ms. Washabaugh's daughter to please return my call to schedule Monday's appointment.

## 2017-09-08 ENCOUNTER — Telehealth (INDEPENDENT_AMBULATORY_CARE_PROVIDER_SITE_OTHER): Payer: Self-pay | Admitting: Orthopedic Surgery

## 2017-09-08 NOTE — Telephone Encounter (Signed)
I left a voicemail yesterday for Ms. Hinderliter's daughter to call me back to discuss bringing her in for an appointment on Monday 09/11/2017.  I did not receive a return call, so I called again this afternoon and spoke with Ms. York daughter, who is also named Development worker, community.  Advised that Dr. Lajoyce Corners felt she needed to be seen Monday and be scheduled for possible surgical procedure on Wednesday or Friday next week.  Her daughter stated that "It's probably not going to be Monday because she is bed ridden".  She then asked that we let them "think about it for a while" and she will call back to schedule.

## 2017-09-09 LAB — CULTURE, BLOOD (ROUTINE X 2)
CULTURE: NO GROWTH
Culture: NO GROWTH
SPECIAL REQUESTS: ADEQUATE
Special Requests: ADEQUATE

## 2017-09-10 ENCOUNTER — Other Ambulatory Visit (HOSPITAL_COMMUNITY)
Admission: AD | Admit: 2017-09-10 | Discharge: 2017-09-10 | Disposition: A | Discharge status: Home / Self Care | Payer: Medicare Other | Source: Other Acute Inpatient Hospital | Attending: Internal Medicine | Admitting: Internal Medicine

## 2017-09-10 DIAGNOSIS — Z5181 Encounter for therapeutic drug level monitoring: Secondary | ICD-10-CM | POA: Insufficient documentation

## 2017-09-10 LAB — BASIC METABOLIC PANEL
Anion gap: 11 (ref 5–15)
BUN: 10 mg/dL (ref 6–20)
CO2: 26 mmol/L (ref 22–32)
CREATININE: 0.4 mg/dL — AB (ref 0.44–1.00)
Calcium: 8.5 mg/dL — ABNORMAL LOW (ref 8.9–10.3)
Chloride: 102 mmol/L (ref 101–111)
Glucose, Bld: 156 mg/dL — ABNORMAL HIGH (ref 65–99)
POTASSIUM: 2.8 mmol/L — AB (ref 3.5–5.1)
SODIUM: 139 mmol/L (ref 135–145)

## 2017-09-10 LAB — CBC WITH DIFFERENTIAL/PLATELET
BASOS ABS: 0 10*3/uL (ref 0.0–0.1)
Basophils Relative: 1 %
EOS ABS: 0.3 10*3/uL (ref 0.0–0.7)
EOS PCT: 5 %
HCT: 30.4 % — ABNORMAL LOW (ref 36.0–46.0)
Hemoglobin: 9.5 g/dL — ABNORMAL LOW (ref 12.0–15.0)
Lymphocytes Relative: 22 %
Lymphs Abs: 1.4 10*3/uL (ref 0.7–4.0)
MCH: 26.4 pg (ref 26.0–34.0)
MCHC: 31.3 g/dL (ref 30.0–36.0)
MCV: 84.4 fL (ref 78.0–100.0)
Monocytes Absolute: 0.4 10*3/uL (ref 0.1–1.0)
Monocytes Relative: 7 %
NEUTROS PCT: 67 %
Neutro Abs: 4.3 10*3/uL (ref 1.7–7.7)
PLATELETS: 371 10*3/uL (ref 150–400)
RBC: 3.6 MIL/uL — AB (ref 3.87–5.11)
RDW: 16.3 % — ABNORMAL HIGH (ref 11.5–15.5)
WBC: 6.5 10*3/uL (ref 4.0–10.5)

## 2017-09-10 LAB — SEDIMENTATION RATE: SED RATE: 120 mm/h — AB (ref 0–22)

## 2017-09-10 LAB — VANCOMYCIN, TROUGH: VANCOMYCIN TR: 5 ug/mL — AB (ref 15–20)

## 2017-09-11 LAB — C-REACTIVE PROTEIN: CRP: 2.6 mg/dL — AB (ref <1.0)

## 2017-09-15 ENCOUNTER — Telehealth (INDEPENDENT_AMBULATORY_CARE_PROVIDER_SITE_OTHER): Payer: Self-pay | Admitting: Orthopedic Surgery

## 2017-09-15 NOTE — Telephone Encounter (Signed)
Ms. Lamanna was advised upon discharge from the hospital to follow up with Dr. Lajoyce Corners for her osteomyelitis/heel ulcers.  I called last week and spoke with her daughter, [place], who stated that the family was still thinking about what to do.  I called back today to again see if I could make Ms. Marzo's an appointment.  Her daughter advised me that they still have not decided if patient will be coming in to see Dr. Lajoyce Corners.  I asked about patient's wound care in the meantime and she stated that they planned on following up with her PCP next week to discuss.

## 2017-09-21 ENCOUNTER — Ambulatory Visit (INDEPENDENT_AMBULATORY_CARE_PROVIDER_SITE_OTHER): Payer: Medicare Other | Admitting: Orthopedic Surgery

## 2017-09-21 ENCOUNTER — Encounter (INDEPENDENT_AMBULATORY_CARE_PROVIDER_SITE_OTHER): Payer: Self-pay | Admitting: Orthopedic Surgery

## 2017-09-21 DIAGNOSIS — I70263 Atherosclerosis of native arteries of extremities with gangrene, bilateral legs: Secondary | ICD-10-CM | POA: Diagnosis not present

## 2017-09-21 NOTE — Progress Notes (Signed)
Office Visit Note   Patient: Hannah Lamb           Date of Birth: 1941-04-30           MRN: [id number] Visit Date: 09/21/2017              Requested by: Rometta Emery, MD [id number] G. [address]  [place], [place] [address] PCP: Rometta Emery, MD  No chief complaint on file.     HPI: Patient is a 77 year old woman with paralysis of both lower extremities lower extremity pain with massive ulcerations of the Achilles and heel bilaterally.  Patient is total assistance for transfers she does not weight-bear she has no active movement of either lower extremity.  Patient is just completed a course of antibiotics.  Assessment & Plan: Visit Diagnoses:  1. 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  Discussed that minimal surgery would be  bilateral transtibial amputation and that she may require an above-the-knee amputation.  Discussed with the wound being open no ascending cellulitis that continued wound care and pressure unloading is an option.  Discussed that if patient develops any systemic symptoms fever chills   Follow-Up Instructions: Return if symptoms worsen or fail to improve.   Ortho Exam  Patient is alert, oriented, no adenopathy, well-dressed, normal affect, normal respiratory effort. Patient is nonambulatory she is in a stretcher.  Her heels are floated off the bed.  She has no active motor strength of either lower extremity.  There is no cellulitis going up her legs.  A Doppler was used and patient has a good dorsalis pedis biphasic pulse.  Examination of both heels she has large necrotic wounds of both heels there is good granulation tissue around the wound edges there is exposed calcaneus with chronic  Imaging: No results found. No images are attached to the encounter.  Labs: Lab Results  Component Value Date   HGBA1C 6.9 (H) 09/05/2017   HGBA1C 7.0 (H)  09/03/2017   HGBA1C 7.1 (H) 11/18/2014   ESRSEDRATE 120 (H) 09/10/2017   ESRSEDRATE 95 (H) 09/03/2017   CRP 2.6 (H) 09/10/2017   CRP 7.4 (H) 09/06/2017   CRP 6.9 (H) 09/03/2017   REPTSTATUS 09/09/2017 FINAL 09/03/2017   CULT  09/03/2017    NO GROWTH 5 DAYS Performed at [facility], [address] [place], [place] [address]    LABORGA PROTEUS MIRABILIS 01/06/2013    @LABSALLVALUES (HGBA1)@  There is no height or weight on file to calculate BMI.  Orders:  No orders of the defined types were placed in this encounter.  No orders of the defined types were placed in this encounter.    Procedures: No procedures performed  Clinical Data: No additional findings.  ROS:  All other systems negative, except as noted in the HPI. Review of Systems  Objective: Vital Signs: There were no vitals taken for this visit.  Specialty Comments:  No specialty comments available.  PMFS History: Patient Active Problem List   Diagnosis Date Noted  . 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 Pressure injury of skin 09/06/2017  . Osteomyelitis of ankle (HCC) 09/03/2017  . Decubitus ulcer of buttock, stage 2 07/15/2015  . Vaginal discharge 07/15/2015  . Seizure (HCC) 11/18/2014  . Cholecystitis 10/20/2014  . Hypertension 10/20/2014  . Diabetes mellitus type 2, controlled (HCC) 10/20/2014  .  Hypokalemia 10/20/2014  . Acute cholecystitis 10/20/2014  . Syncope 07/04/2014  . Shoulder pain, acute/right 01/07/2013  . UTI (urinary tract infection) 01/07/2013  . Malignant hypertension 01/06/2013  . History of CVA (cerebrovascular accident) 02/21/2012  . Abnormal EKG 02/16/2012  . Weakness generalized 02/15/2012  . Hypertensive emergency 02/15/2012  . Multiple sclerosis (HCC) 02/15/2012  . HYPERCHOLESTEROLEMIA 11/09/2006  . HYPERTENSION, BENIGN SYSTEMIC 11/09/2006  . ARTHRITIS 11/09/2006   Past Medical History:  Diagnosis Date  .  Anxiety   . Bilateral chronic knee pain   . Diabetes mellitus   . Goiter   . Hyperlipidemia   . Hypertension   . MS (multiple sclerosis) (HCC)   . Muscular dystrophy   . Myocardial infarction (HCC)   . Stroke (HCC)   . Syncope    recurrent    Family History  Problem Relation Age of Onset  . Diabetes Father     Past Surgical History:  Procedure Laterality Date  . ABDOMINAL HYSTERECTOMY    . CHOLECYSTECTOMY N/A 10/23/2014   Procedure: LAPAROSCOPIC CHOLECYSTECTOMY WITH INTRAOPERATIVE CHOLANGIOGRAM;  Surgeon: Harriette Bouillon, MD;  Location: [facility] OR;  Service: General;  Laterality: N/A;   Social History   Occupational History  . Not on file  Tobacco Use  . Smoking status: Never Smoker  . Smokeless tobacco: Never Used  Substance and Sexual Activity  . Alcohol use: No  . Drug use: No  . Sexual activity: No

## 2018-11-11 DEATH — deceased

## 2019-10-25 IMAGING — CR DG FOOT COMPLETE 3+V*R*
3 series · 3 of 3 positions shown · non-contrast
Comparison: None.

CLINICAL DATA: Necrotic right foot ulcers concentrated right heel

EXAM:
RIGHT FOOT COMPLETE - 3+ VIEW

[x foot ap right]
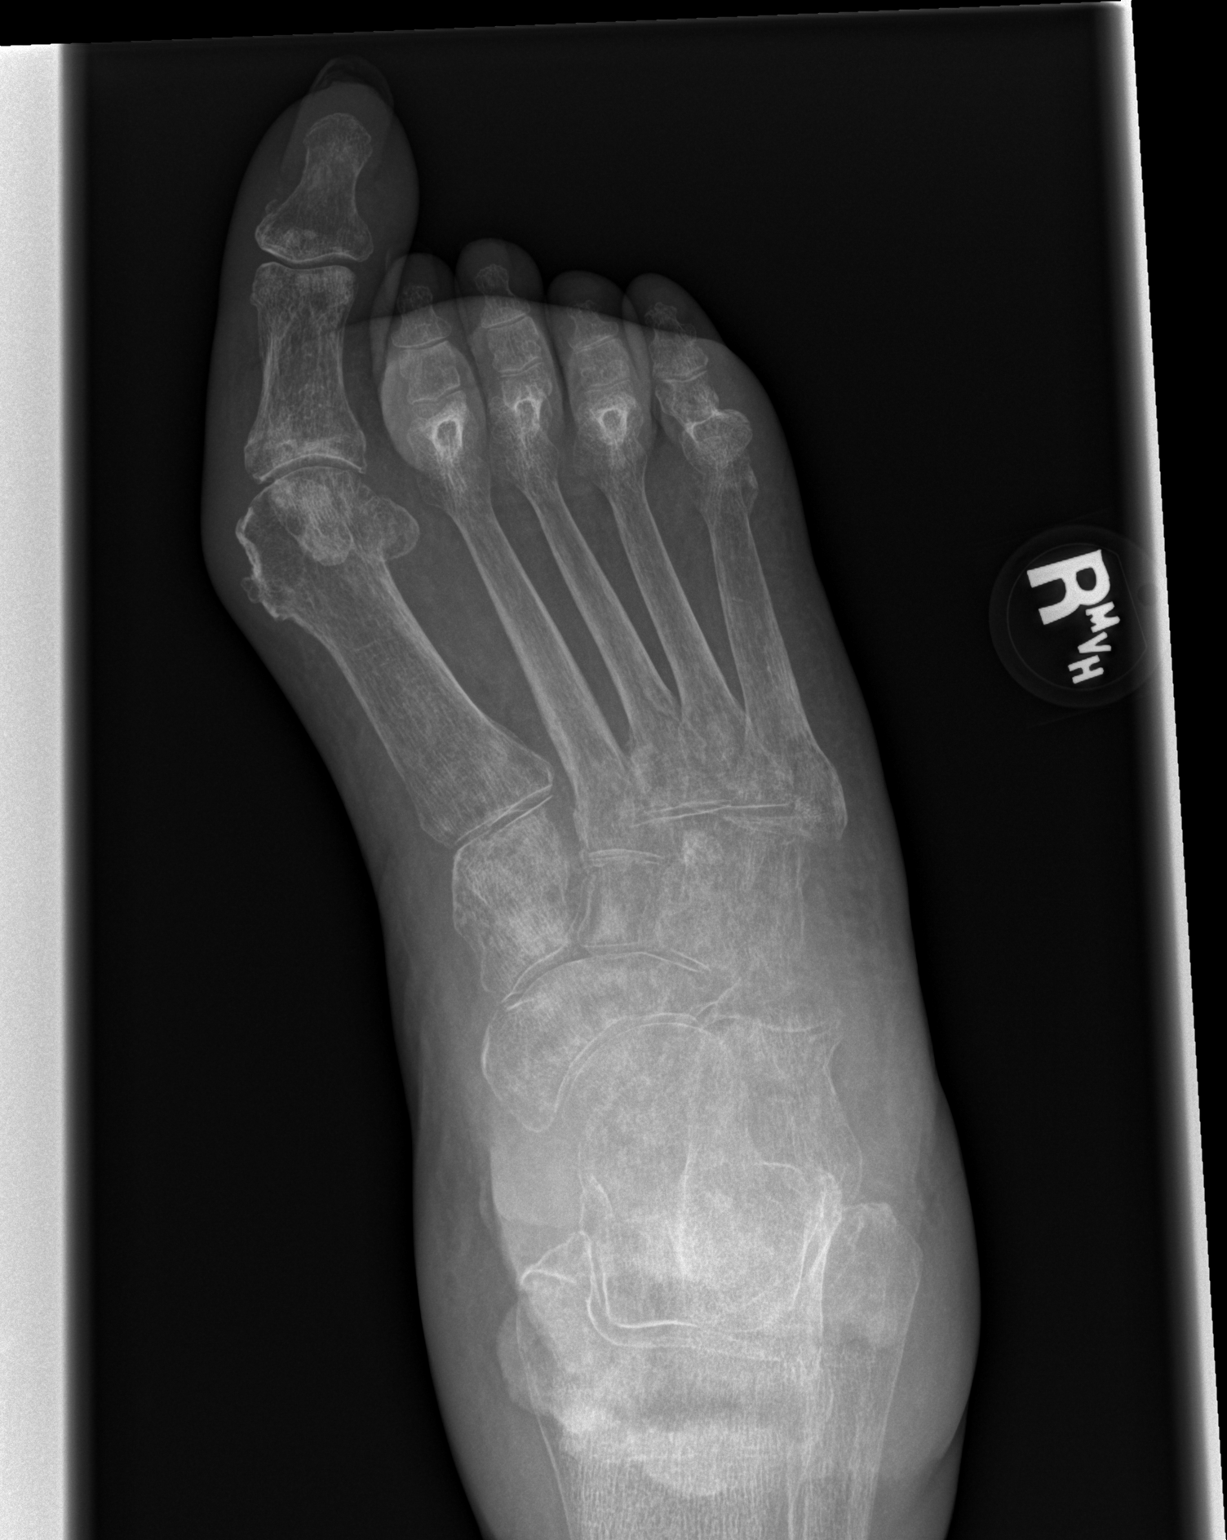

[x foot obl right]
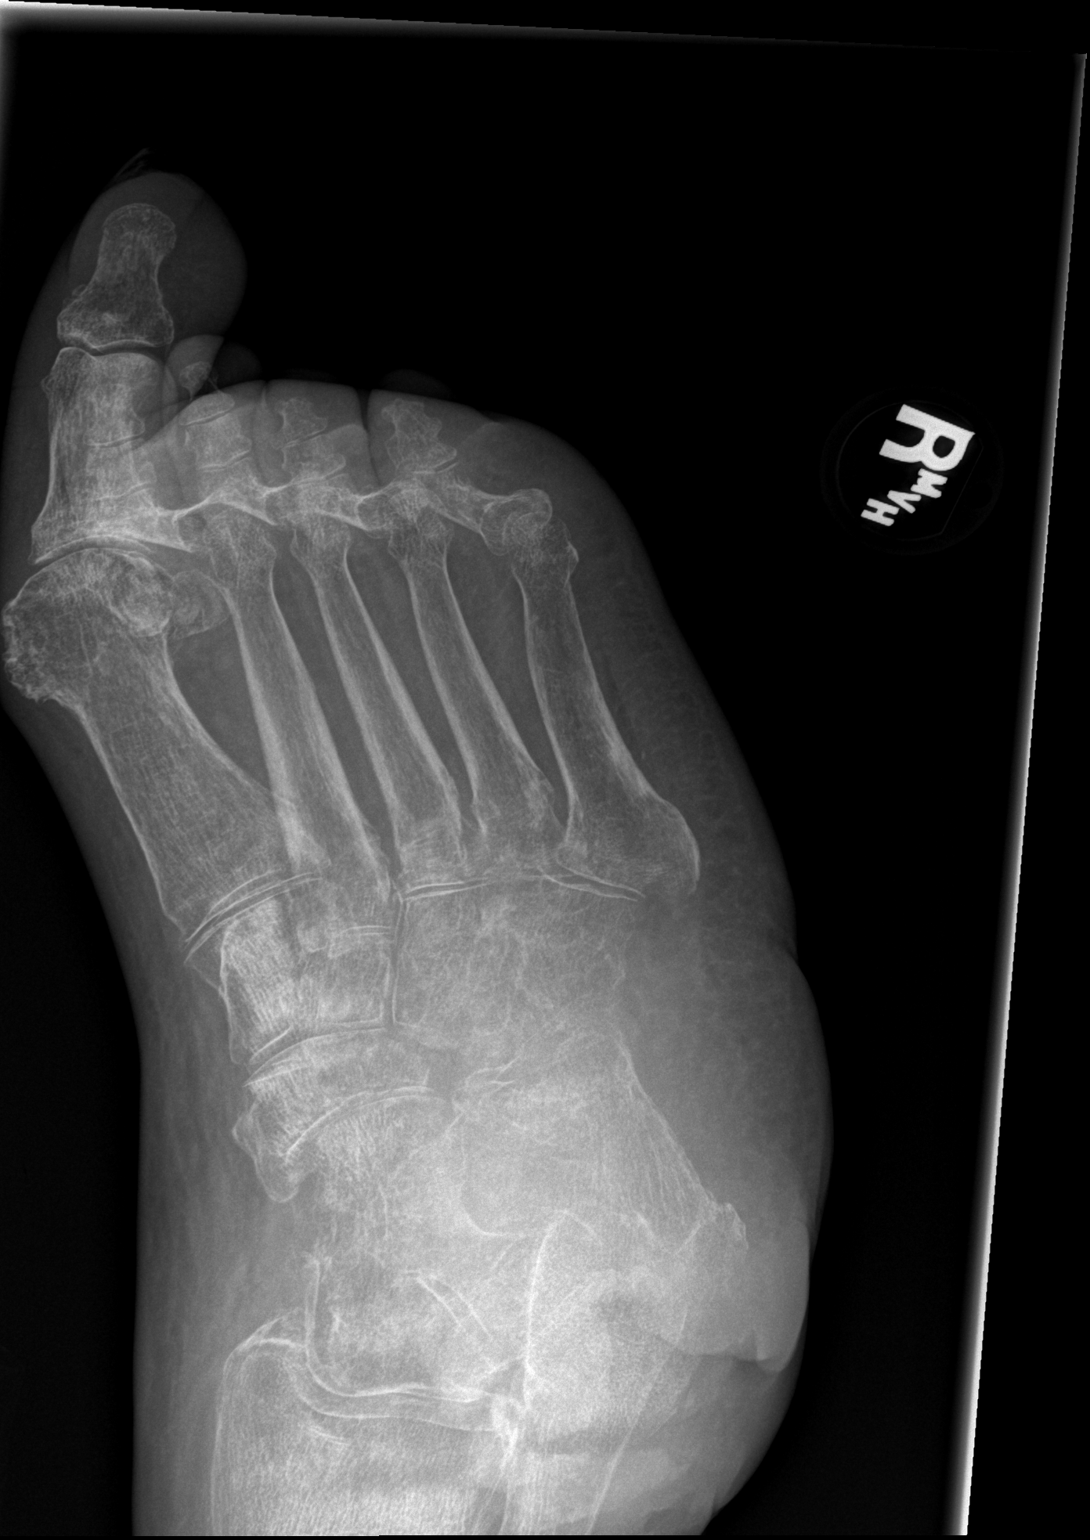

[x foot lat right]
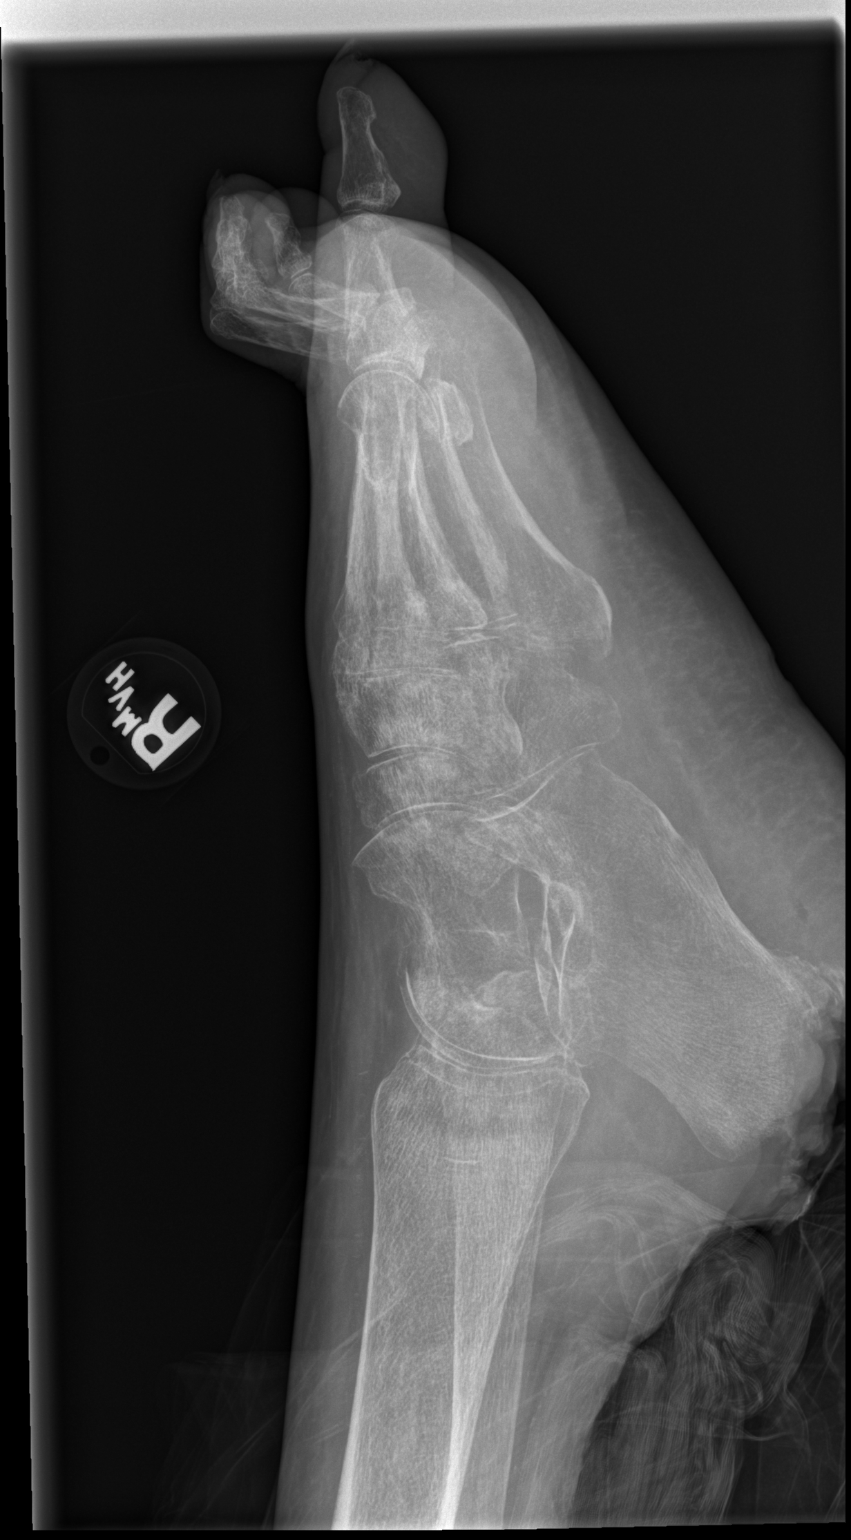

[3 of 3 positions shown; findings below may reference images not displayed]

FINDINGS: There is bone resorption along the calcaneal tuberosity consistent
with osteomyelitis, associated with an overlying soft tissue ulcer.

There are no other areas of bone resorption to suggest additional
regions of osteomyelitis.

No fracture.

Joints are normally aligned.

Bones are extensively demineralized.

There is soft tissue swelling centered around the ankle and
hindfoot.
IMPRESSION: 1. Osteomyelitis of the calcaneal tuberosity.
